# Patient Record
Sex: Female | Born: 1935 | Race: Black or African American | Hispanic: No | Smoking: Never smoker
Health system: Southern US, Community
[De-identification: ages and names within clinical notes are randomized; demographics above are authoritative.]

## PROBLEM LIST (undated history)

## (undated) DIAGNOSIS — I10 Essential (primary) hypertension: Secondary | ICD-10-CM

## (undated) DIAGNOSIS — K5900 Constipation, unspecified: Secondary | ICD-10-CM

## (undated) DIAGNOSIS — E119 Type 2 diabetes mellitus without complications: Secondary | ICD-10-CM

## (undated) DIAGNOSIS — F0390 Unspecified dementia without behavioral disturbance: Secondary | ICD-10-CM

## (undated) HISTORY — PX: CHOLECYSTECTOMY: SHX55

## (undated) HISTORY — PX: ABDOMINAL HYSTERECTOMY: SHX81

## (undated) HISTORY — PX: APPENDECTOMY: SHX54

---

## 1998-06-26 ENCOUNTER — Ambulatory Visit (HOSPITAL_COMMUNITY): Admission: RE | Admit: 1998-06-26 | Discharge: 1998-06-26 | Payer: Self-pay | Admitting: Family Medicine

## 1999-05-17 ENCOUNTER — Other Ambulatory Visit: Admission: RE | Admit: 1999-05-17 | Discharge: 1999-05-17 | Payer: Self-pay | Admitting: Gastroenterology

## 1999-05-17 ENCOUNTER — Encounter (INDEPENDENT_AMBULATORY_CARE_PROVIDER_SITE_OTHER): Payer: Self-pay | Admitting: Specialist

## 1999-07-15 ENCOUNTER — Ambulatory Visit (HOSPITAL_COMMUNITY): Admission: RE | Admit: 1999-07-15 | Discharge: 1999-07-15 | Payer: Self-pay | Admitting: Family Medicine

## 1999-07-15 ENCOUNTER — Encounter: Payer: Self-pay | Admitting: Family Medicine

## 2000-08-17 ENCOUNTER — Ambulatory Visit (HOSPITAL_COMMUNITY): Admission: RE | Admit: 2000-08-17 | Discharge: 2000-08-17 | Payer: Self-pay | Admitting: Family Medicine

## 2000-08-17 ENCOUNTER — Encounter: Payer: Self-pay | Admitting: Family Medicine

## 2001-05-29 ENCOUNTER — Emergency Department (HOSPITAL_COMMUNITY): Admission: EM | Admit: 2001-05-29 | Discharge: 2001-05-29 | Payer: Self-pay | Admitting: *Deleted

## 2001-12-20 ENCOUNTER — Ambulatory Visit (HOSPITAL_COMMUNITY): Admission: RE | Admit: 2001-12-20 | Discharge: 2001-12-20 | Payer: Self-pay | Admitting: Family Medicine

## 2001-12-20 ENCOUNTER — Encounter: Payer: Self-pay | Admitting: Family Medicine

## 2003-01-10 ENCOUNTER — Ambulatory Visit (HOSPITAL_COMMUNITY): Admission: RE | Admit: 2003-01-10 | Discharge: 2003-01-10 | Payer: Self-pay | Admitting: Family Medicine

## 2003-01-10 ENCOUNTER — Encounter: Payer: Self-pay | Admitting: Family Medicine

## 2004-04-28 ENCOUNTER — Ambulatory Visit (HOSPITAL_COMMUNITY): Admission: RE | Admit: 2004-04-28 | Discharge: 2004-04-28 | Payer: Self-pay | Admitting: Family Medicine

## 2004-09-29 ENCOUNTER — Ambulatory Visit: Payer: Self-pay

## 2004-10-27 ENCOUNTER — Ambulatory Visit: Payer: Self-pay

## 2005-05-16 ENCOUNTER — Ambulatory Visit (HOSPITAL_COMMUNITY): Admission: RE | Admit: 2005-05-16 | Discharge: 2005-05-16 | Payer: Self-pay | Admitting: *Deleted

## 2006-05-18 ENCOUNTER — Ambulatory Visit (HOSPITAL_COMMUNITY): Admission: RE | Admit: 2006-05-18 | Discharge: 2006-05-18 | Payer: Self-pay | Admitting: Podiatry

## 2007-05-21 ENCOUNTER — Ambulatory Visit (HOSPITAL_COMMUNITY): Admission: RE | Admit: 2007-05-21 | Discharge: 2007-05-21 | Payer: Self-pay | Admitting: Podiatry

## 2008-07-15 ENCOUNTER — Ambulatory Visit: Payer: Self-pay | Admitting: Gastroenterology

## 2008-07-22 ENCOUNTER — Ambulatory Visit (HOSPITAL_COMMUNITY): Admission: RE | Admit: 2008-07-22 | Discharge: 2008-07-22 | Payer: Self-pay | Admitting: Family Medicine

## 2008-09-03 ENCOUNTER — Emergency Department (HOSPITAL_COMMUNITY): Admission: EM | Admit: 2008-09-03 | Discharge: 2008-09-03 | Payer: Self-pay | Admitting: Emergency Medicine

## 2010-10-05 ENCOUNTER — Emergency Department (HOSPITAL_COMMUNITY): Admission: EM | Admit: 2010-10-05 | Discharge: 2010-10-05 | Payer: Self-pay | Admitting: Emergency Medicine

## 2010-10-10 ENCOUNTER — Emergency Department: Payer: Self-pay | Admitting: Unknown Physician Specialty

## 2010-12-12 ENCOUNTER — Encounter: Payer: Self-pay | Admitting: Family Medicine

## 2011-03-01 ENCOUNTER — Ambulatory Visit: Payer: Self-pay | Admitting: Family Medicine

## 2011-03-11 ENCOUNTER — Ambulatory Visit: Payer: Self-pay | Admitting: Family Medicine

## 2013-05-11 ENCOUNTER — Emergency Department (HOSPITAL_COMMUNITY)
Admission: EM | Admit: 2013-05-11 | Discharge: 2013-05-11 | Disposition: A | Discharge status: Home / Self Care | Payer: Medicare PPO | Attending: Emergency Medicine | Admitting: Emergency Medicine

## 2013-05-11 ENCOUNTER — Encounter (HOSPITAL_COMMUNITY): Payer: Self-pay | Admitting: *Deleted

## 2013-05-11 ENCOUNTER — Emergency Department (HOSPITAL_COMMUNITY): Payer: Medicare PPO

## 2013-05-11 DIAGNOSIS — Z7982 Long term (current) use of aspirin: Secondary | ICD-10-CM | POA: Insufficient documentation

## 2013-05-11 DIAGNOSIS — E860 Dehydration: Secondary | ICD-10-CM | POA: Insufficient documentation

## 2013-05-11 DIAGNOSIS — R4182 Altered mental status, unspecified: Secondary | ICD-10-CM | POA: Insufficient documentation

## 2013-05-11 DIAGNOSIS — E119 Type 2 diabetes mellitus without complications: Secondary | ICD-10-CM | POA: Insufficient documentation

## 2013-05-11 DIAGNOSIS — I10 Essential (primary) hypertension: Secondary | ICD-10-CM | POA: Insufficient documentation

## 2013-05-11 DIAGNOSIS — R42 Dizziness and giddiness: Secondary | ICD-10-CM

## 2013-05-11 DIAGNOSIS — Z79899 Other long term (current) drug therapy: Secondary | ICD-10-CM | POA: Insufficient documentation

## 2013-05-11 DIAGNOSIS — R55 Syncope and collapse: Secondary | ICD-10-CM | POA: Insufficient documentation

## 2013-05-11 DIAGNOSIS — M542 Cervicalgia: Secondary | ICD-10-CM | POA: Insufficient documentation

## 2013-05-11 DIAGNOSIS — I951 Orthostatic hypotension: Secondary | ICD-10-CM | POA: Insufficient documentation

## 2013-05-11 DIAGNOSIS — M5030 Other cervical disc degeneration, unspecified cervical region: Secondary | ICD-10-CM | POA: Insufficient documentation

## 2013-05-11 DIAGNOSIS — M47812 Spondylosis without myelopathy or radiculopathy, cervical region: Secondary | ICD-10-CM

## 2013-05-11 DIAGNOSIS — R0789 Other chest pain: Secondary | ICD-10-CM | POA: Insufficient documentation

## 2013-05-11 DIAGNOSIS — Z8659 Personal history of other mental and behavioral disorders: Secondary | ICD-10-CM | POA: Insufficient documentation

## 2013-05-11 HISTORY — DX: Type 2 diabetes mellitus without complications: E11.9

## 2013-05-11 HISTORY — DX: Essential (primary) hypertension: I10

## 2013-05-11 HISTORY — DX: Unspecified dementia, unspecified severity, without behavioral disturbance, psychotic disturbance, mood disturbance, and anxiety: F03.90

## 2013-05-11 LAB — URINALYSIS, ROUTINE W REFLEX MICROSCOPIC
Bilirubin Urine: NEGATIVE
Hgb urine dipstick: NEGATIVE
Nitrite: NEGATIVE
Urobilinogen, UA: 0.2 mg/dL (ref 0.0–1.0)
pH: 7 (ref 5.0–8.0)

## 2013-05-11 LAB — CBC WITH DIFFERENTIAL/PLATELET
Basophils Absolute: 0 10*3/uL (ref 0.0–0.1)
Hemoglobin: 11.6 g/dL — ABNORMAL LOW (ref 12.0–15.0)
Lymphocytes Relative: 24 % (ref 12–46)
MCHC: 31.8 g/dL (ref 30.0–36.0)
MCV: 80 fL (ref 78.0–100.0)
Monocytes Relative: 6 % (ref 3–12)
Neutro Abs: 6.4 10*3/uL (ref 1.7–7.7)
Neutrophils Relative %: 68 % (ref 43–77)
Platelets: 296 10*3/uL (ref 150–400)
RDW: 14.9 % (ref 11.5–15.5)

## 2013-05-11 LAB — BASIC METABOLIC PANEL
CO2: 28 mEq/L (ref 19–32)
Creatinine, Ser: 0.69 mg/dL (ref 0.50–1.10)

## 2013-05-11 LAB — TROPONIN I: Troponin I: <0.3 ng/mL (ref <0.30)

## 2013-05-11 MED ORDER — SODIUM CHLORIDE 0.9 % IV BOLUS (SEPSIS)
1000.0000 mL | Freq: Once | INTRAVENOUS | Status: AC
  Administered 2013-05-11: 1000 mL via INTRAVENOUS

## 2013-05-11 NOTE — ED Provider Notes (Signed)
History    This chart was scribed for Ward Givens, MD, by Frederik Pear, ED scribe. The patient was seen in room APA16A/APA16A and the patient's care was started at 1459.    CSN: [id number]  Arrival date & time 05/11/13  1422   First MD Initiated Contact with Patient 05/11/13 1459      Chief Complaint  Patient presents with  . Dizziness  . Altered Mental Status    (Consider location/radiation/quality/duration/timing/severity/associated sxs/prior treatment) The history is provided by the patient and medical records. No language interpreter was used.   LEVEL 5 CAVEAT FOR DEMENTIA  HPI Comments: Ashlee Barron is a 79 y.o. female with a h/o of mild dementia who presents to the Emergency Department complaining of altered mental that has been present for the last few days. She states that her mother has been asking her questions such as "Have you seen [place], he hasn't come home?" and "It's not like him not to come home." [place] is her husband who has been deceased for 10 years. She reports that she brought her to the ED today after she got out of the shower and found her mother sprawled out on the couch complaining of dizziness and central CP. She stated that she felt like the was going to pass out. She reports that she was not diaphoretic at the time, but seemed lethargic and only appeared to be looking up at the ceiling. She was concerned so she ran across the street to get her aunt, who is a retired Engineer, civil (consulting), and she took BP and CBG. Both were normal. She states that she doesn't feel that her mother seemed to be herself until they arrived in the ED, and she has been gradually improving since their arrival. In ED, the pt complains of constant neck pain that began 2 weeks ago.PT does not recall these events.  She denies falling or hitting her head. She denies complaining previously about CP. She reports a h/o of indigestion.  Patient also has been complaining of pain in her neck for the past 2  weeks. He denies any falling. Patient seems to think is from sleeping on her pillow.  Her daughter states that she has an brain MRI of her brain in 2 days that was scheduled by her neurologist Dr. Malvin Johns in [place] to analyze her specific type of dementia.   She is a nonsmoker who does not consume ETOH. 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 Jola Babinski in [place].  Past Medical History  Diagnosis Date  . Dementia   . Hypertension   . Diabetes mellitus without complication     Past Surgical History  Procedure Laterality Date  . Abdominal hysterectomy    . Cholecystectomy    . Appendectomy      No family history on file.  History  Substance Use Topics  . Smoking status: Never Smoker   . Smokeless tobacco: Not on file  . Alcohol Use: No  lives at home Lives with daughter Retired Licensed conveyancer in [place]  OB History   Grav Para Term Preterm Abortions TAB SAB Ect Mult Living                  Review of Systems  Neurological:       Altered Mental Status  All other systems reviewed and are negative.    Allergies  Review of patient's allergies indicates no known allergies.  Home Medications  Current Outpatient Rx  Name  Route  Sig  Dispense  Refill  . amLODipine-benazepril (LOTREL) 5-20 MG per capsule   Oral   Take 1 capsule by mouth daily.         Marland Kitchen aspirin EC 81 MG tablet   Oral   Take 81 mg by mouth daily.         Marland Kitchen atorvastatin (LIPITOR) 40 MG tablet   Oral   Take 40 mg by mouth daily.         Marland Kitchen donepezil (ARICEPT) 10 MG tablet   Oral   Take 10 mg by mouth at bedtime.         . mirtazapine (REMERON) 15 MG tablet   Oral   Take 15 mg by mouth at bedtime.         . Multiple Vitamin (MULTIVITAMIN) tablet   Oral   Take 1 tablet by mouth daily.         . sitaGLIPtan-metformin (JANUMET) 50-500 MG per tablet   Oral   Take 1 tablet by mouth 2 (two) times daily with a meal.            BP 135/83  Pulse 91  Temp(Src) 97.1 F (36.2 C) (Oral)  Resp 18  Ht 5\' 6"  (1.676 m)  Wt 159 lb (72.122 kg)  BMI 25.68 kg/m2  SpO2 96%  Vital signs normal   Orthostatic VS showed her blood pressure dropped to 90/51 with heart rate 105 on sitting and when she stood up her pulse jumped to 123 and blood pressure was 116/83   Physical Exam  Nursing note and vitals reviewed. Constitutional: She appears well-developed and well-nourished.  Non-toxic appearance. She does not appear ill. No distress.  HENT:  Head: Normocephalic and atraumatic.  Right Ear: External ear normal.  Left Ear: External ear normal.  Nose: Nose normal. No mucosal edema or rhinorrhea.  Mouth/Throat: Oropharynx is clear and moist and mucous membranes are normal. No dental abscesses or edematous.  Eyes: Conjunctivae and EOM are normal. Pupils are equal, round, and reactive to light.  Neck: Normal range of motion and full passive range of motion without pain. Neck supple.  Patient has diffuse tenderness of her cervical spine and the immediate paraspinous muscles that reproduces her complaints of pain.  Cardiovascular: Normal rate, regular rhythm and normal heart sounds.  Exam reveals no gallop and no friction rub.   No murmur heard. Pulmonary/Chest: Effort normal and breath sounds normal. No respiratory distress. She has no wheezes. She has no rhonchi. She has no rales. She exhibits no tenderness and no crepitus.  Abdominal: Soft. Normal appearance and bowel sounds are normal. She exhibits no distension. There is no tenderness. There is no rebound and no guarding.  Musculoskeletal: Normal range of motion. She exhibits no edema and no tenderness.  Moves all extremities well.   Neurological: She is alert. She has normal strength. No cranial nerve deficit.  Skin: Skin is warm, dry and intact. No rash noted. No erythema. No pallor.  Psychiatric: She has a normal mood and affect. Her speech is normal and behavior is  normal. Her mood appears not anxious.    ED Course  Procedures (including critical care time)  Medications  sodium chloride 0.9 % bolus 1,000 mL (0 mLs Intravenous Stopped 05/11/13 2047)     DIAGNOSTIC STUDIES: Oxygen Saturation is 96% on room air, normal by my interpretation.    COORDINATION OF CARE:  15:57- Discussed planned course of treatment with the  patient, including a CBC with differential, basic metabolic panel, UA with microscopic reflex, EKG, and Troponin I, who is agreeable at this time.  Daughter had mentioned she was getting frustrated not being able to find help to take care of her mother. Felissa, ACT who is actually a Child psychotherapist, talked to the daughter to help her get the resources that she needs.  Patient was given 1 L of IV fluids after which her orthostatic hypotension had resolved. She feels improved and ready to go home.   Results for orders placed during the hospital encounter of 05/11/13  CBC WITH DIFFERENTIAL      Result Value Range   WBC 9.5  4.0 - 10.5 K/uL   RBC 4.56  3.87 - 5.11 MIL/uL   Hemoglobin 11.6 (*) 12.0 - 15.0 g/dL   HCT 16.1  09.6 - 04.5 %   MCV 80.0  78.0 - 100.0 fL   MCH 25.4 (*) 26.0 - 34.0 pg   MCHC 31.8  30.0 - 36.0 g/dL   RDW 40.9  81.1 - 91.4 %   Platelets 296  150 - 400 K/uL   Neutrophils Relative % 68  43 - 77 %   Neutro Abs 6.4  1.7 - 7.7 K/uL   Lymphocytes Relative 24  12 - 46 %   Lymphs Abs 2.3  0.7 - 4.0 K/uL   Monocytes Relative 6  3 - 12 %   Monocytes Absolute 0.6  0.1 - 1.0 K/uL   Eosinophils Relative 2  0 - 5 %   Eosinophils Absolute 0.2  0.0 - 0.7 K/uL   Basophils Relative 0  0 - 1 %   Basophils Absolute 0.0  0.0 - 0.1 K/uL  BASIC METABOLIC PANEL      Result Value Range   Sodium 143  135 - 145 mEq/L   Potassium 4.2  3.5 - 5.1 mEq/L   Chloride 100  96 - 112 mEq/L   CO2 28  19 - 32 mEq/L   Glucose, Bld 100 (*) 70 - 99 mg/dL   BUN 16  6 - 23 mg/dL   Creatinine, Ser 7.82  0.50 - 1.10 mg/dL   Calcium 95.6  8.4  - 10.5 mg/dL   GFR calc non Af Amer 78 (*) >90 mL/min   GFR calc Af Amer >90  >90 mL/min  URINALYSIS, ROUTINE W REFLEX MICROSCOPIC      Result Value Range   Color, Urine YELLOW  YELLOW   APPearance CLEAR  CLEAR   Specific Gravity, Urine 1.015  1.005 - 1.030   pH 7.0  5.0 - 8.0   Glucose, UA NEGATIVE  NEGATIVE mg/dL   Hgb urine dipstick NEGATIVE  NEGATIVE   Bilirubin Urine NEGATIVE  NEGATIVE   Ketones, ur NEGATIVE  NEGATIVE mg/dL   Protein, ur NEGATIVE  NEGATIVE mg/dL   Urobilinogen, UA 0.2  0.0 - 1.0 mg/dL   Nitrite NEGATIVE  NEGATIVE   Leukocytes, UA NEGATIVE  NEGATIVE  TROPONIN I      Result Value Range   Troponin I <0.30  <0.30 ng/mL   Laboratory interpretation all normal except except mild anemia  Ct Cervical Spine Wo Contrast  05/11/2013   *RADIOLOGY REPORT*  Clinical Data: Neck pain for 3 months.  No known injury. History of dementia, hypertension, and diabetes.  CT CERVICAL SPINE WITHOUT CONTRAST  Technique:  Multidetector CT imaging of the cervical spine was performed. Multiplanar CT image reconstructions were also generated.  Comparison: None.  Findings: Anatomic alignment  except for  3 mm retrolisthesis C5-C6. Skeletal osteopenia.  No compression fracture or osseous destructive lesion. No prevertebral soft tissue swelling.  No signs of disc space infection.  Osteopenia is noted.  Mild calcific pannus.  Shallow calcific protrusion C5-6.  Bilateral facet arthropathy at multiple levels.  No neck masses.  Vascular calcification.  No visible lung apex pathology.  IMPRESSION: Cervical spondylosis, worst at C5-C6.  No acute findings.   Original Report Authenticated By: Davonna Belling, M.D.      Date: 05/11/2013  Rate: 99  Rhythm: normal sinus rhythm  QRS Axis: normal  Intervals: normal  ST/T Wave abnormalities: normal  Conduction Disutrbances:none  Narrative Interpretation: PRWP  Old EKG Reviewed: none available    1. Dizziness   2. Near syncope   3. Orthostatic hypotension    4. Dehydration   5. Degenerative arthritis of cervical spine    Plan discharge  Devoria Albe, MD, FACEP    MDM  patient has had poor appetite and moderate orthostatic hypotension which probably explains the episode today at home where she was weak. It improved with IV fluids. She had no other acute abnormality on her tests.    I personally performed the services described in this documentation, which was scribed in my presence. The recorded information has been reviewed and considered.   Devoria Albe, MD, Armando Gang         Ward Givens, MD 05/11/13 2111
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
## 2013-05-11 NOTE — ED Notes (Signed)
Neck pain x 2 wks.  Dizziness and increased confusion "for a while."  Family states pt wakes up in the middle of night asking for her dead husband and dead sister.  States pt is repeating herself throughout the day.  Pt alert and oriented x 3 at this time.  Unable to tell date.

## 2013-05-13 ENCOUNTER — Ambulatory Visit: Payer: Self-pay | Admitting: Neurology

## 2013-12-18 ENCOUNTER — Emergency Department: Payer: Self-pay | Admitting: Emergency Medicine

## 2013-12-18 LAB — COMPREHENSIVE METABOLIC PANEL
ALBUMIN: 3.9 g/dL (ref 3.4–5.0)
ALK PHOS: 221 U/L — AB
ANION GAP: 7 (ref 7–16)
BILIRUBIN TOTAL: 0.1 mg/dL — AB (ref 0.2–1.0)
BUN: 11 mg/dL (ref 7–18)
CALCIUM: 9.7 mg/dL (ref 8.5–10.1)
CREATININE: 0.81 mg/dL (ref 0.60–1.30)
Chloride: 107 mmol/L (ref 98–107)
Co2: 25 mmol/L (ref 21–32)
EGFR (African American): >60
GLUCOSE: 134 mg/dL — AB (ref 65–99)
Osmolality: 279 (ref 275–301)
POTASSIUM: 4.3 mmol/L (ref 3.5–5.1)
SGOT(AST): 54 U/L — ABNORMAL HIGH (ref 15–37)
SGPT (ALT): 35 U/L (ref 12–78)
SODIUM: 139 mmol/L (ref 136–145)
TOTAL PROTEIN: 8.7 g/dL — AB (ref 6.4–8.2)

## 2013-12-18 LAB — URINALYSIS, COMPLETE
BILIRUBIN, UR: NEGATIVE
Bacteria: NONE SEEN
Blood: NEGATIVE
Glucose,UR: NEGATIVE mg/dL (ref 0–75)
KETONE: NEGATIVE
LEUKOCYTE ESTERASE: NEGATIVE
Nitrite: NEGATIVE
PH: 6 (ref 4.5–8.0)
PROTEIN: NEGATIVE
RBC,UR: NONE SEEN /HPF (ref 0–5)
Specific Gravity: 1.008 (ref 1.003–1.030)
WBC UR: <1 /HPF (ref 0–5)

## 2013-12-18 LAB — CBC
HCT: 35.6 % (ref 35.0–47.0)
HGB: 11.4 g/dL — AB (ref 12.0–16.0)
MCH: 26.1 pg (ref 26.0–34.0)
MCHC: 32 g/dL (ref 32.0–36.0)
MCV: 82 fL (ref 80–100)
Platelet: 207 10*3/uL (ref 150–440)
RBC: 4.37 10*6/uL (ref 3.80–5.20)
RDW: 15.7 % — ABNORMAL HIGH (ref 11.5–14.5)
WBC: 6.9 10*3/uL (ref 3.6–11.0)

## 2013-12-18 LAB — ACETAMINOPHEN LEVEL: Acetaminophen: <2 ug/mL

## 2013-12-18 LAB — DRUG SCREEN, URINE

## 2013-12-18 LAB — SALICYLATE LEVEL

## 2013-12-18 LAB — ETHANOL: Ethanol %: <0.003 % (ref 0.000–0.080)
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
## 2016-08-09 ENCOUNTER — Ambulatory Visit (INDEPENDENT_AMBULATORY_CARE_PROVIDER_SITE_OTHER): Payer: Medicare Other | Admitting: Urology

## 2016-08-09 DIAGNOSIS — N312 Flaccid neuropathic bladder, not elsewhere classified: Secondary | ICD-10-CM

## 2016-08-12 ENCOUNTER — Ambulatory Visit (INDEPENDENT_AMBULATORY_CARE_PROVIDER_SITE_OTHER): Payer: Medicare Other | Admitting: Urology

## 2016-08-12 DIAGNOSIS — N312 Flaccid neuropathic bladder, not elsewhere classified: Secondary | ICD-10-CM | POA: Diagnosis not present

## 2016-08-16 ENCOUNTER — Encounter (HOSPITAL_COMMUNITY): Payer: Self-pay

## 2016-08-16 ENCOUNTER — Emergency Department (HOSPITAL_COMMUNITY)
Admission: EM | Admit: 2016-08-16 | Discharge: 2016-08-16 | Disposition: A | Discharge status: Home / Self Care | Payer: Medicare Other | Attending: Emergency Medicine | Admitting: Emergency Medicine

## 2016-08-16 DIAGNOSIS — R300 Dysuria: Secondary | ICD-10-CM | POA: Diagnosis present

## 2016-08-16 DIAGNOSIS — N390 Urinary tract infection, site not specified: Secondary | ICD-10-CM | POA: Diagnosis not present

## 2016-08-16 DIAGNOSIS — I10 Essential (primary) hypertension: Secondary | ICD-10-CM | POA: Insufficient documentation

## 2016-08-16 DIAGNOSIS — R339 Retention of urine, unspecified: Secondary | ICD-10-CM

## 2016-08-16 DIAGNOSIS — Z79899 Other long term (current) drug therapy: Secondary | ICD-10-CM | POA: Diagnosis not present

## 2016-08-16 DIAGNOSIS — E119 Type 2 diabetes mellitus without complications: Secondary | ICD-10-CM | POA: Diagnosis not present

## 2016-08-16 LAB — URINE MICROSCOPIC-ADD ON

## 2016-08-16 LAB — URINALYSIS, ROUTINE W REFLEX MICROSCOPIC
BILIRUBIN URINE: NEGATIVE
Glucose, UA: NEGATIVE mg/dL
HGB URINE DIPSTICK: NEGATIVE
Ketones, ur: NEGATIVE mg/dL
Nitrite: NEGATIVE
PH: 6 (ref 5.0–8.0)
SPECIFIC GRAVITY, URINE: 1.025 (ref 1.005–1.030)

## 2016-08-16 MED ORDER — CEPHALEXIN 500 MG PO CAPS: 500.0000 mg | ORAL_CAPSULE | Freq: Four times a day (QID) | ORAL | 0 refills | Status: AC

## 2016-08-16 MED ORDER — CEPHALEXIN 500 MG PO CAPS
500.0000 mg | ORAL_CAPSULE | Freq: Once | ORAL | Status: AC
Start: 2016-08-16 — End: 2016-08-16
  Administered 2016-08-16: 500 mg via ORAL
  Filled 2016-08-16: qty 1

## 2016-08-16 MED ORDER — ALPRAZOLAM 0.5 MG PO TABS
0.2500 mg | ORAL_TABLET | Freq: Once | ORAL | Status: AC
  Administered 2016-08-16: 0.25 mg via ORAL
  Filled 2016-08-16: qty 1

## 2016-08-16 NOTE — Discharge Instructions (Signed)
Follow-up with a physician in 2-3 days for repeat exam and check-up, with possible foley removal

## 2016-08-16 NOTE — ED Triage Notes (Signed)
Patient from a group home. Caregiver states patient pulled her catheter out today at noon. Patient c/o abdominal pain, and states patient has not urinated since removing foley.

## 2016-08-16 NOTE — ED Provider Notes (Signed)
[facility]-EM[facility]ENCY DEPT Provider Note   CSN: [id number] Arrival date & time: 08/16/16  2026     History   Chief Complaint Chief Complaint  Patient presents with  . Dysuria    HPI Ashlee Barron is a 82 y.o. female.  Pt has a hx of urinary retention and has an indwelling foley catheter.  She pulled it out around noon, and has not urinated since removing the foley.  The pt was brought here for a catheter re-insertion.  The pt has dementia and is unable to give any hx.  The caregiver that is with her gives the history.      Past Medical History:  Diagnosis Date  . Dementia   . Diabetes mellitus without complication (HCC)   . Hypertension     There are no active problems to display for this patient.   Past Surgical History:  Procedure Laterality Date  . ABDOMINAL HYSTERECTOMY    . APPENDECTOMY    . CHOLECYSTECTOMY      OB History    No data available       Home Medications    Prior to Admission medications   Medication Sig Start Date End Date Taking? Authorizing Provider  acetaminophen (TYLENOL) 500 MG tablet Take 500 mg by mouth 3 (three) times daily.   Yes Historical Provider, MD  ALPRAZolam (XANAX) 0.25 MG tablet Take 0.25-0.5 mg by mouth 2 (two) times daily. Take one tablet in the morning and two tablets at bedtime   Yes Historical Provider, MD  amLODipine-benazepril (LOTREL) 5-10 MG capsule Take 1 capsule by mouth daily.   Yes Historical Provider, MD  diphenhydrAMINE (BENADRYL) 25 MG tablet Take 25 mg by mouth every 8 (eight) hours as needed for itching.   Yes Historical Provider, MD  divalproex (DEPAKOTE) 250 MG DR tablet Take 250 mg by mouth 2 (two) times daily.   Yes Historical Provider, MD  donepezil (ARICEPT) 10 MG tablet Take 10 mg by mouth at bedtime.   Yes Historical Provider, MD  ferrous sulfate 325 (65 FE) MG tablet Take 325 mg by mouth daily with breakfast.   Yes Historical Provider, MD  Folic Acid 0.8 MG CAPS Take 0.8 mg by mouth daily.   Yes  Historical Provider, MD  hydrocortisone cream 1 % Apply 1 application topically daily as needed for itching (where rash is noted).   Yes Historical Provider, MD  levofloxacin (LEVAQUIN) 500 MG tablet Take 500 mg by mouth daily.   Yes Historical Provider, MD  Melatonin 3 MG TABS Take 3 mg by mouth 3 (three) times daily.   Yes Historical Provider, MD  mirtazapine (REMERON) 30 MG tablet Take 30 mg by mouth at bedtime.    Yes Historical Provider, MD  Multiple Vitamin (MULTIVITAMIN WITH MINERALS) TABS tablet Take 1 tablet by mouth daily.   Yes Historical Provider, MD  thiamine (VITAMIN B-1) 100 MG tablet Take 100 mg by mouth daily.   Yes Historical Provider, MD  traMADol (ULTRAM) 50 MG tablet Take 50 mg by mouth 2 (two) times daily as needed for moderate pain or severe pain.   Yes Historical Provider, MD  vitamin C (ASCORBIC ACID) 500 MG tablet Take 500 mg by mouth daily.   Yes Historical Provider, MD    Family History History reviewed. No pertinent family history.  Social History Social History  Substance Use Topics  . Smoking status: Never Smoker  . Smokeless tobacco: Never Used  . Alcohol use No     Allergies  Review of patient's allergies indicates no known allergies.   Review of Systems Review of Systems  Genitourinary: Positive for difficulty urinating.  All other systems reviewed and are negative.    Physical Exam Updated Vital Signs BP 137/92 (BP Location: Left Arm)   Pulse 87   Temp 98.2 F (36.8 C) (Oral)   Resp 20   Ht 5\' 9"  (1.753 m)   Wt 160 lb (72.6 kg)   SpO2 100%   BMI 23.63 kg/m   Physical Exam  Constitutional: She appears well-developed and well-nourished.  HENT:  Head: Normocephalic and atraumatic.  Right Ear: External ear normal.  Left Ear: External ear normal.  Nose: Nose normal.  Mouth/Throat: Oropharynx is clear and moist.  Eyes: Conjunctivae and EOM are normal. Pupils are equal, round, and reactive to light.  Neck: Normal range of motion.  Neck supple.  Cardiovascular: Normal rate, regular rhythm, normal heart sounds and intact distal pulses.   Pulmonary/Chest: Effort normal and breath sounds normal.  Abdominal: Soft. Bowel sounds are normal. There is tenderness in the suprapubic area.  Musculoskeletal: Normal range of motion.  Neurological: She is alert.  Oriented per norm (person)  Skin: Skin is warm.  Psychiatric: She has a normal mood and affect. Her behavior is normal. Judgment and thought content normal.  Nursing note and vitals reviewed.    ED Treatments / Results  Labs (all labs ordered are listed, but only abnormal results are displayed) Labs Reviewed  URINALYSIS, ROUTINE W REFLEX MICROSCOPIC (NOT AT [facility])    EKG  EKG Interpretation None       Radiology No results found.  Procedures Procedures (including critical care time)  Medications Ordered in ED Medications - No data to display   Initial Impression / Assessment and Plan / ED Course  I have reviewed the triage vital signs and the nursing notes.  Pertinent labs & imaging results that were available during my care of the patient were reviewed by me and considered in my medical decision making (see chart for details).  Clinical Course   Plan to replace to foley catheter and send urine to the lab.  Foley placed, and UA is pending.  Pt signed out to Dr. Erma HeritageIsaacs.  Final Clinical Impressions(s) / ED Diagnoses   Final diagnoses:  Urinary retention    New Prescriptions New Prescriptions   No medications on file     Jacalyn LefevreJulie Sparkles Mcneely, MD 08/17/16 1230

## 2016-08-17 NOTE — ED Provider Notes (Signed)
Assumed care from Dr. Particia NearingHaviland at 10 PM. Briefly, the patient is a 82 yo F with chronic urinary retention who p/w urinary retention after pulling foley. Foley replaced here, awaiting UA. HDS, AF, at baseline.   Labs Reviewed  URINALYSIS, ROUTINE W REFLEX MICROSCOPIC (NOT AT [facility]) - Abnormal; Notable for the following:       Result Value   Protein, ur TRACE (*)    Leukocytes, UA MODERATE (*)    All other components within normal limits  URINE MICROSCOPIC-ADD ON - Abnormal; Notable for the following:    Squamous Epithelial / LPF 0-5 (*)    Bacteria, UA MANY (*)    All other components within normal limits  URINE CULTURE    Course of Care: -UA shows TNTC WBCs, many bacteria. No prior UCx. Will treat with keflex, outpt f/u. No signs of pyelo or systemic illness. D/c back to facility.  Clinical Impression: 1. UTI (lower urinary tract infection)   2. Urinary retention     Disposition: Discharge  Condition: Good  I have discussed the results, Dx and Tx plan with the pt(& family if present). He/she/they expressed understanding and agree(s) with the plan. Discharge instructions discussed at great length. Strict return precautions discussed and pt &/or family have verbalized understanding of the instructions. No further questions at time of discharge.    Discharge Medication List as of 08/16/2016 10:48 PM    START taking these medications   Details  cephALEXin (KEFLEX) 500 MG capsule Take 1 capsule (500 mg total) by mouth 4 (four) times daily., Starting Tue 08/16/2016, Until Tue 08/23/2016, Print        [facility] Up: [facility] [address] [place] [id number] [phone] Schedule an appointment as soon as possible for a visit  Follow-up with a primary doctor in 3-5 days. If you do not have a doctor, you can call this number to set up an appointment with one.       Shaune Pollackameron Dayanara Sherrill, MD 08/17/16 [phone]

## 2016-08-19 LAB — URINE CULTURE: Culture: >=100000 — AB

## 2016-08-20 ENCOUNTER — Telehealth (HOSPITAL_BASED_OUTPATIENT_CLINIC_OR_DEPARTMENT_OTHER): Payer: Self-pay

## 2016-08-20 NOTE — Progress Notes (Signed)
ED Antimicrobial Stewardship Positive Culture Follow Up   Ashlee Barron is an 82 y.o. female who [facility] with a chief complaint of  Chief Complaint  Patient presents with  . Dysuria    Recent Results (from the past 720 hour(s))  Urine culture     Status: Abnormal   Collection Time: 08/16/16 10:40 PM  Result Value Ref Range Status   Specimen Description URINE, CATHETERIZED  Final   Special Requests NONE  Final   Culture (A)  Final    >=100,000 COLONIES/mL ESCHERICHIA COLI Confirmed Extended Spectrum Beta-Lactamase Producer (ESBL) Performed at [facility]    Report Status 08/19/2016 FINAL  Final   Organism ID, Bacteria ESCHERICHIA COLI (A)  Final      Susceptibility   Escherichia coli - MIC*    AMPICILLIN >=32 RESISTANT Resistant     CEFAZOLIN >=64 RESISTANT Resistant     CEFTRIAXONE >=64 RESISTANT Resistant     CIPROFLOXACIN >=4 RESISTANT Resistant     GENTAMICIN <=1 SENSITIVE Sensitive     IMIPENEM <=0.25 SENSITIVE Sensitive     NITROFURANTOIN 64 INTERMEDIATE Intermediate     TRIMETH/SULFA >=320 RESISTANT Resistant     AMPICILLIN/SULBACTAM 16 INTERMEDIATE Intermediate     PIP/TAZO <=4 SENSITIVE Sensitive     Extended ESBL POSITIVE Resistant     * >=100,000 COLONIES/mL ESCHERICHIA COLI    [x]  Treated with cephalexin and levofloxacin, organism resistant to prescribed antimicrobial  New antibiotic prescription: Call patient and find out if they are still having symptoms. If so, send RX for fosfomycin 3g PO x1.  ED Provider: Audry Piliyler Mohr, PA-C  [name], PharmD. PGY-2 Infectious Diseases Pharmacy Resident Pager: [phone] 08/20/2016, 8:57 AM

## 2016-08-20 NOTE — Telephone Encounter (Signed)
Pt with + UC. [facility]. UC reort faxed to this facility: [phone]

## 2016-09-09 ENCOUNTER — Encounter (HOSPITAL_COMMUNITY): Payer: Self-pay | Admitting: Internal Medicine

## 2016-09-09 ENCOUNTER — Observation Stay (HOSPITAL_COMMUNITY): Payer: Medicare Other

## 2016-09-09 ENCOUNTER — Observation Stay (HOSPITAL_COMMUNITY)
Admission: AD | Admit: 2016-09-09 | Discharge: 2016-09-12 | Disposition: A | Discharge status: Home / Self Care | Payer: Medicare Other | Source: Ambulatory Visit | Attending: Internal Medicine | Admitting: Internal Medicine

## 2016-09-09 DIAGNOSIS — E119 Type 2 diabetes mellitus without complications: Secondary | ICD-10-CM | POA: Diagnosis not present

## 2016-09-09 DIAGNOSIS — R945 Abnormal results of liver function studies: Secondary | ICD-10-CM | POA: Diagnosis not present

## 2016-09-09 DIAGNOSIS — I10 Essential (primary) hypertension: Secondary | ICD-10-CM | POA: Insufficient documentation

## 2016-09-09 DIAGNOSIS — K5909 Other constipation: Secondary | ICD-10-CM | POA: Diagnosis present

## 2016-09-09 DIAGNOSIS — Z79899 Other long term (current) drug therapy: Secondary | ICD-10-CM | POA: Insufficient documentation

## 2016-09-09 DIAGNOSIS — E860 Dehydration: Secondary | ICD-10-CM | POA: Diagnosis present

## 2016-09-09 DIAGNOSIS — K5900 Constipation, unspecified: Secondary | ICD-10-CM | POA: Diagnosis not present

## 2016-09-09 LAB — URINE MICROSCOPIC-ADD ON

## 2016-09-09 LAB — COMPREHENSIVE METABOLIC PANEL
ALT: 15 U/L (ref 14–54)
ANION GAP: 10 (ref 5–15)
AST: 32 U/L (ref 15–41)
Albumin: 3.5 g/dL (ref 3.5–5.0)
Alkaline Phosphatase: 62 U/L (ref 38–126)
BUN: 26 mg/dL — ABNORMAL HIGH (ref 6–20)
CHLORIDE: 106 mmol/L (ref 101–111)
CO2: 26 mmol/L (ref 22–32)
Calcium: 9.2 mg/dL (ref 8.9–10.3)
Creatinine, Ser: 0.8 mg/dL (ref 0.44–1.00)
Glucose, Bld: 110 mg/dL — ABNORMAL HIGH (ref 65–99)
POTASSIUM: 3.4 mmol/L — AB (ref 3.5–5.1)
SODIUM: 142 mmol/L (ref 135–145)
Total Bilirubin: 0.4 mg/dL (ref 0.3–1.2)
Total Protein: 6.7 g/dL (ref 6.5–8.1)

## 2016-09-09 LAB — URINALYSIS, ROUTINE W REFLEX MICROSCOPIC
Glucose, UA: NEGATIVE mg/dL
Ketones, ur: 15 mg/dL — AB
NITRITE: NEGATIVE
PH: 6 (ref 5.0–8.0)
Protein, ur: 100 mg/dL — AB

## 2016-09-09 LAB — CBC
HCT: 31.8 % — ABNORMAL LOW (ref 36.0–46.0)
Hemoglobin: 10 g/dL — ABNORMAL LOW (ref 12.0–15.0)
MCH: 27.2 pg (ref 26.0–34.0)
MCHC: 31.4 g/dL (ref 30.0–36.0)
MCV: 86.4 fL (ref 78.0–100.0)
PLATELETS: 211 10*3/uL (ref 150–400)
RBC: 3.68 MIL/uL — AB (ref 3.87–5.11)
RDW: 14.3 % (ref 11.5–15.5)
WBC: 5.5 10*3/uL (ref 4.0–10.5)

## 2016-09-09 LAB — TSH: TSH: 0.797 u[IU]/mL (ref 0.350–4.500)

## 2016-09-09 LAB — MRSA PCR SCREENING: MRSA by PCR: NEGATIVE

## 2016-09-09 MED ORDER — BISACODYL 10 MG RE SUPP
10.0000 mg | Freq: Every day | RECTAL | Status: DC | PRN
  Administered 2016-09-11: 10 mg via RECTAL
  Filled 2016-09-09: qty 1

## 2016-09-09 MED ORDER — ACETAMINOPHEN 650 MG RE SUPP: 650.0000 mg | Freq: Four times a day (QID) | RECTAL | Status: DC | PRN

## 2016-09-09 MED ORDER — FOLIC ACID 1 MG PO TABS
1.0000 mg | ORAL_TABLET | Freq: Every day | ORAL | Status: DC
  Administered 2016-09-10 – 2016-09-12 (×3): 1 mg via ORAL
  Filled 2016-09-09 (×3): qty 1

## 2016-09-09 MED ORDER — DONEPEZIL HCL 5 MG PO TABS
10.0000 mg | ORAL_TABLET | Freq: Every day | ORAL | Status: DC
  Administered 2016-09-10: 10 mg via ORAL
  Filled 2016-09-09 (×2): qty 2

## 2016-09-09 MED ORDER — FLEET ENEMA 7-19 GM/118ML RE ENEM: 1.0000 | ENEMA | Freq: Once | RECTAL | Status: DC

## 2016-09-09 MED ORDER — VITAMIN B-1 100 MG PO TABS
100.0000 mg | ORAL_TABLET | Freq: Every day | ORAL | Status: DC
  Administered 2016-09-10 – 2016-09-12 (×3): 100 mg via ORAL
  Filled 2016-09-09 (×2): qty 1

## 2016-09-09 MED ORDER — INSULIN ASPART 100 UNIT/ML ~~LOC~~ SOLN
0.0000 [IU] | Freq: Three times a day (TID) | SUBCUTANEOUS | Status: DC
  Administered 2016-09-10: 1 [IU] via SUBCUTANEOUS

## 2016-09-09 MED ORDER — AMLODIPINE BESY-BENAZEPRIL HCL 5-10 MG PO CAPS: 1.0000 | ORAL_CAPSULE | Freq: Every day | ORAL | Status: DC

## 2016-09-09 MED ORDER — HALOPERIDOL LACTATE 5 MG/ML IJ SOLN
1.0000 mg | Freq: Two times a day (BID) | INTRAMUSCULAR | Status: DC | PRN
  Filled 2016-09-09: qty 1

## 2016-09-09 MED ORDER — ENOXAPARIN SODIUM 40 MG/0.4ML ~~LOC~~ SOLN
40.0000 mg | SUBCUTANEOUS | Status: DC
  Administered 2016-09-10: 40 mg via SUBCUTANEOUS
  Filled 2016-09-09 (×2): qty 0.4

## 2016-09-09 MED ORDER — FERROUS SULFATE 325 (65 FE) MG PO TABS
325.0000 mg | ORAL_TABLET | Freq: Every day | ORAL | Status: DC
  Administered 2016-09-10 – 2016-09-12 (×3): 325 mg via ORAL
  Filled 2016-09-09 (×4): qty 1

## 2016-09-09 MED ORDER — DIPHENHYDRAMINE HCL 25 MG PO CAPS: 25.0000 mg | ORAL_CAPSULE | Freq: Three times a day (TID) | ORAL | Status: DC | PRN

## 2016-09-09 MED ORDER — MELATONIN 3 MG PO TABS: 3.0000 mg | ORAL_TABLET | Freq: Three times a day (TID) | ORAL | Status: DC

## 2016-09-09 MED ORDER — ACETAMINOPHEN 325 MG PO TABS
650.0000 mg | ORAL_TABLET | Freq: Four times a day (QID) | ORAL | Status: DC | PRN
  Administered 2016-09-11: 650 mg via ORAL
  Filled 2016-09-09: qty 2

## 2016-09-09 MED ORDER — INSULIN ASPART 100 UNIT/ML ~~LOC~~ SOLN: 0.0000 [IU] | Freq: Every day | SUBCUTANEOUS | Status: DC

## 2016-09-09 MED ORDER — ALPRAZOLAM 0.5 MG PO TABS
0.5000 mg | ORAL_TABLET | Freq: Two times a day (BID) | ORAL | Status: DC
  Administered 2016-09-10 – 2016-09-12 (×4): 0.5 mg via ORAL
  Filled 2016-09-09 (×5): qty 1

## 2016-09-09 MED ORDER — SODIUM CHLORIDE 0.9 % IV SOLN: INTRAVENOUS | Status: AC

## 2016-09-09 MED ORDER — HALOPERIDOL 2 MG PO TABS
1.0000 mg | ORAL_TABLET | Freq: Every day | ORAL | Status: DC | PRN
  Administered 2016-09-11: 1 mg via ORAL
  Filled 2016-09-09: qty 1

## 2016-09-09 MED ORDER — VITAMIN C 500 MG PO TABS
500.0000 mg | ORAL_TABLET | Freq: Every day | ORAL | Status: DC
  Administered 2016-09-10 – 2016-09-12 (×3): 500 mg via ORAL
  Filled 2016-09-09 (×3): qty 1

## 2016-09-09 MED ORDER — ADULT MULTIVITAMIN W/MINERALS CH
1.0000 | ORAL_TABLET | Freq: Every day | ORAL | Status: DC
  Administered 2016-09-10 – 2016-09-12 (×3): 1 via ORAL
  Filled 2016-09-09 (×3): qty 1

## 2016-09-09 MED ORDER — DIVALPROEX SODIUM 250 MG PO DR TAB
250.0000 mg | DELAYED_RELEASE_TABLET | Freq: Two times a day (BID) | ORAL | Status: DC
  Administered 2016-09-10 – 2016-09-12 (×4): 250 mg via ORAL
  Filled 2016-09-09 (×5): qty 1

## 2016-09-09 MED ORDER — MIRTAZAPINE 30 MG PO TABS
30.0000 mg | ORAL_TABLET | Freq: Every day | ORAL | Status: DC
  Administered 2016-09-10: 30 mg via ORAL
  Filled 2016-09-09 (×2): qty 1

## 2016-09-09 NOTE — H&P (Addendum)
H&P   Patient Demographics:    Ashlee Barron, is a 82 y.o. female  MRN: [id number]   DOB - 31-Aug-1928  Admit Date - 09/09/2016  Outpatient Primary MD for the patient is No PCP Per Patient    Pearson Grippe M.D.  Referring MD/NP/PA:  Outpatient Specialists:      Patient coming from: alf  No chief complaint on file. Constipation, dehydration    HPI:    Jozelynn Danielson  is a 82 y.o. female, Dementia, Dm2, apparently has not had bm for the past 5-7 days. Even after miralax, colace and fleet enema x2.   Staff thought that she was dehydrated.  Pt will be admitted observation for refractory constipation, dehydration.     Review of systems:    In addition to the HPI above,  No Fever-chills, No Headache, No changes with Vision or hearing, No problems swallowing food or Liquids, No Chest pain, Cough or Shortness of Breath, No Abdominal pain, No Nausea or Vommitting,No Blood in stool or Urine, No dysuria, No new skin rashes or bruises, No new joints pains-aches,  No new weakness, tingling, numbness in any extremity, No recent weight gain or loss, No polyuria, polydypsia or polyphagia, No significant Mental Stressors.  A full 10 point Review of Systems was done, except as stated above, all other Review of Systems were negative.   With Past History of the following :    Past Medical History:  Diagnosis Date  . Dementia   . Diabetes mellitus without complication (HCC)   . Hypertension       Past Surgical History:  Procedure Laterality Date  . ABDOMINAL HYSTERECTOMY    . APPENDECTOMY    . CHOLECYSTECTOMY        Social History:     Social History  Substance Use Topics  . Smoking status: Never Smoker  . Smokeless tobacco: Never Used  . Alcohol use No     Lives - alf  Mobility -    Family History :     Family History  Problem Relation Age of Onset  . Family history  unknown: Yes    Home Medications:   Prior to Admission medications   Medication Sig Start Date End Date Taking? Authorizing Provider  acetaminophen (TYLENOL) 500 MG tablet Take 500 mg by mouth 3 (three) times daily.    Historical Provider, MD  ALPRAZolam Prudy Feeler) 0.25 MG tablet Take 0.25-0.5 mg by mouth 2 (two) times daily. Take one tablet in the morning and two tablets at bedtime    Historical Provider, MD  amLODipine-benazepril (LOTREL) 5-10 MG capsule Take 1 capsule by mouth daily.    Historical Provider, MD  diphenhydrAMINE (BENADRYL) 25 MG tablet Take 25 mg by mouth every 8 (eight) hours as needed for itching.    Historical Provider, MD  divalproex (DEPAKOTE) 250 MG DR tablet Take 250 mg by mouth 2 (two) times daily.    Historical Provider, MD  donepezil (ARICEPT) 10  MG tablet Take 10 mg by mouth at bedtime.    Historical Provider, MD  ferrous sulfate 325 (65 FE) MG tablet Take 325 mg by mouth daily with breakfast.    Historical Provider, MD  Folic Acid 0.8 MG CAPS Take 0.8 mg by mouth daily.    Historical Provider, MD  hydrocortisone cream 1 % Apply 1 application topically daily as needed for itching (where rash is noted).    Historical Provider, MD  levofloxacin (LEVAQUIN) 500 MG tablet Take 500 mg by mouth daily.    Historical Provider, MD  Melatonin 3 MG TABS Take 3 mg by mouth 3 (three) times daily.    Historical Provider, MD  mirtazapine (REMERON) 30 MG tablet Take 30 mg by mouth at bedtime.     Historical Provider, MD  Multiple Vitamin (MULTIVITAMIN WITH MINERALS) TABS tablet Take 1 tablet by mouth daily.    Historical Provider, MD  thiamine (VITAMIN B-1) 100 MG tablet Take 100 mg by mouth daily.    Historical Provider, MD  traMADol (ULTRAM) 50 MG tablet Take 50 mg by mouth 2 (two) times daily as needed for moderate pain or severe pain.    Historical Provider, MD  vitamin C (ASCORBIC ACID) 500 MG tablet Take 500 mg by mouth daily.    Historical Provider, MD     Allergies:    No  Known Allergies   Physical Exam:   Vitals  Blood pressure (!) 116/43, pulse 71, temperature 98.9 F (37.2 C), temperature source Oral, resp. rate 20, height 5\' 5"  (1.651 m), weight 59.8 kg (131 lb 12.8 oz), SpO2 100 %.   1. General  lying in bed in NAD,   2. Normal affect and insight, Not Suicidal or Homicidal, Awake Alert,   3. No F.N deficits, ALL C.Nerves Intact, Strength 5/5 all 4 extremities, Sensation intact all 4 extremities, Plantars down going.  4. Ears and Eyes appear Normal, Conjunctivae clear, PERRLA. Dry Oral Mucosa.  5. Supple Neck, No JVD, No cervical lymphadenopathy appriciated, No Carotid Bruits.  6. Symmetrical Chest wall movement, Good air movement bilaterally, CTAB.  7. RRR, No Gallops, Rubs or Murmurs, No Parasternal Heave.  8. Positive Bowel Sounds, Abdomen Soft, No tenderness, No organomegaly appriciated,No rebound -guarding or rigidity.  9.  No Cyanosis, Normal Skin Turgor, No Skin Rash or Bruise.  10. Good muscle tone,  joints appear normal , no effusions, Normal ROM.  11. No Palpable Lymph Nodes in Neck or Axillae     Data Review:    CBC No results for input(s): WBC, HGB, HCT, PLT, MCV, MCH, MCHC, RDW, LYMPHSABS, MONOABS, EOSABS, BASOSABS, BANDABS in the last 168 hours.  Invalid input(s): NEUTRABS, BANDSABD ------------------------------------------------------------------------------------------------------------------  Chemistries  No results for input(s): NA, K, CL, CO2, GLUCOSE, BUN, CREATININE, CALCIUM, MG, AST, ALT, ALKPHOS, BILITOT in the last 168 hours.  Invalid input(s): GFRCGP ------------------------------------------------------------------------------------------------------------------ CrCl cannot be calculated (Patient's most recent lab result is older than the maximum 21 days allowed.). ------------------------------------------------------------------------------------------------------------------ No results for input(s):  TSH, T4TOTAL, T3FREE, THYROIDAB in the last 72 hours.  Invalid input(s): FREET3  Coagulation profile No results for input(s): INR, PROTIME in the last 168 hours. ------------------------------------------------------------------------------------------------------------------- No results for input(s): DDIMER in the last 72 hours. -------------------------------------------------------------------------------------------------------------------  Cardiac Enzymes No results for input(s): CKMB, TROPONINI, MYOGLOBIN in the last 168 hours.  Invalid input(s): CK ------------------------------------------------------------------------------------------------------------------ No results found for: BNP   ---------------------------------------------------------------------------------------------------------------  Urinalysis    Component Value Date/Time   COLORURINE YELLOW 08/16/2016 2054   APPEARANCEUR CLEAR 08/16/2016  2054   APPEARANCEUR Clear 12/18/2013 2115   LABSPEC 1.025 08/16/2016 2054   LABSPEC 1.008 12/18/2013 2115   PHURINE 6.0 08/16/2016 2054   GLUCOSEU NEGATIVE 08/16/2016 2054   GLUCOSEU Negative 12/18/2013 2115   HGBUR NEGATIVE 08/16/2016 2054   BILIRUBINUR NEGATIVE 08/16/2016 2054   BILIRUBINUR Negative 12/18/2013 2115   KETONESUR NEGATIVE 08/16/2016 2054   PROTEINUR TRACE (A) 08/16/2016 2054   UROBILINOGEN 0.2 05/11/2013 1501   NITRITE NEGATIVE 08/16/2016 2054   LEUKOCYTESUR MODERATE (A) 08/16/2016 2054   LEUKOCYTESUR Negative 12/18/2013 2115    ----------------------------------------------------------------------------------------------------------------   Imaging Results:    No results found.    Assessment & Plan:    Active Problems:   Constipation   Dehydration   Diabetes (HCC)    1. Constipation Fleet enema pr x1 Abdominal flat and upright  2. Dehydration Ns iv  3. Dm2 fsbs ac and qhs, ,iss  4. Dementia Stable  5. Hypertension Cont  current medication.    DVT ProphylaxisLovenox - SCDs  AM Labs Ordered, also please review Full Orders  Family Communication: Admission, patients condition and plan of care including tests being ordered have been discussed with the patient  who indicate understanding and agree with the plan and Code Status.  Code Status  FULL   Likely DC to  ALF  Condition GUARDED    Consults called: none  Admission status:  observation  Time spent in minutes : 45 minutes   Pearson GrippeJames Alonte Wulff M.D on 09/09/2016 at 8:35 PM  Between 7am to 7am - Pager - [phone] .

## 2016-09-10 DIAGNOSIS — K5900 Constipation, unspecified: Secondary | ICD-10-CM | POA: Diagnosis not present

## 2016-09-10 LAB — GLUCOSE, CAPILLARY
GLUCOSE-CAPILLARY: 77 mg/dL (ref 65–99)
Glucose-Capillary: 91 mg/dL (ref 65–99)
Glucose-Capillary: 128 mg/dL — ABNORMAL HIGH (ref 65–99)
Glucose-Capillary: 76 mg/dL (ref 65–99)

## 2016-09-10 MED ORDER — POTASSIUM CHLORIDE CRYS ER 20 MEQ PO TBCR
20.0000 meq | EXTENDED_RELEASE_TABLET | Freq: Once | ORAL | Status: AC
  Administered 2016-09-10: 20 meq via ORAL
  Filled 2016-09-10: qty 1

## 2016-09-10 MED ORDER — LINACLOTIDE 145 MCG PO CAPS
290.0000 ug | ORAL_CAPSULE | Freq: Every day | ORAL | Status: DC
  Administered 2016-09-10 – 2016-09-12 (×3): 290 ug via ORAL
  Filled 2016-09-10 (×3): qty 2

## 2016-09-10 MED ORDER — POLYETHYLENE GLYCOL 3350 17 G PO PACK
17.0000 g | PACK | Freq: Two times a day (BID) | ORAL | Status: DC
  Administered 2016-09-10 – 2016-09-12 (×4): 17 g via ORAL
  Filled 2016-09-10 (×5): qty 1

## 2016-09-10 NOTE — Progress Notes (Signed)
Pt become agitated and uncooperative once family left. No IV access due to 3 failed attempts and PT refusing anymore sticks. MD contacted, put fluids on hold. PT refused to take any medications. Family should be here today to discuss the medications and plans for care. PT seems to be more cooperative and understanding of methods of care with family in the room. MD aware of refusal and agitation. PT has one on one bedside sitter. Continue to monitor.

## 2016-09-10 NOTE — Progress Notes (Signed)
Patient ID: Ashlee Barron, female   DOB: 06-20-1928, 82 y.o.   MRN: [id number]                                                               PROGRESS NOTE                                                                                                                                                                                                             Patient Demographics:    Ashlee Barron, is a 82 y.o. female, DOB - [birth year], WUJ:[id number]  Admit date - 09/09/2016   Admitting Physician Pearson Grippe, MD  Outpatient Primary MD for the patient is No PCP Per Patient  LOS - 0  Outpatient Specialists:  No chief complaint on file.    constipation  Brief Narrative    82 y.o. female, Dementia, Dm2, apparently has not had bm for the past 5-7 days. Even after miralax, colace and fleet enema x2.   Staff thought that she was dehydrated.  Pt will be admitted observation for refractory constipation, dehydration.    Subjective:    Ashlee Barron today has, not yet had a bm,  Pt didn't yet get her enema.  This am.     Assessment  & Plan :    Active Problems:   Constipation   Dehydration   Diabetes (HCC)   1.  Constipation Fleet enema per rectum Start on miralax 17gm po bid Start linzess po qday  2. Dm2 fsbs ac and qhs, iss  3. Dementia Cont haldol prn for agitation.  Cont aricept.  Consider increase in depakote   4. Hypokalemia kcl 20 meq po x1  Code Status :  FULL   Disposition Plan  :  Home/ ALF tomorrow  Barriers For Discharge :   Consults  :  none  Procedures  :   DVT Prophylaxis  :  Lovenox -  - SCDs   Lab Results  Component Value Date   PLT 211 09/09/2016    Antibiotics  :  none  Anti-infectives    None        Objective:   Vitals:   09/10/16 0500 09/10/16 0600 09/10/16 1400 09/10/16 2037  BP: (!) 100/50  (!) 99/51 116/60  Pulse: 81  90 61  Resp: 18  18 20  Temp: 98.4 F (36.9 C)  98.2 F (36.8 C) 99.3 F (37.4 C)  TempSrc:  Oral   Oral  SpO2: 100%  100% 100%  Weight: 59 kg (130 lb) 58.5 kg (129 lb 0.9 oz)    Height:        Wt Readings from Last 3 Encounters:  09/10/16 58.5 kg (129 lb 0.9 oz)  08/16/16 72.6 kg (160 lb)  05/11/13 72.1 kg (159 lb)     Intake/Output Summary (Last 24 hours) at 09/10/16 2058 Last data filed at 09/10/16 2023  Gross per 24 hour  Intake                0 ml  Output              400 ml  Net             -400 ml     Physical Exam  Awake Alert, Oriented X 1, No new F.N deficits, Normal affect Lacey.AT,PERRAL Supple Neck,No JVD, No cervical lymphadenopathy appriciated.  Symmetrical Chest wall movement, Good air movement bilaterally, CTAB RRR,No Gallops,Rubs or new Murmurs, No Parasternal Heave +ve B.Sounds, Abd Soft, No tenderness, No organomegaly appriciated, No rebound - guarding or rigidity. No Cyanosis, Clubbing or edema, No new Rash or bruise     Data Review:    CBC  Recent Labs Lab 09/09/16 2058  WBC 5.5  HGB 10.0*  HCT 31.8*  PLT 211  MCV 86.4  MCH 27.2  MCHC 31.4  RDW 14.3    Chemistries   Recent Labs Lab 09/09/16 2058  NA 142  K 3.4*  CL 106  CO2 26  GLUCOSE 110*  BUN 26*  CREATININE 0.80  CALCIUM 9.2  AST 32  ALT 15  ALKPHOS 62  BILITOT 0.4   ------------------------------------------------------------------------------------------------------------------ No results for input(s): CHOL, HDL, LDLCALC, TRIG, CHOLHDL, LDLDIRECT in the last 72 hours.  No results found for: HGBA1C ------------------------------------------------------------------------------------------------------------------  Recent Labs  09/09/16 2058  TSH 0.797   ------------------------------------------------------------------------------------------------------------------ No results for input(s): VITAMINB12, FOLATE, FERRITIN, TIBC, IRON, RETICCTPCT in the last 72 hours.  Coagulation profile No results for input(s): INR, PROTIME in the last 168 hours.  No  results for input(s): DDIMER in the last 72 hours.  Cardiac Enzymes No results for input(s): CKMB, TROPONINI, MYOGLOBIN in the last 168 hours.  Invalid input(s): CK ------------------------------------------------------------------------------------------------------------------ No results found for: BNP  Inpatient Medications  Scheduled Meds: . ALPRAZolam  0.5 mg Oral BID  . divalproex  250 mg Oral BID  . donepezil  10 mg Oral QHS  . enoxaparin (LOVENOX) injection  40 mg Subcutaneous Q24H  . ferrous sulfate  325 mg Oral Q breakfast  . folic acid  1 mg Oral Daily  . insulin aspart  0-5 Units Subcutaneous QHS  . insulin aspart  0-9 Units Subcutaneous TID WC  . linaclotide  290 mcg Oral QAC breakfast  . mirtazapine  30 mg Oral QHS  . multivitamin with minerals  1 tablet Oral Daily  . polyethylene glycol  17 g Oral BID  . potassium chloride  20 mEq Oral Once  . sodium phosphate  1 enema Rectal Once  . thiamine  100 mg Oral Daily  . vitamin C  500 mg Oral Daily   Continuous Infusions:  PRN Meds:.acetaminophen **OR** acetaminophen, bisacodyl, diphenhydrAMINE, haloperidol, haloperidol lactate  Micro Results Recent Results (from the past 240 hour(s))  MRSA PCR Screening     Status: None   Collection Time: 09/09/16  9:05 PM  Result Value Ref Range Status   MRSA by PCR NEGATIVE NEGATIVE Final    Comment:        The GeneXpert MRSA Assay (FDA approved for NASAL specimens only), is one component of a comprehensive MRSA colonization surveillance program. It is not intended to diagnose MRSA infection nor to guide or monitor treatment for MRSA infections.     Radiology Reports Dg Abd 2 Views  Result Date: 09/09/2016 CLINICAL DATA:  Acute onset of constipation.  Initial encounter. EXAM: ABDOMEN - 2 VIEW COMPARISON:  Chest radiograph performed 12/18/2013 FINDINGS: The lungs are hypoexpanded. Vascular congestion is noted. Mild right basilar atelectasis or scarring is seen. There  is no evidence of pleural effusion or pneumothorax. The cardiomediastinal silhouette is borderline normal in size. The visualized bowel gas pattern is unremarkable. Scattered stool and air are seen within the colon; there is no evidence of small bowel dilatation to suggest obstruction. No free intra-abdominal air is identified on the provided upright view. No acute osseous abnormalities are seen; the sacroiliac joints are unremarkable in appearance. Mild degenerative change is noted along the lumbar spine. IMPRESSION: 1. Unremarkable bowel gas pattern; no free intra-abdominal air seen. Large amount of stool noted in the colon, raising concern for constipation. 2. Lungs hypoexpanded. Vascular congestion noted. Mild right basilar atelectasis or scarring seen. Electronically Signed   By: Roanna Raider M.D.   On: 09/09/2016 23:07    Time Spent in minutes  30   Pearson Grippe M.D on 09/10/2016 at 8:58 PM  Between 7am to 7am - Pager - [phone]

## 2016-09-11 ENCOUNTER — Observation Stay (HOSPITAL_COMMUNITY): Payer: Medicare Other

## 2016-09-11 DIAGNOSIS — K5900 Constipation, unspecified: Secondary | ICD-10-CM | POA: Diagnosis not present

## 2016-09-11 LAB — COMPREHENSIVE METABOLIC PANEL
ALBUMIN: 3.6 g/dL (ref 3.5–5.0)
ALT: 15 U/L (ref 14–54)
AST: 36 U/L (ref 15–41)
Alkaline Phosphatase: 63 U/L (ref 38–126)
Anion gap: 8 (ref 5–15)
BUN: 15 mg/dL (ref 6–20)
CHLORIDE: 109 mmol/L (ref 101–111)
CO2: 27 mmol/L (ref 22–32)
CREATININE: 0.63 mg/dL (ref 0.44–1.00)
Calcium: 9.1 mg/dL (ref 8.9–10.3)
GFR calc Af Amer: >60 mL/min (ref >60)
GLUCOSE: 73 mg/dL (ref 65–99)
POTASSIUM: 3.7 mmol/L (ref 3.5–5.1)
SODIUM: 144 mmol/L (ref 135–145)
Total Bilirubin: 0.5 mg/dL (ref 0.3–1.2)
Total Protein: 7.1 g/dL (ref 6.5–8.1)

## 2016-09-11 LAB — GLUCOSE, CAPILLARY
Glucose-Capillary: 68 mg/dL (ref 65–99)
Glucose-Capillary: 103 mg/dL — ABNORMAL HIGH (ref 65–99)
Glucose-Capillary: 68 mg/dL (ref 65–99)
Glucose-Capillary: 89 mg/dL (ref 65–99)

## 2016-09-11 LAB — LIPID PANEL
CHOL/HDL RATIO: 3.9 ratio
CHOLESTEROL: 225 mg/dL — AB (ref 0–200)
HDL: 57 mg/dL (ref >40)
LDL CALC: 151 mg/dL — AB (ref 0–99)
Triglycerides: 87 mg/dL (ref <150)
VLDL: 17 mg/dL (ref 0–40)

## 2016-09-11 LAB — GAMMA GT: GGT: 63 U/L — ABNORMAL HIGH (ref 7–50)

## 2016-09-11 LAB — HEMOGLOBIN A1C
HEMOGLOBIN A1C: 5.4 % (ref 4.8–5.6)
Mean Plasma Glucose: 108 mg/dL

## 2016-09-11 LAB — VALPROIC ACID LEVEL: VALPROIC ACID LVL: 58 ug/mL (ref 50.0–100.0)

## 2016-09-11 MED ORDER — MINERAL OIL RE ENEM
1.0000 | ENEMA | Freq: Once | RECTAL | Status: AC
  Administered 2016-09-11: 1 via RECTAL

## 2016-09-11 MED ORDER — POLYETHYLENE GLYCOL 3350 17 G PO PACK: 17.0000 g | PACK | Freq: Two times a day (BID) | ORAL | 0 refills | Status: DC

## 2016-09-11 MED ORDER — ALPRAZOLAM 1 MG PO TABS: 1.0000 mg | ORAL_TABLET | Freq: Every day | ORAL | Status: DC

## 2016-09-11 MED ORDER — LINACLOTIDE 290 MCG PO CAPS: 290.0000 ug | ORAL_CAPSULE | Freq: Every day | ORAL | 0 refills | Status: DC

## 2016-09-11 MED ORDER — HALOPERIDOL 1 MG PO TABS: 1.0000 mg | ORAL_TABLET | Freq: Every day | ORAL | 0 refills | Status: DC | PRN

## 2016-09-11 MED ORDER — BISACODYL 10 MG RE SUPP: 10.0000 mg | Freq: Every day | RECTAL | 0 refills | Status: DC | PRN

## 2016-09-11 NOTE — Care Management Obs Status (Signed)
MEDICARE OBSERVATION STATUS NOTIFICATION   Patient Details  Name: Orlean BradfordMildred F Willig MRN: [id number] Date of Birth: May 28, 1928   Medicare Observation Status Notification Given:  Yes Staff witnessed signature. No family at bedside.    Fuller PlanWelborn, Manolito Jurewicz M, RN 09/11/2016, 2:17 PM

## 2016-09-11 NOTE — Progress Notes (Signed)
Notified Dr. Selena BattenKim that the enema was given, but the patient had to be  Disimpacted because she had a large amount hard stool  In the rectal vault.  The patient has now had multiple BMs.  Also notified him that the patients daughter voiced that she felt like the patient wasn't ready to go home tonight because she still complained of pain.  MD stated that the patient will be discharged tomorrow.  Daughter verbalized understanding.

## 2016-09-11 NOTE — Discharge Summary (Addendum)
Ashlee Barron, is a 82 y.o. female  DOB [birth year]  MRN [id number].  Admission date:  09/09/2016  Admitting Physician  Pearson Grippe, MD  Discharge Date:  09/11/2016   Primary MD  No PCP Per Patient Pearson Grippe M.D.  Recommendations for primary care physician for things to follow:   Constipation Start mirlax 17gm po bid Start linzess 290 micrograms po qday Dulcolax 10mg  po qday prn Try to encourage to drink water  Dehydration Encourage to drink   Dementia Started haldol 1mg  po qday prn agitation during this admission.  Cont aricept Stopped remeron  Insomnia Cont xanax at bedtime, may add seroquel at bedtime to help her sleep, but this can be done as outpatient.    Admission Diagnosis  dehydration constapation   Discharge Diagnosis  dehydration constapation    Active Problems:   Constipation   Dehydration   Diabetes (HCC)      Past Medical History:  Diagnosis Date  . Dementia   . Diabetes mellitus without complication (HCC)   . Hypertension     Past Surgical History:  Procedure Laterality Date  . ABDOMINAL HYSTERECTOMY    . APPENDECTOMY    . CHOLECYSTECTOMY         HPI  from the history and physical done on the day of admission:    82 y.o. female, Dementia, Dm2, apparently has not had bm for the past 5-7 days. Even after miralax, colace and fleet enema x2.   Staff thought that she was dehydrated.  Pt will be admitted observation for refractory constipation, dehydration.       Hospital Course:     Iv attempted for hydration but pt refused.  Xray showed large amount of stool in colon but no obstruction or ileus.  Fleet enema x1 given,  Pt had small bm.  Pt was started on mirlax as well as linzess.  Seems to be doing better.  Pt also has insomnia and agitation at nite.  We have added haldol 1mg  po qday prn agitation during this admission. Her liver function was slightly  abnormal and ruq ultrasound is pending as well as follow up labs.    Follow UP  Follow-up Information    Pearson Grippe, MD Follow up in 3 day(s).   Specialty:  Internal Medicine Contact information: [address] [place] [address] [phone]            Consults obtained - none  Discharge Condition: stable  Diet and Activity recommendation: See Discharge Instructions below  Discharge Instructions         Discharge Medications       Medication List    STOP taking these medications   amLODipine-benazepril 5-10 MG capsule Commonly known as:  LOTREL     TAKE these medications   acetaminophen 500 MG tablet Commonly known as:  TYLENOL Take 500 mg by mouth 3 (three) times daily.   ALPRAZolam 0.25 MG tablet Commonly known as:  XANAX Take 0.25 mg by mouth every morning. Take one tablet  in the morning and two tablets at bedtime   ALPRAZolam 1 MG tablet Commonly known as:  XANAX Take 1 mg by mouth at bedtime.   bisacodyl 10 MG suppository Commonly known as:  DULCOLAX Place 1 suppository (10 mg total) rectally daily as needed for moderate constipation.   divalproex 250 MG DR tablet Commonly known as:  DEPAKOTE Take 250 mg by mouth 2 (two) times daily.   donepezil 10 MG tablet Commonly known as:  ARICEPT Take 10 mg by mouth at bedtime.   ferrous sulfate 325 (65 FE) MG tablet Take 325 mg by mouth daily with breakfast.   haloperidol 1 MG tablet Commonly known as:  HALDOL Take 1 tablet (1 mg total) by mouth daily as needed for agitation.   linaclotide 290 MCG Caps capsule Commonly known as:  LINZESS Take 1 capsule (290 mcg total) by mouth daily before breakfast.   polyethylene glycol packet Commonly known as:  MIRALAX / GLYCOLAX Take 17 g by mouth 2 (two) times daily.   traMADol 50 MG tablet Commonly known as:  ULTRAM Take 50 mg by mouth 2 (two) times daily as needed for moderate pain or severe pain.       Major procedures and Radiology Reports  - PLEASE review detailed and final reports for all details, in brief -      Dg Abd 2 Views  Result Date: 09/09/2016 CLINICAL DATA:  Acute onset of constipation.  Initial encounter. EXAM: ABDOMEN - 2 VIEW COMPARISON:  Chest radiograph performed 12/18/2013 FINDINGS: The lungs are hypoexpanded. Vascular congestion is noted. Mild right basilar atelectasis or scarring is seen. There is no evidence of pleural effusion or pneumothorax. The cardiomediastinal silhouette is borderline normal in size. The visualized bowel gas pattern is unremarkable. Scattered stool and air are seen within the colon; there is no evidence of small bowel dilatation to suggest obstruction. No free intra-abdominal air is identified on the provided upright view. No acute osseous abnormalities are seen; the sacroiliac joints are unremarkable in appearance. Mild degenerative change is noted along the lumbar spine. IMPRESSION: 1. Unremarkable bowel gas pattern; no free intra-abdominal air seen. Large amount of stool noted in the colon, raising concern for constipation. 2. Lungs hypoexpanded. Vascular congestion noted. Mild right basilar atelectasis or scarring seen. Electronically Signed   By: Roanna RaiderJeffery  Chang M.D.   On: 09/09/2016 23:07    Micro Results     Recent Results (from the past 240 hour(s))  MRSA PCR Screening     Status: None   Collection Time: 09/09/16  9:05 PM  Result Value Ref Range Status   MRSA by PCR NEGATIVE NEGATIVE Final    Comment:        The GeneXpert MRSA Assay (FDA approved for NASAL specimens only), is one component of a comprehensive MRSA colonization surveillance program. It is not intended to diagnose MRSA infection nor to guide or monitor treatment for MRSA infections.        Today   Subjective    Ashlee Barron today has had a bm this am.    no headache,no chest abdominal pain,no new weakness tingling or numbness, feels much better wants to go home today.    Objective   Blood  pressure 120/68, pulse 83, temperature 98.9 F (37.2 C), temperature source Oral, resp. rate 18, height 5\' 5"  (1.651 m), weight 63.1 kg (139 lb 3.2 oz), SpO2 100 %.   Intake/Output Summary (Last 24 hours) at 09/11/16 0901 Last data filed at 09/11/16 [phone]  Gross per 24 hour  Intake              360 ml  Output              900 ml  Net             -540 ml    Exam Awake Alert, Oriented x 3, No new F.N deficits, Normal affect [place].AT,PERRAL Supple Neck,No JVD, No cervical lymphadenopathy appriciated.  Symmetrical Chest wall movement, Good air movement bilaterally, CTAB RRR,No Gallops,Rubs or new Murmurs, No Parasternal Heave +ve B.Sounds, Abd Soft, Non tender, No organomegaly appriciated, No rebound -guarding or rigidity. No Cyanosis, Clubbing or edema, No new Rash or bruise   Data Review   CBC w Diff:  Lab Results  Component Value Date   WBC 5.5 09/09/2016   HGB 10.0 (L) 09/09/2016   HGB 11.4 (L) 12/18/2013   HCT 31.8 (L) 09/09/2016   HCT 35.6 12/18/2013   PLT 211 09/09/2016   PLT 207 12/18/2013   LYMPHOPCT 24 05/11/2013   MONOPCT 6 05/11/2013   EOSPCT 2 05/11/2013   BASOPCT 0 05/11/2013    CMP:  Lab Results  Component Value Date   NA 142 09/09/2016   NA 139 12/18/2013   K 3.4 (L) 09/09/2016   K 4.3 12/18/2013   CL 106 09/09/2016   CL 107 12/18/2013   CO2 26 09/09/2016   CO2 25 12/18/2013   BUN 26 (H) 09/09/2016   BUN 11 12/18/2013   CREATININE 0.80 09/09/2016   CREATININE 0.81 12/18/2013   PROT 6.7 09/09/2016   PROT 8.7 (H) 12/18/2013   ALBUMIN 3.5 09/09/2016   ALBUMIN 3.9 12/18/2013   BILITOT 0.4 09/09/2016   BILITOT 0.1 (L) 12/18/2013   ALKPHOS 62 09/09/2016   ALKPHOS 221 (H) 12/18/2013   AST 32 09/09/2016   AST 54 (H) 12/18/2013   ALT 15 09/09/2016   ALT 35 12/18/2013  .   Total Time in preparing paper work, data evaluation and todays exam - 35 minutes  Pearson Grippe M.D on 09/11/2016 at 9:01 AM  [facility]   Office  [phone]

## 2016-09-12 DIAGNOSIS — K5900 Constipation, unspecified: Secondary | ICD-10-CM | POA: Diagnosis not present

## 2016-09-12 LAB — GLUCOSE, CAPILLARY
GLUCOSE-CAPILLARY: 101 mg/dL — AB (ref 65–99)
GLUCOSE-CAPILLARY: 72 mg/dL (ref 65–99)
GLUCOSE-CAPILLARY: 109 mg/dL — AB (ref 65–99)

## 2016-09-12 LAB — HEMOGLOBIN A1C
Hgb A1c MFr Bld: 5.3 % (ref 4.8–5.6)
Mean Plasma Glucose: 105 mg/dL

## 2016-09-12 NOTE — Clinical Social Work Note (Signed)
CSW notified [facility]. Damian Leavellrudy advised that she would pick patient up today @ 1.  CSW notified patient's brother, Verdis FredericksonRese Farrish, of patient's discharge today and transport plans.    CSW signing off.     Mckaylie Vasey, Juleen ChinaHeather D, LCSW

## 2016-09-12 NOTE — NC FL2 (Signed)
[place] MEDICAID FL2 LEVEL OF CARE SCREENING TOOL     IDENTIFICATION  Patient Name: Ashlee Barron Birthdate: 05-30-1928 Sex: female Admission Date (Current Location): 09/09/2016  [facility] and [place] Number:  [employer] and Address:  [facility],  [address], [place] [address]      Provider Number: [id number]  Attending Physician Name and Address:  Pearson Grippe, MD  Relative Name and Phone Number:       Current Level of Care: Hospital Recommended Level of Care: Assisted Living Facility Prior Approval Number:    Date Approved/Denied:   PASRR Number: [id number] [facility] ([id number])  Discharge Plan: Other (Comment)    Current Diagnoses: Patient Active Problem List   Diagnosis Date Noted  . Constipation 09/09/2016  . Dehydration 09/09/2016  . Diabetes (HCC) 09/09/2016    Orientation RESPIRATION BLADDER Height & Weight     Self  Normal Indwelling catheter Weight: 131 lb 6.4 oz (59.6 kg) Height:  5\' 5"  (165.1 cm)  BEHAVIORAL SYMPTOMS/MOOD NEUROLOGICAL BOWEL NUTRITION STATUS      Incontinent Diet (Regular)  AMBULATORY STATUS COMMUNICATION OF NEEDS Skin   Limited Assist Verbally Normal                       Personal Care Assistance Level of Assistance  Bathing, Dressing, Feeding Bathing Assistance: Maximum assistance Feeding assistance: Maximum assistance Dressing Assistance: Maximum assistance     Functional Limitations Info  Sight, Hearing, Speech Sight Info: Adequate Hearing Info: Adequate Speech Info: Adequate    SPECIAL CARE FACTORS FREQUENCY                       Contractures Contractures Info: Present    Additional Factors Info  Isolation Precautions Code Status Info: Full [place]    Psychotropic Info: Xanax, Depakote, Remeron, Ultram   Isolation Precautions Info: ESBL     Current Medications (09/12/2016):  This is the current hospital active medication list Current Facility-Administered Medications   Medication Dose Route Frequency Provider Last Rate Last Dose  . acetaminophen (TYLENOL) tablet 650 mg  650 mg Oral Q6H PRN Pearson Grippe, MD   650 mg at 09/11/16 1258   Or  . acetaminophen (TYLENOL) suppository 650 mg  650 mg Rectal Q6H PRN Pearson Grippe, MD      . ALPRAZolam Prudy Feeler) tablet 0.5 mg  0.5 mg Oral BID Pearson Grippe, MD   0.5 mg at 09/11/16 1258  . ALPRAZolam Prudy Feeler) tablet 1 mg  1 mg Oral QHS Pearson Grippe, MD      . bisacodyl (DULCOLAX) suppository 10 mg  10 mg Rectal Daily PRN Pearson Grippe, MD   10 mg at 09/11/16 1259  . diphenhydrAMINE (BENADRYL) capsule 25 mg  25 mg Oral Q8H PRN Pearson Grippe, MD      . divalproex (DEPAKOTE) DR tablet 250 mg  250 mg Oral BID Pearson Grippe, MD   250 mg at 09/11/16 1257  . donepezil (ARICEPT) tablet 10 mg  10 mg Oral QHS Pearson Grippe, MD   10 mg at 09/10/16 2251  . enoxaparin (LOVENOX) injection 40 mg  40 mg Subcutaneous Q24H Pearson Grippe, MD   40 mg at 09/10/16 2250  . ferrous sulfate tablet 325 mg  325 mg Oral Q breakfast Pearson Grippe, MD   325 mg at 09/11/16 1301  . folic acid (FOLVITE) tablet 1 mg  1 mg Oral Daily Pearson Grippe, MD   1 mg at 09/11/16 1258  .  haloperidol (HALDOL) tablet 1 mg  1 mg Oral Daily PRN Pearson GrippeJames Kim, MD   1 mg at 09/11/16 1258  . haloperidol lactate (HALDOL) injection 1 mg  1 mg Intravenous BID PRN Pearson GrippeJames Kim, MD      . insulin aspart (novoLOG) injection 0-5 Units  0-5 Units Subcutaneous QHS Pearson GrippeJames Kim, MD      . insulin aspart (novoLOG) injection 0-9 Units  0-9 Units Subcutaneous TID WC Pearson GrippeJames Kim, MD   1 Units at 09/10/16 1832  . linaclotide (LINZESS) capsule 290 mcg  290 mcg Oral QAC breakfast Pearson GrippeJames Kim, MD   290 mcg at 09/11/16 1257  . mirtazapine (REMERON) tablet 30 mg  30 mg Oral QHS Pearson GrippeJames Kim, MD   30 mg at 09/10/16 2254  . multivitamin with minerals tablet 1 tablet  1 tablet Oral Daily Pearson GrippeJames Kim, MD   1 tablet at 09/11/16 1258  . polyethylene glycol (MIRALAX / GLYCOLAX) packet 17 g  17 g Oral BID Pearson GrippeJames Kim, MD   17 g at 09/11/16 1259  . sodium phosphate  (FLEET) 7-19 GM/118ML enema 1 enema  1 enema Rectal Once Pearson GrippeJames Kim, MD      . thiamine (VITAMIN B-1) tablet 100 mg  100 mg Oral Daily Pearson GrippeJames Kim, MD   100 mg at 09/11/16 1259  . vitamin C (ASCORBIC ACID) tablet 500 mg  500 mg Oral Daily Pearson GrippeJames Kim, MD   500 mg at 09/11/16 1258     Discharge Medications: TAKE these medications   acetaminophen 500 MG tablet Commonly known as:  TYLENOL Take 500 mg by mouth 3 (three) times daily.  ALPRAZolam 0.25 MG tablet Commonly known as:  XANAX Take 0.25 mg by mouth every morning. Take one tablet in the morning and two tablets at bedtime  ALPRAZolam 1 MG tablet Commonly known as:  XANAX Take 1 mg by mouth at bedtime.  bisacodyl 10 MG suppository Commonly known as:  DULCOLAX Place 1 suppository (10 mg total) rectally daily as needed for moderate constipation.  divalproex 250 MG DR tablet Commonly known as:  DEPAKOTE Take 250 mg by mouth 2 (two) times daily.  donepezil 10 MG tablet Commonly known as:  ARICEPT Take 10 mg by mouth at bedtime.  ferrous sulfate 325 (65 FE) MG tablet Take 325 mg by mouth daily with breakfast.  haloperidol 1 MG tablet Commonly known as:  HALDOL Take 1 tablet (1 mg total) by mouth daily as needed for agitation.  linaclotide 290 MCG Caps capsule Commonly known as:  LINZESS Take 1 capsule (290 mcg total) by mouth daily before breakfast.  polyethylene glycol packet Commonly known as:  MIRALAX / GLYCOLAX Take 17 g by mouth 2 (two) times daily.  traMADol 50 MG tablet Commonly known as:  ULTRAM Take 50 mg by mouth 2 (two) times daily as needed for moderate pain or severe pain.       Relevant Imaging Results:  Relevant Lab Results:   Additional Information    Oshae Simmering, Juleen ChinaHeather D, LCSW

## 2016-09-12 NOTE — Progress Notes (Signed)
Report called to [facility].  Pt belongings sent with pt along with prescriptions.  SW electronically sent paperwork.

## 2016-09-12 NOTE — Clinical Social Work Note (Signed)
Clinical Social Work Assessment  Patient Details  Name: Ashlee BradfordMildred F Hernandes MRN: [id number] Date of Birth: 1928/04/04  Date of referral:  09/12/16               Reason for consult:  Other (Comment Required) (From [facility])                Permission sought to share information with:    Permission granted to share information::     Name::        Agency::     Relationship::     Contact Information:  Spoke with brother who is listed on chart regarding discharge today.   Housing/Transportation Living arrangements for the past 2 months:  Assisted Living Facility Source of Information:  Facility, Other (Comment Required) (Brother, Verdis Fredericksonese Farrish.) Patient Interpreter Needed:  None Criminal Activity/Legal Involvement Pertinent to Current Situation/Hospitalization:  No - Comment as needed Significant Relationships:  Adult Children, Other Family Members Lives with:  Facility Resident Do you feel safe going back to the place where you live?  Yes Need for family participation in patient care:  Yes (Comment)  Care giving concerns:  Facility resident, none identified.   Social Worker assessment / plan:  Damian Leavellrudy advised that patient has been a resident for some time. She receives assistance from staff with ADLs and ambulates with limited assistance (supervision). She stated confirmed that patient has family support and can return to the facility at discharge. CSW advised that patient was discharging today. Damian Leavellrudy advised that facility would pick patient up at 1:00 p.m. CSW spoke with patient's brother, Verdis FredericksonRese Farrish, who confirmed Trudy's statements. He stated that patient could return at discharge. CSW advised of today's discharge today.  Employment status:  Retired Database administratornsurance information:  Managed Medicare PT Recommendations:  Not assessed at this time Information / Referral to community resources:     Patient/Family's Response to care:  Family is agreeable for patient to return to the  facility. Patient/Family's Understanding of and Emotional Response to Diagnosis, Current Treatment, and Prognosis:  Family has understanding of patient's diagnosis, treatment and prognosis.  Emotional Assessment Appearance:  Appears stated age Attitude/Demeanor/Rapport:  Unable to Assess Affect (typically observed):  Unable to Assess Orientation:  Oriented to Self Alcohol / Substance use:  Not Applicable Psych involvement (Current and /or in the community):  No (Comment)  Discharge Needs  Concerns to be addressed:  Other (Comment Required (Return to facility) Readmission within the last 30 days:  No Current discharge risk:  None Barriers to Discharge:  No Barriers Identified   Annice NeedySettle, Quinlan Mcfall D, LCSW 09/12/2016, 11:33 AM

## 2016-09-12 NOTE — Discharge Summary (Signed)
Ashlee Barron, is a 82 y.o. female  DOB 04-11-1928  MRN [id number].  Admission date:  09/09/2016  Admitting Physician  Pearson Grippe, MD  Discharge Date:  09/12/2016   Primary MD  No PCP Per Patient  Recommendations for primary care physician for things to follow:   Constipation Start mirlax 17gm po bid Start linzess 290 micrograms po qday Dulcolax 10mg  po qday prn Try to encourage to drink water  Dehydration Encourage to drink   Dementia Started haldol 1mg  po qday prn agitation during this admission.  Cont aricept Stopped remeron  Insomnia Cont xanax at bedtime, may add seroquel at bedtime to help her sleep, but this can be done as outpatient.    Abnormal liver function Ultrasound was negative   Admission Diagnosis  dehydration constapation   Discharge Diagnosis  dehydration constapation    Active Problems:   Constipation   Dehydration   Diabetes (HCC)      Past Medical History:  Diagnosis Date  . Dementia   . Diabetes mellitus without complication (HCC)   . Hypertension     Past Surgical History:  Procedure Laterality Date  . ABDOMINAL HYSTERECTOMY    . APPENDECTOMY    . CHOLECYSTECTOMY         HPI  from the history and physical done on the day of admission:     82 y.o.female,Dementia, Dm2, apparently has not had bm for the past 5-7 days. Even after miralax, colace and fleet enema x2. Staff thought that she was dehydrated. Pt will be admitted observation for refractory constipation, dehydration.      Hospital Course:    Iv attempted for hydration but pt refused.  Xray showed large amount of stool in colon but no obstruction or ileus.  Fleet enema x1 given,  Pt had small bm.  Pt was started on mirlax as well as linzess.  Seems to be doing better.  Pt also has insomnia and agitation at nite.  We have added haldol 1mg  po qday prn agitation during this  admission. Her liver function was slightly abnormal and ruq ultrasound is unremarkable.  Pt had slight complaints of discomfort but this has resolved.  Pt appears stable and will be discharged home/ALF  Follow UP  Follow-up Information    Pearson Grippe, MD Follow up in 3 day(s).   Specialty:  Internal Medicine Contact information: [address] [place] [address] [phone]            Consults obtained - none  Discharge Condition: stable  Diet and Activity recommendation: See Discharge Instructions below  Discharge Instructions         Discharge Medications       Medication List    STOP taking these medications   amLODipine-benazepril 5-10 MG capsule Commonly known as:  LOTREL     TAKE these medications   acetaminophen 500 MG tablet Commonly known as:  TYLENOL Take 500 mg by mouth 3 (three) times daily.   ALPRAZolam 0.25 MG tablet Commonly  known as:  XANAX Take 0.25 mg by mouth every morning. Take one tablet in the morning and two tablets at bedtime   ALPRAZolam 1 MG tablet Commonly known as:  XANAX Take 1 mg by mouth at bedtime.   bisacodyl 10 MG suppository Commonly known as:  DULCOLAX Place 1 suppository (10 mg total) rectally daily as needed for moderate constipation.   divalproex 250 MG DR tablet Commonly known as:  DEPAKOTE Take 250 mg by mouth 2 (two) times daily.   donepezil 10 MG tablet Commonly known as:  ARICEPT Take 10 mg by mouth at bedtime.   ferrous sulfate 325 (65 FE) MG tablet Take 325 mg by mouth daily with breakfast.   haloperidol 1 MG tablet Commonly known as:  HALDOL Take 1 tablet (1 mg total) by mouth daily as needed for agitation.   linaclotide 290 MCG Caps capsule Commonly known as:  LINZESS Take 1 capsule (290 mcg total) by mouth daily before breakfast.   polyethylene glycol packet Commonly known as:  MIRALAX / GLYCOLAX Take 17 g by mouth 2 (two) times daily.   traMADol 50 MG tablet Commonly known as:   ULTRAM Take 50 mg by mouth 2 (two) times daily as needed for moderate pain or severe pain.       Major procedures and Radiology Reports - PLEASE review detailed and final reports for all details, in brief -      Dg Abd 2 Views  Result Date: 09/09/2016 CLINICAL DATA:  Acute onset of constipation.  Initial encounter. EXAM: ABDOMEN - 2 VIEW COMPARISON:  Chest radiograph performed 12/18/2013 FINDINGS: The lungs are hypoexpanded. Vascular congestion is noted. Mild right basilar atelectasis or scarring is seen. There is no evidence of pleural effusion or pneumothorax. The cardiomediastinal silhouette is borderline normal in size. The visualized bowel gas pattern is unremarkable. Scattered stool and air are seen within the colon; there is no evidence of small bowel dilatation to suggest obstruction. No free intra-abdominal air is identified on the provided upright view. No acute osseous abnormalities are seen; the sacroiliac joints are unremarkable in appearance. Mild degenerative change is noted along the lumbar spine. IMPRESSION: 1. Unremarkable bowel gas pattern; no free intra-abdominal air seen. Large amount of stool noted in the colon, raising concern for constipation. 2. Lungs hypoexpanded. Vascular congestion noted. Mild right basilar atelectasis or scarring seen. Electronically Signed   By: Roanna RaiderJeffery  Chang M.D.   On: 09/09/2016 23:07   [place] Abdomen Limited Ruq  Result Date: 09/11/2016 CLINICAL DATA:  Abnormal LFTs, remote cholecystectomy, hypertension and diabetes. EXAM: US ABDOMEN LIMITED - RIGHT UPPER QUADRANT COMPARISON:  None available FINDINGS: Limited exam because of obscuring bowel gas and uncooperative patient. Gallbladder: Apparently surgically absent. Gallbladder fossa is obscured by bowel gas. Common bile duct: Diameter: 3.2 mm. Very limited visualization. No gross biliary obstruction. Liver: Limited visualization. No intrahepatic biliary dilatation. Portal vein is patent with normal  hepatopetal flow. No definite focal hepatic abnormality. No upper abdominal free fluid. IMPRESSION: Remote cholecystectomy. No biliary dilatation No upper abdominal free fluid Very limited exam because of bowel gas and uncooperative patient. Electronically Signed   By: Judie PetitM.  Shick M.D.   On: 09/11/2016 11:33    Micro Results     Recent Results (from the past 240 hour(s))  MRSA PCR Screening     Status: None   Collection Time: 09/09/16  9:05 PM  Result Value Ref Range Status   MRSA by PCR NEGATIVE NEGATIVE Final    Comment:  The GeneXpert MRSA Assay (FDA approved for NASAL specimens only), is one component of a comprehensive MRSA colonization surveillance program. It is not intended to diagnose MRSA infection nor to guide or monitor treatment for MRSA infections.        Today   Subjective    Ashlee Barron today has no headache,no chest abdominal pain,no new weakness tingling or numbness, feels much better wants to go home today.    Objective   Blood pressure (!) 95/54, pulse 65, temperature 98.1 F (36.7 C), temperature source Oral, resp. rate 18, height 5\' 5"  (1.651 m), weight 59.6 kg (131 lb 6.4 oz), SpO2 100 %.   Intake/Output Summary (Last 24 hours) at 09/12/16 0831 Last data filed at 09/12/16 0500  Gross per 24 hour  Intake              460 ml  Output              500 ml  Net              -40 ml    Exam Awake Alert, Oriented x 1, No new F.N deficits, Normal affect [place].AT,PERRAL Supple Neck,No JVD, No cervical lymphadenopathy appriciated.  Symmetrical Chest wall movement, Good air movement bilaterally, CTAB RRR,No Gallops,Rubs or new Murmurs, No Parasternal Heave +ve B.Sounds, Abd Soft, Non tender, No organomegaly appriciated, No rebound -guarding or rigidity. No Cyanosis, Clubbing or edema, No new Rash or bruise   Data Review   CBC w Diff: Lab Results  Component Value Date   WBC 5.5 09/09/2016   HGB 10.0 (L) 09/09/2016   HGB 11.4 (L) 12/18/2013    HCT 31.8 (L) 09/09/2016   HCT 35.6 12/18/2013   PLT 211 09/09/2016   PLT 207 12/18/2013   LYMPHOPCT 24 05/11/2013   MONOPCT 6 05/11/2013   EOSPCT 2 05/11/2013   BASOPCT 0 05/11/2013    CMP: Lab Results  Component Value Date   NA 144 09/11/2016   NA 139 12/18/2013   K 3.7 09/11/2016   K 4.3 12/18/2013   CL 109 09/11/2016   CL 107 12/18/2013   CO2 27 09/11/2016   CO2 25 12/18/2013   BUN 15 09/11/2016   BUN 11 12/18/2013   CREATININE 0.63 09/11/2016   CREATININE 0.81 12/18/2013   PROT 7.1 09/11/2016   PROT 8.7 (H) 12/18/2013   ALBUMIN 3.6 09/11/2016   ALBUMIN 3.9 12/18/2013   BILITOT 0.5 09/11/2016   BILITOT 0.1 (L) 12/18/2013   ALKPHOS 63 09/11/2016   ALKPHOS 221 (H) 12/18/2013   AST 36 09/11/2016   AST 54 (H) 12/18/2013   ALT 15 09/11/2016   ALT 35 12/18/2013  .   Total Time in preparing paper work, data evaluation and todays exam - 35 minutes  Pearson Grippe M.D on 09/12/2016 at 8:31 AM

## 2016-09-20 ENCOUNTER — Emergency Department (HOSPITAL_COMMUNITY)
Admission: EM | Admit: 2016-09-20 | Discharge: 2016-09-20 | Disposition: A | Discharge status: Home / Self Care | Payer: Medicare Other | Attending: Emergency Medicine | Admitting: Emergency Medicine

## 2016-09-20 ENCOUNTER — Emergency Department (HOSPITAL_COMMUNITY): Payer: Medicare Other

## 2016-09-20 ENCOUNTER — Encounter (HOSPITAL_COMMUNITY): Payer: Self-pay | Admitting: Emergency Medicine

## 2016-09-20 DIAGNOSIS — Y999 Unspecified external cause status: Secondary | ICD-10-CM | POA: Diagnosis not present

## 2016-09-20 DIAGNOSIS — I10 Essential (primary) hypertension: Secondary | ICD-10-CM | POA: Insufficient documentation

## 2016-09-20 DIAGNOSIS — E119 Type 2 diabetes mellitus without complications: Secondary | ICD-10-CM | POA: Insufficient documentation

## 2016-09-20 DIAGNOSIS — Y939 Activity, unspecified: Secondary | ICD-10-CM | POA: Insufficient documentation

## 2016-09-20 DIAGNOSIS — S0990XA Unspecified injury of head, initial encounter: Secondary | ICD-10-CM | POA: Insufficient documentation

## 2016-09-20 DIAGNOSIS — Z79899 Other long term (current) drug therapy: Secondary | ICD-10-CM | POA: Insufficient documentation

## 2016-09-20 DIAGNOSIS — W19XXXA Unspecified fall, initial encounter: Secondary | ICD-10-CM

## 2016-09-20 DIAGNOSIS — Y92009 Unspecified place in unspecified non-institutional (private) residence as the place of occurrence of the external cause: Secondary | ICD-10-CM | POA: Diagnosis not present

## 2016-09-20 DIAGNOSIS — F0390 Unspecified dementia without behavioral disturbance: Secondary | ICD-10-CM | POA: Insufficient documentation

## 2016-09-20 DIAGNOSIS — W06XXXA Fall from bed, initial encounter: Secondary | ICD-10-CM | POA: Diagnosis not present

## 2016-09-20 DIAGNOSIS — S0083XA Contusion of other part of head, initial encounter: Secondary | ICD-10-CM

## 2016-09-20 DIAGNOSIS — S0993XA Unspecified injury of face, initial encounter: Secondary | ICD-10-CM | POA: Diagnosis present

## 2016-09-20 NOTE — ED Notes (Signed)
[facility] called for d/c transportation.

## 2016-09-20 NOTE — Discharge Instructions (Signed)
Your CT scans does not show serious injury of the head or neck.   Please return without fail for worsening symptoms, including confusion, intractable vomiting, severe pain or any other symptoms concerning to you.

## 2016-09-20 NOTE — ED Notes (Signed)
Pt states she rolled out of bed and into the floor. C/o lower back pain. Skin tear noted to left forehead and small laceration to left scalp. Nad noted.

## 2016-09-20 NOTE — ED Triage Notes (Addendum)
Pt from [facility], rolled out of bed that was in low position.  Small lac to lt side of head and lt eyebrow.  No active bleeding.  Cbg 126 per ems.

## 2016-09-20 NOTE — ED Provider Notes (Signed)
[facility]-EMERGENCY DEPT Provider Note   CSN: [id number] Arrival date & time: 09/20/16  0553     History   Chief Complaint Chief Complaint  Patient presents with  . Fall    HPI Ashlee Barron is a 82 y.o. female.  HPI  Level V caveat due to dementia. From [facility] facility. Patient reports falling out of bed this morning and hitting head on left side. No LOC. No blood thinners. Denies any pain or any injuries. Noted by facility to have small laceration to left temple.  Denies chest pain, N/V, headache, abdominal pain, back pain.  Past Medical History:  Diagnosis Date  . Dementia   . Diabetes mellitus without complication (HCC)   . Hypertension     Patient Active Problem List   Diagnosis Date Noted  . Constipation 09/09/2016  . Dehydration 09/09/2016  . Diabetes (HCC) 09/09/2016    Past Surgical History:  Procedure Laterality Date  . ABDOMINAL HYSTERECTOMY    . APPENDECTOMY    . CHOLECYSTECTOMY      OB History    No data available       Home Medications    Prior to Admission medications   Medication Sig Start Date End Date Taking? Authorizing Provider  acetaminophen (TYLENOL) 500 MG tablet Take 500 mg by mouth 3 (three) times daily.    Historical Provider, MD  ALPRAZolam Prudy Feeler) 0.25 MG tablet Take 0.25 mg by mouth every morning. Take one tablet in the morning and two tablets at bedtime     Historical Provider, MD  ALPRAZolam Prudy Feeler) 1 MG tablet Take 1 mg by mouth at bedtime.    Historical Provider, MD  bisacodyl (DULCOLAX) 10 MG suppository Place 1 suppository (10 mg total) rectally daily as needed for moderate constipation. 09/11/16   Pearson Grippe, MD  divalproex (DEPAKOTE) 250 MG DR tablet Take 250 mg by mouth 2 (two) times daily.    Historical Provider, MD  donepezil (ARICEPT) 10 MG tablet Take 10 mg by mouth at bedtime.    Historical Provider, MD  ferrous sulfate 325 (65 FE) MG tablet Take 325 mg by mouth daily with breakfast.    Historical  Provider, MD  haloperidol (HALDOL) 1 MG tablet Take 1 tablet (1 mg total) by mouth daily as needed for agitation. 09/11/16   Pearson Grippe, MD  linaclotide [facility]) 290 MCG CAPS capsule Take 1 capsule (290 mcg total) by mouth daily before breakfast. 09/11/16   Pearson Grippe, MD  polyethylene glycol [facility] Ethelene Hal) packet Take 17 g by mouth 2 (two) times daily. 09/11/16   Pearson Grippe, MD  traMADol (ULTRAM) 50 MG tablet Take 50 mg by mouth 2 (two) times daily as needed for moderate pain or severe pain.    Historical Provider, MD    Family History Family History  Problem Relation Age of Onset  . Family history unknown: Yes    Social History Social History  Substance Use Topics  . Smoking status: Never Smoker  . Smokeless tobacco: Never Used  . Alcohol use No     Allergies   Review of patient's allergies indicates no known allergies.   Review of Systems Review of Systems  Unable to perform ROS: Dementia     Physical Exam Updated Vital Signs BP (!) 117/52   Pulse 65   Temp 97.7 F (36.5 C) (Oral)   Resp 16   Wt 131 lb (59.4 kg)   SpO2 99%   BMI 21.80 kg/m   Physical Exam Physical  Exam  Nursing note and vitals reviewed. Constitutional: Well developed, well nourished, non-toxic, and in no acute distress Head: Normocephalic and small < 0.5 cm abrasion to the left temple.  Mouth/Throat: Oropharynx is clear and moist.  Neck: Normal range of motion. Neck supple. no cervical spine tenderness Cardiovascular: Normal rate and regular rhythm.   Pulmonary/Chest: Effort normal and breath sounds normal. No chest wall tenderness. Abdominal: Soft. There is no tenderness. There is no rebound and no guarding.  Musculoskeletal: Normal range of motion.  Neurological: Alert, no facial droop, fluent speech, moves all extremities symmetrically, sensation to light touch in tact throughout Skin: Skin is warm and dry.  Psychiatric: Cooperative   ED Treatments / Results  Labs (all labs  ordered are listed, but only abnormal results are displayed) Labs Reviewed - No data to display  EKG  EKG Interpretation None       Radiology Ct Head Wo Contrast  Result Date: 09/20/2016 CLINICAL DATA:  Pain following fall EXAM: CT HEAD WITHOUT CONTRAST CT CERVICAL SPINE WITHOUT CONTRAST TECHNIQUE: Multidetector CT imaging of the head and cervical spine was performed following the standard protocol without intravenous contrast. Multiplanar CT image reconstructions of the cervical spine were also generated. COMPARISON:  Cervical spine CT May 11, 2013; brain MRI May 13, 2013 FINDINGS: CT HEAD FINDINGS Brain: There is mild to moderate diffuse atrophy, stable. There is no intracranial mass, hemorrhage, extra-axial fluid collection, or midline shift. There is patchy small vessel disease in the centra semiovale bilaterally. Elsewhere gray-white compartments appear normal. No acute infarct evident. Vascular: There is no hyperdense vessel evident. There is calcification in each carotid siphon region. Mild calcification is also noted basilar artery. Skull: No demonstrable fracture. There is a small benign-appearing left frontal bone exostosis. Sinuses/Orbits: Visualized paranasal sinuses are clear. Visualized orbits appear symmetric bilaterally. Other: Mastoid air cells are clear. CT CERVICAL SPINE FINDINGS Alignment: There is 3 mm of retrolisthesis of C5 on C6, stable from prior study. There is no new spondylolisthesis. Skull base and vertebrae: The craniocervical junction region and skull base appear normal. Mild pannus posterior to the odontoid does not cause appreciable impression on the craniocervical junction. There is no evident fracture. No blastic or lytic bone lesions are evident. Soft tissues and spinal canal: Prevertebral soft tissues and predental space regions are normal. No paraspinous lesions are evident. There is no high-grade stenosis evident. Disc levels: There is moderate disc space  narrowing at C5-6 and C6-7. There is milder disc space narrowing at C7-T1. There is multilevel facet hypertrophy. Exit foraminal narrowing due to bony hypertrophy is most notable at C2-3 on the left and at C5-6 on the left, although milder exit foraminal narrowing is noted at multiple levels bilaterally elsewhere. No frank disc extrusion is evident. Upper chest: Visualized lung apices are clear. Other: There is carotid artery calcification bilaterally, more extensive on the right than on the left. IMPRESSION: CT head: Atrophy with patchy periventricular small vessel disease. No intracranial mass, hemorrhage, or extra-axial fluid collection. No acute infarct evident. Calcification noted in the carotid siphon regions bilaterally. Small benign left frontal exostosis. CT cervical spine: No fracture. Stable spondylolisthesis at C5-6, felt to be due to underlying spondylosis. No new spondylolisthesis. Multilevel osteoarthritic change noted. There is carotid artery calcification, more severe on the right than on the left. Electronically Signed   By: Bretta BangWilliam  Woodruff III M.D.   On: 09/20/2016 09:18   Ct Cervical Spine Wo Contrast  Result Date: 09/20/2016 CLINICAL DATA:  Pain following  fall EXAM: CT HEAD WITHOUT CONTRAST CT CERVICAL SPINE WITHOUT CONTRAST TECHNIQUE: Multidetector CT imaging of the head and cervical spine was performed following the standard protocol without intravenous contrast. Multiplanar CT image reconstructions of the cervical spine were also generated. COMPARISON:  Cervical spine CT May 11, 2013; brain MRI May 13, 2013 FINDINGS: CT HEAD FINDINGS Brain: There is mild to moderate diffuse atrophy, stable. There is no intracranial mass, hemorrhage, extra-axial fluid collection, or midline shift. There is patchy small vessel disease in the centra semiovale bilaterally. Elsewhere gray-white compartments appear normal. No acute infarct evident. Vascular: There is no hyperdense vessel evident. There is  calcification in each carotid siphon region. Mild calcification is also noted basilar artery. Skull: No demonstrable fracture. There is a small benign-appearing left frontal bone exostosis. Sinuses/Orbits: Visualized paranasal sinuses are clear. Visualized orbits appear symmetric bilaterally. Other: Mastoid air cells are clear. CT CERVICAL SPINE FINDINGS Alignment: There is 3 mm of retrolisthesis of C5 on C6, stable from prior study. There is no new spondylolisthesis. Skull base and vertebrae: The craniocervical junction region and skull base appear normal. Mild pannus posterior to the odontoid does not cause appreciable impression on the craniocervical junction. There is no evident fracture. No blastic or lytic bone lesions are evident. Soft tissues and spinal canal: Prevertebral soft tissues and predental space regions are normal. No paraspinous lesions are evident. There is no high-grade stenosis evident. Disc levels: There is moderate disc space narrowing at C5-6 and C6-7. There is milder disc space narrowing at C7-T1. There is multilevel facet hypertrophy. Exit foraminal narrowing due to bony hypertrophy is most notable at C2-3 on the left and at C5-6 on the left, although milder exit foraminal narrowing is noted at multiple levels bilaterally elsewhere. No frank disc extrusion is evident. Upper chest: Visualized lung apices are clear. Other: There is carotid artery calcification bilaterally, more extensive on the right than on the left. IMPRESSION: CT head: Atrophy with patchy periventricular small vessel disease. No intracranial mass, hemorrhage, or extra-axial fluid collection. No acute infarct evident. Calcification noted in the carotid siphon regions bilaterally. Small benign left frontal exostosis. CT cervical spine: No fracture. Stable spondylolisthesis at C5-6, felt to be due to underlying spondylosis. No new spondylolisthesis. Multilevel osteoarthritic change noted. There is carotid artery  calcification, more severe on the right than on the left. Electronically Signed   By: Bretta BangWilliam  Woodruff III M.D.   On: 09/20/2016 09:18    Procedures Procedures (including critical care time)  Medications Ordered in ED Medications - No data to display   Initial Impression / Assessment and Plan / ED Course  I have reviewed the triage vital signs and the nursing notes.  Pertinent labs & imaging results that were available during my care of the patient were reviewed by me and considered in my medical decision making (see chart for details).  Clinical Course    82 year old female with history of dementia who presents after mechanical fall out of bed. Vital signs within normal limits. She has a small abrasion over the left temple, but no other signs or symptoms of injury. CT head and cervical spine are visualized, and shows no acute intracranial or cervical spine traumatic injuries or other acute processes. Appears to be at baseline mental status, and appropriate for discharge back to her nursing facility.  Final Clinical Impressions(s) / ED Diagnoses   Final diagnoses:  Fall, initial encounter  Contusion of face, initial encounter  Injury of head, initial encounter    New  Prescriptions New Prescriptions   No medications on file     Lavera Guise, MD 09/20/16 [phone]

## 2016-10-09 ENCOUNTER — Encounter (HOSPITAL_COMMUNITY): Payer: Self-pay | Admitting: Emergency Medicine

## 2016-10-09 ENCOUNTER — Emergency Department (HOSPITAL_COMMUNITY)
Admission: EM | Admit: 2016-10-09 | Discharge: 2016-10-09 | Disposition: A | Discharge status: Home / Self Care | Payer: Medicare Other | Attending: Emergency Medicine | Admitting: Emergency Medicine

## 2016-10-09 ENCOUNTER — Emergency Department (HOSPITAL_COMMUNITY): Payer: Medicare Other

## 2016-10-09 DIAGNOSIS — Z79899 Other long term (current) drug therapy: Secondary | ICD-10-CM | POA: Diagnosis not present

## 2016-10-09 DIAGNOSIS — M25461 Effusion, right knee: Secondary | ICD-10-CM | POA: Insufficient documentation

## 2016-10-09 DIAGNOSIS — E119 Type 2 diabetes mellitus without complications: Secondary | ICD-10-CM | POA: Diagnosis not present

## 2016-10-09 DIAGNOSIS — I10 Essential (primary) hypertension: Secondary | ICD-10-CM | POA: Insufficient documentation

## 2016-10-09 DIAGNOSIS — M25561 Pain in right knee: Secondary | ICD-10-CM | POA: Diagnosis present

## 2016-10-09 DIAGNOSIS — F0390 Unspecified dementia without behavioral disturbance: Secondary | ICD-10-CM | POA: Diagnosis not present

## 2016-10-09 LAB — CBC WITH DIFFERENTIAL/PLATELET
BASOS ABS: 0 10*3/uL (ref 0.0–0.1)
Basophils Relative: 0 %
EOS PCT: 0 %
Eosinophils Absolute: 0 10*3/uL (ref 0.0–0.7)
HCT: 47.9 % — ABNORMAL HIGH (ref 36.0–46.0)
HEMOGLOBIN: 15.3 g/dL — AB (ref 12.0–15.0)
LYMPHS PCT: 12 %
Lymphs Abs: 1 10*3/uL (ref 0.7–4.0)
MCH: 28 pg (ref 26.0–34.0)
MCHC: 31.9 g/dL (ref 30.0–36.0)
MCV: 87.6 fL (ref 78.0–100.0)
Monocytes Absolute: 0.7 10*3/uL (ref 0.1–1.0)
Monocytes Relative: 9 %
NEUTROS ABS: 6.3 10*3/uL (ref 1.7–7.7)
NEUTROS PCT: 79 %
PLATELETS: 158 10*3/uL (ref 150–400)
RBC: 5.47 MIL/uL — AB (ref 3.87–5.11)
RDW: 13.8 % (ref 11.5–15.5)
WBC: 8 10*3/uL (ref 4.0–10.5)

## 2016-10-09 LAB — SEDIMENTATION RATE: Sed Rate: 64 mm/hr — ABNORMAL HIGH (ref 0–22)

## 2016-10-09 MED ORDER — PREDNISONE 20 MG PO TABS: 40.0000 mg | ORAL_TABLET | Freq: Every day | ORAL | 0 refills | Status: DC

## 2016-10-09 MED ORDER — PREDNISONE 50 MG PO TABS
60.0000 mg | ORAL_TABLET | ORAL | Status: AC
  Administered 2016-10-09: 60 mg via ORAL
  Filled 2016-10-09: qty 1

## 2016-10-09 NOTE — ED Triage Notes (Signed)
Pt c/o right knee pain since yesterday per Staff at [facility]. Denies fall/injury. Right knee swollen and hot to touch. Pt has dementeia and is not ambulatory but transferred to wheelchair with assistance at baseline. .Marland Kitchen

## 2016-10-09 NOTE — ED Notes (Signed)
Pt left er on prior shift,

## 2016-10-09 NOTE — Discharge Instructions (Signed)
As discussed, today's evaluation is demonstrated the inflammation going on in the right knee. This may be due to gout, or pseudogout, or changes from a minor recent fall.  It is important to take all medication as directed, and to follow-up with an orthopedic physician for appropriate ongoing care.  Return here for concerning changes in her condition.

## 2016-10-09 NOTE — ED Notes (Signed)
[facility] given to [facility] at [facility]. [place] [facility] [place] [facility] to provide [facility] transportation.

## 2016-10-09 NOTE — ED Provider Notes (Signed)
[facility] DEPT Provider Note   CSN: [id number] Arrival date & time: 10/09/16  1237   By signing my name below, I, Macon Large, attest that this documentation has been prepared under the direction and in the presence of Carmin Muskrat, MD. Electronically Signed: Macon Large, ED Scribe. 10/09/16. 1:57 PM.  History   Chief Complaint Chief Complaint  Patient presents with  . Knee Pain   The history is provided by the patient. No language interpreter was used.   LEVEL 5 CAVEAT: HPI and ROS limited due to dementia.   Per nurse note, Pt is a resident at [facility]. Staff member stated Pt was complaining of right knee pain onset yesterday. She denies fall or head injury. Pt is unable to ambulate on own. Pt has a Foley catheter in place.    Past Medical History:  Diagnosis Date  . Dementia   . Diabetes mellitus without complication ([place])   . Hypertension     Patient Active Problem List   Diagnosis Date Noted  . Constipation 09/09/2016  . Dehydration 09/09/2016  . Diabetes ([place]) 09/09/2016    Past Surgical History:  Procedure Laterality Date  . ABDOMINAL HYSTERECTOMY    . APPENDECTOMY    . CHOLECYSTECTOMY      OB History    No data available       Home Medications    Prior to Admission medications   Medication Sig Start Date End Date Taking? Authorizing Provider  acetaminophen (TYLENOL) 500 MG tablet Take 500 mg by mouth 3 (three) times daily.    Historical Provider, MD  ALPRAZolam Duanne Moron) 0.25 MG tablet Take 0.25 mg by mouth every morning. Take one tablet in the morning and two tablets at bedtime     Historical Provider, MD  ALPRAZolam Duanne Moron) 1 MG tablet Take 1 mg by mouth at bedtime.    Historical Provider, MD  bisacodyl (DULCOLAX) 10 MG suppository Place 1 suppository (10 mg total) rectally daily as needed for moderate constipation. 09/11/16   Jani Gravel, MD  divalproex (DEPAKOTE) 250 MG DR tablet Take 250 mg by mouth 2 (two) times daily.     Historical Provider, MD  donepezil (ARICEPT) 10 MG tablet Take 10 mg by mouth at bedtime.    Historical Provider, MD  ferrous sulfate 325 (65 FE) MG tablet Take 325 mg by mouth daily with breakfast.    Historical Provider, MD  haloperidol (HALDOL) 1 MG tablet Take 1 tablet (1 mg total) by mouth daily as needed for agitation. 09/11/16   Jani Gravel, MD  linaclotide [facility]) 290 MCG CAPS capsule Take 1 capsule (290 mcg total) by mouth daily before breakfast. 09/11/16   Jani Gravel, MD  polyethylene glycol [facility] / Floria Raveling) packet Take 17 g by mouth 2 (two) times daily. 09/11/16   Jani Gravel, MD  traMADol (ULTRAM) 50 MG tablet Take 50 mg by mouth 2 (two) times daily as needed for moderate pain or severe pain.    Historical Provider, MD    Family History Family History  Problem Relation Age of Onset  . Family history unknown: Yes    Social History Social History  Substance Use Topics  . Smoking status: Never Smoker  . Smokeless tobacco: Never Used  . Alcohol use No     Allergies   Patient has no known allergies.   Review of Systems Review of Systems  Unable to perform ROS: Dementia     Physical Exam Updated Vital Signs BP 162/80   Pulse 94  Temp 98.9 F (37.2 C)   Resp 18   Ht 5' 7" (1.702 m)   Wt 140 lb (63.5 kg)   SpO2 96%   BMI 21.93 kg/m   Physical Exam  Constitutional: She has a sickly appearance. No distress.  Frail-appearing elderly female sitting upright, but essentially noninteractive  HENT:  Head: Normocephalic and atraumatic.  Eyes: Conjunctivae and EOM are normal.  Cardiovascular: Normal rate and regular rhythm.   Pulmonary/Chest: Effort normal and breath sounds normal. No stridor. No respiratory distress.  Abdominal: She exhibits no distension.  Musculoskeletal: She exhibits no edema.       Right hip: Normal.       Right knee: She exhibits decreased range of motion, swelling and effusion. She exhibits no ecchymosis, no deformity, no laceration, no  erythema, normal alignment and no LCL laxity.       Left knee: Normal.       Right ankle: Normal.       Legs: Neurological: She displays atrophy. She exhibits abnormal muscle tone.  Patient does not follow commands reliably, but does move all extremity spontaneously, makes occasional verbal responses, but is not oriented appropriately  Skin: Skin is warm and dry.  Psychiatric: Cognition and memory are impaired. She is noncommunicative.  Nursing note and vitals reviewed.    ED Treatments / Results   DIAGNOSTIC STUDIES: Oxygen Saturation is 96% on RA, normal by my interpretation.    COORDINATION OF CARE: 1:31 PM Discussed treatment plan with pt at bedside which includes labs, right knee XR and pt agreed to plan.   Labs (all labs ordered are listed, but only abnormal results are displayed) Labs Reviewed  CBC WITH DIFFERENTIAL/PLATELET - Abnormal; Notable for the following:       Result Value   RBC 5.47 (*)    Hemoglobin 15.3 (*)    HCT 47.9 (*)    All other components within normal limits  SEDIMENTATION RATE - Abnormal; Notable for the following:    Sed Rate 64 (*)    All other components within normal limits     Radiology Dg Knee Complete 4 Views Right  Result Date: 10/09/2016 CLINICAL DATA:  Right knee pain, swelling.  No known injury. EXAM: RIGHT KNEE - COMPLETE 4+ VIEW COMPARISON:  None. FINDINGS: Chondrocalcinosis with diffuse degenerative joint disease changes. Large joint effusion. Intraarticular loose bodies noted. No acute fracture, subluxation or dislocation. IMPRESSION: Chondrocalcinosis with moderate tricompartment degenerative changes, intraarticular loose bodies and large joint effusion. No acute bony abnormality. Electronically Signed   By: Kevin  Dover M.D.   On: 10/09/2016 14:31    Procedures Procedures (including critical care time)  Medications Ordered in ED Medications - No data to display   Initial Impression / Assessment and Plan / ED Course  I  have reviewed the triage vital signs and the nursing notes.  Pertinent labs & imaging results that were available during my care of the patient were reviewed by me and considered in my medical decision making (see chart for details).  Clinical Course     On repeat exam the patient's daughter is now present. She states the patient does have frequent falls, notes the patient is interacting in a typical manner, with advanced dementia. We discussed all findings at length, including x-ray suggesting degenerative changes, effusion. With no leukocytosis, no fever, no erythema, septic arthritis is less likely, discussed this at length, discussed pros and cons of attempted joint aspiration. Patient has a history of recent hospitalization, with no compliance   with attempts to obtain IV therapy. Given some concern for inflammatory condition, likely gout, pseudogout or arthritis, the patient will start a short course of anti-inflammatories, will follow up with orthopedics. Without physical exam findings consistent with septic arthritis, and with reassuring labs aside from mild elevation in ESR, patient prepared for discharge with outpatient follow-up with orthopedics.  Final Clinical Impressions(s) / ED Diagnoses  Knee pain, right   I personally performed the services described in this documentation, which was scribed in my presence. The recorded information has been reviewed and is accurate.        , MD 10/09/16 1645  

## 2016-10-19 ENCOUNTER — Inpatient Hospital Stay (HOSPITAL_COMMUNITY)
Admission: AD | Admit: 2016-10-19 | Discharge: 2016-10-21 | DRG: 947 | Disposition: A | Discharge status: Home / Self Care | Payer: Medicare Other | Source: Ambulatory Visit | Attending: Internal Medicine | Admitting: Internal Medicine

## 2016-10-19 DIAGNOSIS — E1151 Type 2 diabetes mellitus with diabetic peripheral angiopathy without gangrene: Secondary | ICD-10-CM | POA: Diagnosis present

## 2016-10-19 DIAGNOSIS — R451 Restlessness and agitation: Secondary | ICD-10-CM | POA: Diagnosis present

## 2016-10-19 DIAGNOSIS — K5900 Constipation, unspecified: Secondary | ICD-10-CM | POA: Diagnosis present

## 2016-10-19 DIAGNOSIS — E876 Hypokalemia: Secondary | ICD-10-CM | POA: Diagnosis present

## 2016-10-19 DIAGNOSIS — Z90710 Acquired absence of both cervix and uterus: Secondary | ICD-10-CM

## 2016-10-19 DIAGNOSIS — I10 Essential (primary) hypertension: Secondary | ICD-10-CM | POA: Diagnosis present

## 2016-10-19 DIAGNOSIS — R627 Adult failure to thrive: Secondary | ICD-10-CM | POA: Diagnosis present

## 2016-10-19 DIAGNOSIS — Z6820 Body mass index (BMI) 20.0-20.9, adult: Secondary | ICD-10-CM

## 2016-10-19 DIAGNOSIS — R410 Disorientation, unspecified: Secondary | ICD-10-CM | POA: Diagnosis present

## 2016-10-19 DIAGNOSIS — F0390 Unspecified dementia without behavioral disturbance: Secondary | ICD-10-CM | POA: Diagnosis present

## 2016-10-19 DIAGNOSIS — N390 Urinary tract infection, site not specified: Secondary | ICD-10-CM | POA: Diagnosis present

## 2016-10-19 DIAGNOSIS — Z79899 Other long term (current) drug therapy: Secondary | ICD-10-CM

## 2016-10-19 DIAGNOSIS — Z66 Do not resuscitate: Secondary | ICD-10-CM | POA: Diagnosis present

## 2016-10-19 DIAGNOSIS — E43 Unspecified severe protein-calorie malnutrition: Secondary | ICD-10-CM | POA: Diagnosis present

## 2016-10-19 DIAGNOSIS — Z87440 Personal history of urinary (tract) infections: Secondary | ICD-10-CM

## 2016-10-19 DIAGNOSIS — R4182 Altered mental status, unspecified: Secondary | ICD-10-CM | POA: Diagnosis present

## 2016-10-19 NOTE — H&P (Signed)
H&P   Patient Demographics:    Ashlee Barron, is a 82 y.o. female  MRN: [id number]   DOB - Mar 02, 1928  Admit Date - 10/19/2016  Outpatient Primary MD for the patient is No PCP Per Patient  Jani Gravel M.D.  Referring MD/NP/PA:  Outpatient Specialists:    Patient coming from: [facility]  No chief complaint on file.  AMS, altered mental status   HPI:    Ashlee Barron  is a 82 y.o. female, w dementia, hypertension, dm2, constipation, hx of uti, apparently presents with altered mental status.  Pt has apparently recently sufferred decline in ability to walk . And staff felt that she was dehydrated and may have uti.  Pt is a poor historian due to dementia and will be admitted for altered mental status and FTT     Review of systems:    In addition to the HPI above,  ROS, Unable to obtain due to dementia.    With Past History of the following :    Past Medical History:  Diagnosis Date  . Dementia   . Diabetes mellitus without complication ([place])   . Hypertension       Past Surgical History:  Procedure Laterality Date  . ABDOMINAL HYSTERECTOMY    . APPENDECTOMY    . CHOLECYSTECTOMY        Social History:     Social History  Substance Use Topics  . Smoking status: Never Smoker  . Smokeless tobacco: Never Used  . Alcohol use No     Lives - at [facility] - unable to walk by self,  Mostly wheel chair bound   Family History :     Family History  Problem Relation Age of Onset  . Family history unknown: Yes      Home Medications:   Prior to Admission medications   Medication Sig Start Date End Date Taking? Authorizing Provider  acetaminophen (TYLENOL) 500 MG tablet Take 500 mg by mouth 3 (three) times daily.    Historical Provider, MD  ALPRAZolam Duanne Moron) 0.25 MG tablet Take 0.25 mg by mouth every morning. Take one tablet in the  morning and two tablets at bedtime     Historical Provider, MD  ALPRAZolam Duanne Moron) 1 MG tablet Take 1 mg by mouth at bedtime.    Historical Provider, MD  bisacodyl (DULCOLAX) 10 MG suppository Place 1 suppository (10 mg total) rectally daily as needed for moderate constipation. 09/11/16   Jani Gravel, MD  divalproex (DEPAKOTE) 250 MG DR tablet Take 250 mg by mouth 2 (two) times daily.    Historical Provider, MD  donepezil (ARICEPT) 10 MG tablet Take 10 mg by mouth at bedtime.    Historical Provider, MD  ferrous sulfate 325 (65 FE) MG tablet Take 325 mg by mouth daily with breakfast.    Historical Provider, MD  haloperidol (HALDOL)  1 MG tablet Take 1 tablet (1 mg total) by mouth daily as needed for agitation. 09/11/16   Jani Gravel, MD  linaclotide [facility]) 290 MCG CAPS capsule Take 1 capsule (290 mcg total) by mouth daily before breakfast. 09/11/16   Jani Gravel, MD  polyethylene glycol [facility] / Floria Raveling) packet Take 17 g by mouth 2 (two) times daily. 09/11/16   Jani Gravel, MD  predniSONE (DELTASONE) 20 MG tablet Take 2 tablets (40 mg total) by mouth daily with breakfast. For the next four days 10/09/16   Carmin Muskrat, MD  traMADol (ULTRAM) 50 MG tablet Take 50 mg by mouth 2 (two) times daily as needed for moderate pain or severe pain.    Historical Provider, MD     Allergies:    No Known Allergies   Physical Exam:   Vitals  Height '5\' 5"'$  (1.651 m).   1. General  lying in bed in NAD,    2. Normal affect and insight, Not Suicidal or Homicidal, Awake Alert, Oriented X 1  3. No F.N deficits, ALL C.Nerves Intact, Strength 5/5 all 4 extremities, Sensation intact all 4 extremities, Plantars down going.  4. Ears and Eyes appear Normal, Conjunctivae clear, PERRLA. Moist Oral Mucosa.  5. Supple Neck, No JVD, No cervical lymphadenopathy appriciated, No Carotid Bruits.  6. Symmetrical Chest wall movement, Good air movement bilaterally, CTAB.  7. RRR, No Gallops, Rubs or Murmurs, No Parasternal  Heave.  8. Positive Bowel Sounds, Abdomen Soft, No tenderness, No organomegaly appriciated,No rebound -guarding or rigidity.  9.  No Cyanosis, Normal Skin Turgor, No Skin Rash or Bruise.  10. Good muscle tone,  joints appear normal , no effusions, Normal ROM.  11. No Palpable Lymph Nodes in Neck or Axillae    Data Review:    CBC No results for input(s): WBC, HGB, HCT, PLT, MCV, MCH, MCHC, RDW, LYMPHSABS, MONOABS, EOSABS, BASOSABS, BANDABS in the last 168 hours.  Invalid input(s): NEUTRABS, BANDSABD ------------------------------------------------------------------------------------------------------------------  Chemistries  No results for input(s): NA, K, CL, CO2, GLUCOSE, BUN, CREATININE, CALCIUM, MG, AST, ALT, ALKPHOS, BILITOT in the last 168 hours.  Invalid input(s): GFRCGP ------------------------------------------------------------------------------------------------------------------ CrCl cannot be calculated (Patient's most recent lab result is older than the maximum 21 days allowed.). ------------------------------------------------------------------------------------------------------------------ No results for input(s): TSH, T4TOTAL, T3FREE, THYROIDAB in the last 72 hours.  Invalid input(s): FREET3  Coagulation profile No results for input(s): INR, PROTIME in the last 168 hours. ------------------------------------------------------------------------------------------------------------------- No results for input(s): DDIMER in the last 72 hours. -------------------------------------------------------------------------------------------------------------------  Cardiac Enzymes No results for input(s): CKMB, TROPONINI, MYOGLOBIN in the last 168 hours.  Invalid input(s): CK ------------------------------------------------------------------------------------------------------------------ No results found for:  BNP   ---------------------------------------------------------------------------------------------------------------  Urinalysis    Component Value Date/Time   COLORURINE YELLOW 09/09/2016 2105   APPEARANCEUR CLEAR 09/09/2016 2105   APPEARANCEUR Clear 12/18/2013 2115   LABSPEC >1.030 (H) 09/09/2016 2105   LABSPEC 1.008 12/18/2013 2115   PHURINE 6.0 09/09/2016 2105   GLUCOSEU NEGATIVE 09/09/2016 2105   GLUCOSEU Negative 12/18/2013 2115   HGBUR MODERATE (A) 09/09/2016 2105   BILIRUBINUR MODERATE (A) 09/09/2016 2105   BILIRUBINUR Negative 12/18/2013 2115   KETONESUR 15 (A) 09/09/2016 2105   PROTEINUR 100 (A) 09/09/2016 2105   UROBILINOGEN 0.2 05/11/2013 1501   NITRITE NEGATIVE 09/09/2016 2105   LEUKOCYTESUR MODERATE (A) 09/09/2016 2105   LEUKOCYTESUR Negative 12/18/2013 2115    ----------------------------------------------------------------------------------------------------------------   Imaging Results:    No results found.   Assessment & Plan:    Active Problems:   Altered mental  status    1. AMS Check cbc, cmp, b12, esr, tsh, rpr, ua Check CXR, Check CT brain noncontrast Stop depakote Stop Haldol  2. Dementia (severe) ? Worsening and cause of AMS Cont Aricept  3. Dm2 diet controlled fsbs ac and qhs, hga1c  4. Constipation Cont linzess  5. Weakness CT brain PT to evaluate and tx,   DVT Prophylaxis  Lovenox - SCDs   AM Labs Ordered, also please review Full Orders  Family Communication: Admission, patients condition and plan of care including tests being ordered have been discussed with the patient who indicate understanding and agree with the plan and Code Status.  Code Status DNR  Likely DC to  ALF  Condition GUARDED    Consults called:   Admission status: observation  Time spent in minutes : 45 minutes   Jani Gravel M.D on 10/19/2016 at 10:03 PM  Between 7am to 7am - Pager - [phone]

## 2016-10-20 ENCOUNTER — Observation Stay (HOSPITAL_COMMUNITY): Payer: Medicare Other

## 2016-10-20 DIAGNOSIS — R4182 Altered mental status, unspecified: Secondary | ICD-10-CM | POA: Diagnosis present

## 2016-10-20 DIAGNOSIS — E1151 Type 2 diabetes mellitus with diabetic peripheral angiopathy without gangrene: Secondary | ICD-10-CM | POA: Diagnosis present

## 2016-10-20 DIAGNOSIS — R627 Adult failure to thrive: Secondary | ICD-10-CM | POA: Diagnosis present

## 2016-10-20 DIAGNOSIS — F0390 Unspecified dementia without behavioral disturbance: Secondary | ICD-10-CM | POA: Diagnosis present

## 2016-10-20 DIAGNOSIS — Z66 Do not resuscitate: Secondary | ICD-10-CM | POA: Diagnosis present

## 2016-10-20 DIAGNOSIS — R451 Restlessness and agitation: Secondary | ICD-10-CM | POA: Diagnosis present

## 2016-10-20 DIAGNOSIS — Z79899 Other long term (current) drug therapy: Secondary | ICD-10-CM | POA: Diagnosis not present

## 2016-10-20 DIAGNOSIS — I10 Essential (primary) hypertension: Secondary | ICD-10-CM | POA: Diagnosis present

## 2016-10-20 DIAGNOSIS — K5900 Constipation, unspecified: Secondary | ICD-10-CM | POA: Diagnosis present

## 2016-10-20 DIAGNOSIS — Z90710 Acquired absence of both cervix and uterus: Secondary | ICD-10-CM | POA: Diagnosis not present

## 2016-10-20 DIAGNOSIS — N390 Urinary tract infection, site not specified: Secondary | ICD-10-CM | POA: Diagnosis present

## 2016-10-20 DIAGNOSIS — E43 Unspecified severe protein-calorie malnutrition: Secondary | ICD-10-CM | POA: Diagnosis present

## 2016-10-20 DIAGNOSIS — Z6820 Body mass index (BMI) 20.0-20.9, adult: Secondary | ICD-10-CM | POA: Diagnosis not present

## 2016-10-20 DIAGNOSIS — R410 Disorientation, unspecified: Secondary | ICD-10-CM | POA: Diagnosis present

## 2016-10-20 DIAGNOSIS — E876 Hypokalemia: Secondary | ICD-10-CM | POA: Diagnosis present

## 2016-10-20 DIAGNOSIS — Z87440 Personal history of urinary (tract) infections: Secondary | ICD-10-CM | POA: Diagnosis not present

## 2016-10-20 LAB — CBC WITH DIFFERENTIAL/PLATELET
BASOS PCT: 0 %
Basophils Absolute: 0 10*3/uL (ref 0.0–0.1)
Eosinophils Absolute: 0.2 10*3/uL (ref 0.0–0.7)
Eosinophils Relative: 3 %
HEMATOCRIT: 34.1 % — AB (ref 36.0–46.0)
HEMOGLOBIN: 10.9 g/dL — AB (ref 12.0–15.0)
LYMPHS ABS: 2 10*3/uL (ref 0.7–4.0)
Lymphocytes Relative: 24 %
MCH: 27.5 pg (ref 26.0–34.0)
MCHC: 32 g/dL (ref 30.0–36.0)
MCV: 86.1 fL (ref 78.0–100.0)
MONOS PCT: 10 %
Monocytes Absolute: 0.9 10*3/uL (ref 0.1–1.0)
NEUTROS ABS: 5.3 10*3/uL (ref 1.7–7.7)
NEUTROS PCT: 63 %
Platelets: 211 10*3/uL (ref 150–400)
RBC: 3.96 MIL/uL (ref 3.87–5.11)
RDW: 13.6 % (ref 11.5–15.5)
WBC: 8.5 10*3/uL (ref 4.0–10.5)

## 2016-10-20 LAB — COMPREHENSIVE METABOLIC PANEL
ALT: 16 U/L (ref 14–54)
ANION GAP: 8 (ref 5–15)
AST: 21 U/L (ref 15–41)
Albumin: 2.8 g/dL — ABNORMAL LOW (ref 3.5–5.0)
Alkaline Phosphatase: 58 U/L (ref 38–126)
BUN: 17 mg/dL (ref 6–20)
CHLORIDE: 105 mmol/L (ref 101–111)
CO2: 27 mmol/L (ref 22–32)
Calcium: 8.7 mg/dL — ABNORMAL LOW (ref 8.9–10.3)
Creatinine, Ser: 0.55 mg/dL (ref 0.44–1.00)
Glucose, Bld: 221 mg/dL — ABNORMAL HIGH (ref 65–99)
Potassium: 3 mmol/L — ABNORMAL LOW (ref 3.5–5.1)
SODIUM: 140 mmol/L (ref 135–145)
Total Bilirubin: 0.1 mg/dL — ABNORMAL LOW (ref 0.3–1.2)
Total Protein: 5.9 g/dL — ABNORMAL LOW (ref 6.5–8.1)

## 2016-10-20 LAB — GLUCOSE, CAPILLARY
GLUCOSE-CAPILLARY: 161 mg/dL — AB (ref 65–99)
GLUCOSE-CAPILLARY: 136 mg/dL — AB (ref 65–99)
Glucose-Capillary: 95 mg/dL (ref 65–99)
Glucose-Capillary: 127 mg/dL — ABNORMAL HIGH (ref 65–99)

## 2016-10-20 LAB — URINE MICROSCOPIC-ADD ON

## 2016-10-20 LAB — TSH: TSH: 0.584 u[IU]/mL (ref 0.350–4.500)

## 2016-10-20 LAB — URINALYSIS, ROUTINE W REFLEX MICROSCOPIC
BILIRUBIN URINE: NEGATIVE
Glucose, UA: NEGATIVE mg/dL
Ketones, ur: NEGATIVE mg/dL
NITRITE: POSITIVE — AB
PH: 7 (ref 5.0–8.0)
Protein, ur: 30 mg/dL — AB
SPECIFIC GRAVITY, URINE: 1.015 (ref 1.005–1.030)

## 2016-10-20 LAB — VITAMIN B12: VITAMIN B 12: 1436 pg/mL — AB (ref 180–914)

## 2016-10-20 LAB — SEDIMENTATION RATE: Sed Rate: 39 mm/hr — ABNORMAL HIGH (ref 0–22)

## 2016-10-20 LAB — MRSA PCR SCREENING: MRSA BY PCR: NEGATIVE

## 2016-10-20 MED ORDER — BISACODYL 10 MG RE SUPP: 10.0000 mg | Freq: Every day | RECTAL | Status: DC | PRN

## 2016-10-20 MED ORDER — ACETAMINOPHEN 325 MG PO TABS
650.0000 mg | ORAL_TABLET | Freq: Four times a day (QID) | ORAL | Status: DC | PRN
  Administered 2016-10-20: 650 mg via ORAL
  Filled 2016-10-20: qty 2

## 2016-10-20 MED ORDER — ALPRAZOLAM 1 MG PO TABS
1.0000 mg | ORAL_TABLET | Freq: Every day | ORAL | Status: DC
  Administered 2016-10-20: 1 mg via ORAL
  Filled 2016-10-20: qty 1

## 2016-10-20 MED ORDER — LINACLOTIDE 145 MCG PO CAPS
290.0000 ug | ORAL_CAPSULE | Freq: Every day | ORAL | Status: DC
  Administered 2016-10-21: 290 ug via ORAL
  Filled 2016-10-20: qty 2

## 2016-10-20 MED ORDER — DEXTROSE 5 % IV SOLN
1.0000 g | INTRAVENOUS | Status: DC
  Administered 2016-10-20 – 2016-10-21 (×2): 1 g via INTRAVENOUS
  Filled 2016-10-20 (×5): qty 10

## 2016-10-20 MED ORDER — ACETAMINOPHEN 500 MG PO TABS
500.0000 mg | ORAL_TABLET | Freq: Three times a day (TID) | ORAL | Status: DC
  Administered 2016-10-20 – 2016-10-21 (×3): 500 mg via ORAL
  Filled 2016-10-20 (×3): qty 1

## 2016-10-20 MED ORDER — ACETAMINOPHEN 650 MG RE SUPP: 650.0000 mg | Freq: Four times a day (QID) | RECTAL | Status: DC | PRN

## 2016-10-20 MED ORDER — LORAZEPAM 2 MG/ML IJ SOLN
1.0000 mg | Freq: Four times a day (QID) | INTRAMUSCULAR | Status: DC | PRN
  Administered 2016-10-20 (×2): 1 mg via INTRAVENOUS
  Filled 2016-10-20 (×2): qty 1

## 2016-10-20 MED ORDER — ENOXAPARIN SODIUM 40 MG/0.4ML ~~LOC~~ SOLN
40.0000 mg | SUBCUTANEOUS | Status: DC
  Administered 2016-10-21: 40 mg via SUBCUTANEOUS
  Filled 2016-10-20: qty 0.4

## 2016-10-20 MED ORDER — HALOPERIDOL 2 MG PO TABS: 1.0000 mg | ORAL_TABLET | Freq: Every day | ORAL | Status: DC | PRN

## 2016-10-20 MED ORDER — SODIUM CHLORIDE 0.9 % IV SOLN: INTRAVENOUS | Status: AC

## 2016-10-20 MED ORDER — ALPRAZOLAM 0.25 MG PO TABS
0.2500 mg | ORAL_TABLET | Freq: Every morning | ORAL | Status: DC
  Administered 2016-10-21: 0.25 mg via ORAL
  Filled 2016-10-20: qty 1

## 2016-10-20 MED ORDER — ENOXAPARIN SODIUM 30 MG/0.3ML ~~LOC~~ SOLN
30.0000 mg | SUBCUTANEOUS | Status: DC
  Administered 2016-10-20: 30 mg via SUBCUTANEOUS
  Filled 2016-10-20: qty 0.3

## 2016-10-20 MED ORDER — INSULIN ASPART 100 UNIT/ML ~~LOC~~ SOLN
0.0000 [IU] | Freq: Three times a day (TID) | SUBCUTANEOUS | Status: DC
  Administered 2016-10-20: 3 [IU] via SUBCUTANEOUS

## 2016-10-20 MED ORDER — LORAZEPAM 1 MG PO TABS
1.0000 mg | ORAL_TABLET | Freq: Once | ORAL | Status: AC
  Administered 2016-10-20: 1 mg via ORAL
  Filled 2016-10-20: qty 1

## 2016-10-20 MED ORDER — POLYETHYLENE GLYCOL 3350 17 G PO PACK
17.0000 g | PACK | Freq: Two times a day (BID) | ORAL | Status: DC
  Administered 2016-10-20 – 2016-10-21 (×2): 17 g via ORAL
  Filled 2016-10-20 (×2): qty 1

## 2016-10-20 MED ORDER — DONEPEZIL HCL 5 MG PO TABS
10.0000 mg | ORAL_TABLET | Freq: Every day | ORAL | Status: DC
  Administered 2016-10-20: 10 mg via ORAL
  Filled 2016-10-20: qty 2

## 2016-10-20 MED ORDER — SODIUM CHLORIDE 0.9 % IV SOLN
INTRAVENOUS | Status: AC
  Administered 2016-10-20: 02:00:00 via INTRAVENOUS

## 2016-10-20 NOTE — Progress Notes (Signed)
Patient ID: Ashlee Barron, female   DOB: [birth year], 82 y.o.   MRN: [id number]                                                                PROGRESS NOTE                                                                                                                                                                                                             Patient Demographics:    Ashlee Barron, is a 82 y.o. female, DOB - [birth year], ONG:[id number]  Admit date - 10/19/2016   Admitting Physician Pearson Grippe, MD  Outpatient Primary MD for the patient is No PCP Per Patient Pearson Grippe  LOS - 0  Outpatient Specialists:  No chief complaint on file.    AMS  Brief Narrative  82 yo female with dementia, apparently seemed weaker, unable to walk and more lethargic and possibly dehydrated at ALF and therefore sent for admission for observation   Subjective:    [name] today appears to be resting comfortably,  I spoke with her daughter and updated her on her mothers condition. CT brain negative.  Awaiting ua presently.     Assessment  & Plan :    Active Problems:   Altered mental status   1. AMS Awaiting ua In and out cath if unable to obtain urine random Gentle hydration  2. Dementia Probably worsening Holding medication  3.  dm2 fsbs ac and qhs, iss.  hga1c pending    Code Status : DNR  Family Communication  : spoke with her daughter  Disposition Plan  : ALF w hospice   Barriers For Discharge :   Consults  :  none  Procedures  :  CT brain negative  DVT Prophylaxis  :  Lovenox - SCDs   Lab Results  Component Value Date   PLT 211 10/20/2016    Antibiotics  :    Anti-infectives    None        Objective:   Vitals:   10/19/16 2100 10/20/16 0100  BP:  (!) 136/49  Pulse:  85  Resp:  18  Temp:  99.4 F (37.4 C)  TempSrc:  Oral  SpO2:  100%  Weight:  56 kg (123 lb 8 oz)  Height: 5\' 5"  (1.651 m) 5\' 5"  (1.651 m)    Wt Readings from Last 3  Encounters:  10/20/16 56 kg (123 lb 8 oz)  10/09/16 63.5 kg (140 lb)  09/20/16 59.4 kg (131 lb)     Intake/Output Summary (Last 24 hours) at 10/20/16 0738 Last data filed at 10/20/16 0142  Gross per 24 hour  Intake              240 ml  Output               50 ml  Net              190 ml     Physical Exam  Awake Alert, Oriented X 1 responds to name , No new F.N deficits, Normal affect [place].AT,PERRAL Supple Neck,No JVD, No cervical lymphadenopathy appriciated.  Symmetrical Chest wall movement, Good air movement bilaterally, CTAB RRR,No Gallops,Rubs or new Murmurs, No Parasternal Heave +ve B.Sounds, Abd Soft, No tenderness, No organomegaly appriciated, No rebound - guarding or rigidity. No Cyanosis, Clubbing or edema, No new Rash or bruise      Data Review:    CBC  Recent Labs Lab 10/20/16 0526  WBC 8.5  HGB 10.9*  HCT 34.1*  PLT 211  MCV 86.1  MCH 27.5  MCHC 32.0  RDW 13.6  LYMPHSABS 2.0  MONOABS 0.9  EOSABS 0.2  BASOSABS 0.0    Chemistries   Recent Labs Lab 10/20/16 0526  NA 140  K 3.0*  CL 105  CO2 27  GLUCOSE 221*  BUN 17  CREATININE 0.55  CALCIUM 8.7*  AST 21  ALT 16  ALKPHOS 58  BILITOT 0.1*   ------------------------------------------------------------------------------------------------------------------ No results for input(s): CHOL, HDL, LDLCALC, TRIG, CHOLHDL, LDLDIRECT in the last 72 hours.  Lab Results  Component Value Date   HGBA1C 5.3 09/11/2016   ------------------------------------------------------------------------------------------------------------------ No results for input(s): TSH, T4TOTAL, T3FREE, THYROIDAB in the last 72 hours.  Invalid input(s): FREET3 ------------------------------------------------------------------------------------------------------------------ No results for input(s): VITAMINB12, FOLATE, FERRITIN, TIBC, IRON, RETICCTPCT in the last 72 hours.  Coagulation profile No results for input(s): INR,  PROTIME in the last 168 hours.  No results for input(s): DDIMER in the last 72 hours.  Cardiac Enzymes No results for input(s): CKMB, TROPONINI, MYOGLOBIN in the last 168 hours.  Invalid input(s): CK ------------------------------------------------------------------------------------------------------------------ No results found for: BNP  Inpatient Medications  Scheduled Meds: . enoxaparin (LOVENOX) injection  30 mg Subcutaneous Q24H  . insulin aspart  0-9 Units Subcutaneous TID WC   Continuous Infusions: . sodium chloride 50 mL/hr at 10/20/16 0142   PRN Meds:.acetaminophen **OR** acetaminophen  Micro Results Recent Results (from the past 240 hour(s))  MRSA PCR Screening     Status: None   Collection Time: 10/20/16  1:59 AM  Result Value Ref Range Status   MRSA by PCR NEGATIVE NEGATIVE Final    Comment:        The GeneXpert MRSA Assay (FDA approved for NASAL specimens only), is one component of a comprehensive MRSA colonization surveillance program. It is not intended to diagnose MRSA infection nor to guide or monitor treatment for MRSA infections.     Radiology Reports Dg Chest 1 View  Result Date: 10/20/2016 CLINICAL DATA:  Acute onset of altered mental status. Initial encounter. EXAM: CHEST 1 VIEW COMPARISON:  Chest radiograph performed 12/18/2013 FINDINGS: The lungs are hypoexpanded. Peribronchial thickening is noted. Mild bilateral atelectasis seen. No pleural effusion or pneumothorax is identified. The cardiomediastinal silhouette is within normal limits. No  acute osseous abnormalities are seen. IMPRESSION: Lungs hypoexpanded. Peribronchial thickening noted. Mild bilateral atelectasis seen. Electronically Signed   By: Roanna Raider M.D.   On: 10/20/2016 02:39   Ct Head Wo Contrast  Result Date: 10/20/2016 CLINICAL DATA:  Altered mental status EXAM: CT HEAD WITHOUT CONTRAST TECHNIQUE: Contiguous axial images were obtained from the base of the skull through the  vertex without intravenous contrast. COMPARISON:  09/20/2016 FINDINGS: Brain: No acute territorial infarction, intracranial hemorrhage or focal mass lesion is visualized. Mild atrophy. Moderate periventricular white matter hypodensity consistent with small vessel disease. Ventricles are similar in size and morphology. Vascular: No hyperdense vessels.  Carotid artery calcifications. Skull: Mastoid air cells clear. No fracture. Left frontal exostosis unchanged. Sinuses/Orbits: Paranasal sinuses grossly clear. No acute orbital abnormality. Other: None IMPRESSION: No CT evidence for acute intracranial abnormality. Moderate periventricular white matter hypodensity consistent with small vessel disease. Electronically Signed   By: Jasmine Pang M.D.   On: 10/20/2016 02:44   Ct Head Wo Contrast  Result Date: 09/20/2016 CLINICAL DATA:  Pain following fall EXAM: CT HEAD WITHOUT CONTRAST CT CERVICAL SPINE WITHOUT CONTRAST TECHNIQUE: Multidetector CT imaging of the head and cervical spine was performed following the standard protocol without intravenous contrast. Multiplanar CT image reconstructions of the cervical spine were also generated. COMPARISON:  Cervical spine CT May 11, 2013; brain MRI May 13, 2013 FINDINGS: CT HEAD FINDINGS Brain: There is mild to moderate diffuse atrophy, stable. There is no intracranial mass, hemorrhage, extra-axial fluid collection, or midline shift. There is patchy small vessel disease in the centra semiovale bilaterally. Elsewhere gray-white compartments appear normal. No acute infarct evident. Vascular: There is no hyperdense vessel evident. There is calcification in each carotid siphon region. Mild calcification is also noted basilar artery. Skull: No demonstrable fracture. There is a small benign-appearing left frontal bone exostosis. Sinuses/Orbits: Visualized paranasal sinuses are clear. Visualized orbits appear symmetric bilaterally. Other: Mastoid air cells are clear. CT CERVICAL  SPINE FINDINGS Alignment: There is 3 mm of retrolisthesis of C5 on C6, stable from prior study. There is no new spondylolisthesis. Skull base and vertebrae: The craniocervical junction region and skull base appear normal. Mild pannus posterior to the odontoid does not cause appreciable impression on the craniocervical junction. There is no evident fracture. No blastic or lytic bone lesions are evident. Soft tissues and spinal canal: Prevertebral soft tissues and predental space regions are normal. No paraspinous lesions are evident. There is no high-grade stenosis evident. Disc levels: There is moderate disc space narrowing at C5-6 and C6-7. There is milder disc space narrowing at C7-T1. There is multilevel facet hypertrophy. Exit foraminal narrowing due to bony hypertrophy is most notable at C2-3 on the left and at C5-6 on the left, although milder exit foraminal narrowing is noted at multiple levels bilaterally elsewhere. No frank disc extrusion is evident. Upper chest: Visualized lung apices are clear. Other: There is carotid artery calcification bilaterally, more extensive on the right than on the left. IMPRESSION: CT head: Atrophy with patchy periventricular small vessel disease. No intracranial mass, hemorrhage, or extra-axial fluid collection. No acute infarct evident. Calcification noted in the carotid siphon regions bilaterally. Small benign left frontal exostosis. CT cervical spine: No fracture. Stable spondylolisthesis at C5-6, felt to be due to underlying spondylosis. No new spondylolisthesis. Multilevel osteoarthritic change noted. There is carotid artery calcification, more severe on the right than on the left. Electronically Signed   By: Bretta Bang III M.D.   On: 09/20/2016 09:18  Ct Cervical Spine Wo Contrast  Result Date: 09/20/2016 CLINICAL DATA:  Pain following fall EXAM: CT HEAD WITHOUT CONTRAST CT CERVICAL SPINE WITHOUT CONTRAST TECHNIQUE: Multidetector CT imaging of the head and  cervical spine was performed following the standard protocol without intravenous contrast. Multiplanar CT image reconstructions of the cervical spine were also generated. COMPARISON:  Cervical spine CT May 11, 2013; brain MRI May 13, 2013 FINDINGS: CT HEAD FINDINGS Brain: There is mild to moderate diffuse atrophy, stable. There is no intracranial mass, hemorrhage, extra-axial fluid collection, or midline shift. There is patchy small vessel disease in the centra semiovale bilaterally. Elsewhere gray-white compartments appear normal. No acute infarct evident. Vascular: There is no hyperdense vessel evident. There is calcification in each carotid siphon region. Mild calcification is also noted basilar artery. Skull: No demonstrable fracture. There is a small benign-appearing left frontal bone exostosis. Sinuses/Orbits: Visualized paranasal sinuses are clear. Visualized orbits appear symmetric bilaterally. Other: Mastoid air cells are clear. CT CERVICAL SPINE FINDINGS Alignment: There is 3 mm of retrolisthesis of C5 on C6, stable from prior study. There is no new spondylolisthesis. Skull base and vertebrae: The craniocervical junction region and skull base appear normal. Mild pannus posterior to the odontoid does not cause appreciable impression on the craniocervical junction. There is no evident fracture. No blastic or lytic bone lesions are evident. Soft tissues and spinal canal: Prevertebral soft tissues and predental space regions are normal. No paraspinous lesions are evident. There is no high-grade stenosis evident. Disc levels: There is moderate disc space narrowing at C5-6 and C6-7. There is milder disc space narrowing at C7-T1. There is multilevel facet hypertrophy. Exit foraminal narrowing due to bony hypertrophy is most notable at C2-3 on the left and at C5-6 on the left, although milder exit foraminal narrowing is noted at multiple levels bilaterally elsewhere. No frank disc extrusion is evident. Upper  chest: Visualized lung apices are clear. Other: There is carotid artery calcification bilaterally, more extensive on the right than on the left. IMPRESSION: CT head: Atrophy with patchy periventricular small vessel disease. No intracranial mass, hemorrhage, or extra-axial fluid collection. No acute infarct evident. Calcification noted in the carotid siphon regions bilaterally. Small benign left frontal exostosis. CT cervical spine: No fracture. Stable spondylolisthesis at C5-6, felt to be due to underlying spondylosis. No new spondylolisthesis. Multilevel osteoarthritic change noted. There is carotid artery calcification, more severe on the right than on the left. Electronically Signed   By: Bretta BangWilliam  Woodruff III M.D.   On: 09/20/2016 09:18   Dg Knee Complete 4 Views Right  Result Date: 10/09/2016 CLINICAL DATA:  Right knee pain, swelling.  No known injury. EXAM: RIGHT KNEE - COMPLETE 4+ VIEW COMPARISON:  None. FINDINGS: Chondrocalcinosis with diffuse degenerative joint disease changes. Large joint effusion. Intraarticular loose bodies noted. No acute fracture, subluxation or dislocation. IMPRESSION: Chondrocalcinosis with moderate tricompartment degenerative changes, intraarticular loose bodies and large joint effusion. No acute bony abnormality. Electronically Signed   By: Charlett NoseKevin  Dover M.D.   On: 10/09/2016 14:31    Time Spent in minutes  30   Pearson GrippeJames Neri Samek M.D on 10/20/2016 at 7:38 AM  Between 7am to 7am - Pager - [phone]

## 2016-10-20 NOTE — ACP (Advance Care Planning) (Signed)
Patient was sleeping and on isolation.

## 2016-10-20 NOTE — Clinical Social Work Note (Signed)
Clinical Social Work Assessment  Patient Details  Name: Ashlee Barron MRN: [id number] Date of Birth: 1928-09-11  Date of referral:  10/20/16               Reason for consult:  Discharge Planning                Permission sought to share information with:  Oceanographeracility Orlean BradfordContact Representative Permission granted to share information::  Yes, Verbal Permission Granted  Name::        Agency::  [facility]  Relationship::  facility  Contact Information:     Housing/Transportation Living arrangements for the past 2 months:  [facility] Source of Information:  Adult Children, Facility Patient Interpreter Needed:  None Criminal Activity/Legal Involvement Pertinent to Current Situation/Hospitalization:  No - Comment as needed Significant Relationships:  Adult Children Lives with:  Facility Resident Do you feel safe going back to the place where you live?  Yes Need for family participation in patient care:  Yes (Comment)  Care giving concerns:  None reported. Pt is resident at ALF.    Social Worker assessment / plan:  CSW spoke with pt's daughter, [name] as pt was sleeping and is oriented to self only. [facility]. [name] is involved and supportive. Per Damian Leavellrudy at facility, pt was walking with a walker two weeks ago and now uses a wheelchair. They noted increased AMS and brought pt to ED for evaluation. Pt has dementia. CSW noted hospice mentioned in progress note. Discussed with [name] who would like to wait for more information before making decision. Damian Leavellrudy indicates pt is okay for return and agreeable to either home health or hospice if needed.   Employment status:  Retired Database administratornsurance information:  Managed Medicare PT Recommendations:  Not assessed at this time Information / Referral to community resources:  Other (Comment Required) ([facility]  Patient/Family's Response to care:  Pt's [facility] when  medically stable.   Patient/Family's Understanding of and Emotional Response to Diagnosis, Current Treatment, and Prognosis:  Pt's daughter spoke with MD this morning and shared she was surprised to hear hospice mentioned. She would like to see results of UA before making a decision on this and would want to discuss with MD further.   Emotional Assessment Appearance:  Appears stated age Attitude/Demeanor/Rapport:  Unable to Assess Affect (typically observed):  Unable to Assess Orientation:  Oriented to Self Alcohol / Substance use:  Not Applicable Psych involvement (Current and /or in the community):  No (Comment)  Discharge Needs  Concerns to be addressed:  Discharge Planning Concerns Readmission within the last 30 days:  No Current discharge risk:  Cognitively Impaired Barriers to Discharge:  Continued Medical Work up   [name], Emira Eubanks [place], LCSW 10/20/2016, 8:32 AM [phone]

## 2016-10-20 NOTE — Care Management Obs Status (Signed)
MEDICARE OBSERVATION STATUS NOTIFICATION   Patient Details  Name: Ashlee Barron MRN: [id number] Date of Birth: [birth year]   Medicare Observation Status Notification Given:  Yes (signed by Griselda MinerSon, Leon Farrish)    Shamara Soza, Chrystine OilerSharley Diane, RN 10/20/2016, 2:44 PM

## 2016-10-20 NOTE — Care Management Note (Signed)
Case Management Note  Patient Details  Name: Ashlee Barron MRN: [id number] Date of Birth: 24-Feb-1928   Expected Discharge Date:  10/20/16               Expected Discharge Plan:  Assisted Living / Rest Home  In-House Referral:  Clinical Social Work  Discharge planning Services  CM Consult  Post Acute Care Choice:  NA Choice offered to:  NA  DME Arranged:    DME Agency:     HH Arranged:    HH Agency:     Status of Service:  Completed, signed off  If discussed at [employer] Length of [employer]Stay Meetings, dates discussed:    Additional Comments: Patient adm from ALF [facility] with AMS, UTI. Plans to return there at discharge. CSW aware and making arrangement for return. No CM needs. Carle Dargan, Chrystine OilerSharley Diane, RN 10/20/2016, 2:42 PM

## 2016-10-21 LAB — GLUCOSE, CAPILLARY
GLUCOSE-CAPILLARY: 94 mg/dL (ref 65–99)
Glucose-Capillary: 172 mg/dL — ABNORMAL HIGH (ref 65–99)

## 2016-10-21 LAB — HEMOGLOBIN A1C
HEMOGLOBIN A1C: 6 % — AB (ref 4.8–5.6)
MEAN PLASMA GLUCOSE: 126 mg/dL

## 2016-10-21 LAB — RPR: RPR Ser Ql: NONREACTIVE

## 2016-10-21 MED ORDER — POTASSIUM CHLORIDE 20 MEQ/15ML (10%) PO SOLN
20.0000 meq | Freq: Once | ORAL | Status: AC
  Administered 2016-10-21: 20 meq via ORAL
  Filled 2016-10-21: qty 30

## 2016-10-21 MED ORDER — CEPHALEXIN 500 MG PO CAPS: 500.0000 mg | ORAL_CAPSULE | Freq: Two times a day (BID) | ORAL | 0 refills | Status: DC

## 2016-10-21 NOTE — NC FL2 (Signed)
[place] MEDICAID FL2 LEVEL OF CARE SCREENING TOOL     IDENTIFICATION  Patient Name: Ashlee Barron Birthdate: 07-10-1928 Sex: female Admission Date (Current Location): 10/19/2016  [facility] [place]   Facility and Address:  [facility],  [address], [place] [id number]      Provider Number: [id number]  Attending Physician Name and Address:  Pearson GrippeJames Kim, MD  Relative Name and Phone Number:       Current Level of Care: Hospital Recommended Level of Care: Assisted Living Facility Prior Approval Number:    Date Approved/Denied:   PASRR Number:    Discharge Plan: Other (Comment) (ALF)    Current Diagnoses: Patient Active Problem List   Diagnosis Date Noted  . Altered mental status 10/19/2016  . Constipation 09/09/2016  . Dehydration 09/09/2016  . Diabetes (HCC) 09/09/2016    Orientation RESPIRATION BLADDER Height & Weight     Self  Normal Indwelling catheter Weight: 123 lb 8 oz (56 kg) Height:  5\' 5"  (165.1 cm)  BEHAVIORAL SYMPTOMS/MOOD NEUROLOGICAL BOWEL NUTRITION STATUS  Other (Comment) (none)  (n/a) Incontinent Diet (Carb modified)  AMBULATORY STATUS COMMUNICATION OF NEEDS Skin   Total Care Verbally Skin abrasions                       Personal Care Assistance Level of Assistance  Bathing, Feeding, Dressing Bathing Assistance: Maximum assistance Feeding assistance: Maximum assistance Dressing Assistance: Maximum assistance     Functional Limitations Info  Sight, Hearing, Speech Sight Info: Adequate Hearing Info: Adequate Speech Info: Adequate    SPECIAL CARE FACTORS FREQUENCY                       Contractures      Additional Factors Info  Isolation Precautions Code Status Info: DNR Allergies Info: No known allergies     Isolation Precautions Info: E.coli ESBL 08/16/16     Current Medications (10/21/2016):  This is the current hospital active medication list Current Facility-Administered  Medications  Medication Dose Route Frequency Provider Last Rate Last Dose  . acetaminophen (TYLENOL) tablet 650 mg  650 mg Oral Q6H PRN Pearson GrippeJames Kim, MD   650 mg at 10/20/16 0100   Or  . acetaminophen (TYLENOL) suppository 650 mg  650 mg Rectal Q6H PRN Pearson GrippeJames Kim, MD      . acetaminophen (TYLENOL) tablet 500 mg  500 mg Oral TID Pearson GrippeJames Kim, MD   500 mg at 10/21/16 1027  . ALPRAZolam Prudy Feeler(XANAX) tablet 0.25 mg  0.25 mg Oral q morning - 10a Pearson GrippeJames Kim, MD   0.25 mg at 10/21/16 1027  . ALPRAZolam Prudy Feeler(XANAX) tablet 1 mg  1 mg Oral QHS Pearson GrippeJames Kim, MD   1 mg at 10/20/16 2015  . bisacodyl (DULCOLAX) suppository 10 mg  10 mg Rectal Daily PRN Pearson GrippeJames Kim, MD      . cefTRIAXone (ROCEPHIN) 1 g in dextrose 5 % 50 mL IVPB  1 g Intravenous Q24H Pearson GrippeJames Kim, MD   1 g at 10/21/16 1101  . donepezil (ARICEPT) tablet 10 mg  10 mg Oral QHS Pearson GrippeJames Kim, MD   10 mg at 10/20/16 2016  . enoxaparin (LOVENOX) injection 40 mg  40 mg Subcutaneous Q24H Pearson GrippeJames Kim, MD   40 mg at 10/21/16 1027  . haloperidol (HALDOL) tablet 1 mg  1 mg Oral Daily PRN Pearson GrippeJames Kim, MD      . insulin aspart (novoLOG) injection 0-9 Units  0-9  Units Subcutaneous TID WC Pearson GrippeJames Kim, MD   3 Units at 10/20/16 0845  . linaclotide (LINZESS) capsule 290 mcg  290 mcg Oral QAC breakfast Pearson GrippeJames Kim, MD   290 mcg at 10/21/16 [phone]  . LORazepam (ATIVAN) injection 1 mg  1 mg Intravenous Q6H PRN Pearson GrippeJames Kim, MD   1 mg at 10/20/16 2201  . polyethylene glycol (MIRALAX / GLYCOLAX) packet 17 g  17 g Oral BID Pearson GrippeJames Kim, MD   17 g at 10/21/16 1026  . potassium chloride 20 MEQ/15ML (10%) solution 20 mEq  20 mEq Oral Once Pearson GrippeJames Kim, MD         Discharge Medications:   Medication List    STOP taking these medications   predniSONE 20 MG tablet Commonly known as:  DELTASONE    TAKE these medications   acetaminophen 500 MG tablet Commonly known as:  TYLENOL Take 500 mg by mouth 3 (three) times daily.  ALPRAZolam 0.25 MG tablet Commonly known as:  XANAX Take 0.25 mg by mouth every  morning. Take one tablet in the morning and two tablets at bedtime  ALPRAZolam 1 MG tablet Commonly known as:  XANAX Take 1 mg by mouth at bedtime.  bisacodyl 10 MG suppository Commonly known as:  DULCOLAX Place 1 suppository (10 mg total) rectally daily as needed for moderate constipation.  cephALEXin 500 MG capsule Commonly known as:  KEFLEX Take 1 capsule (500 mg total) by mouth 2 (two) times daily.  divalproex 250 MG DR tablet Commonly known as:  DEPAKOTE Take 250 mg by mouth 2 (two) times daily.  donepezil 10 MG tablet Commonly known as:  ARICEPT Take 10 mg by mouth at bedtime.  ferrous sulfate 325 (65 FE) MG tablet Take 325 mg by mouth daily with breakfast.  haloperidol 1 MG tablet Commonly known as:  HALDOL Take 1 tablet (1 mg total) by mouth daily as needed for agitation.  linaclotide 290 MCG Caps capsule Commonly known as:  LINZESS Take 1 capsule (290 mcg total) by mouth daily before breakfast.  polyethylene glycol packet Commonly known as:  MIRALAX / GLYCOLAX Take 17 g by mouth 2 (two) times daily.  traMADol 50 MG tablet Commonly known as:  ULTRAM Take 50 mg by mouth 2 (two) times daily as needed for moderate pain or severe pain.     Relevant Imaging Results:  Relevant Lab Results:   Additional Information    [name], Eligah Anello [place], [place] [phone]

## 2016-10-21 NOTE — Discharge Summary (Signed)
Ashlee BradfordMildred F Barron, is a 82 y.o. female  DOB 1928/03/12  MRN [id number].  Admission date:  10/19/2016  Admitting Physician  Pearson GrippeJames Hazel Leveille, MD  Discharge Date:  10/21/2016   Primary MD  No PCP Per Patient  Recommendations for primary care physician for things to follow:   AMS Slowly improving DC rocephin Switch to keflex 500mg  po bid x 6 days  UTI tx with keflex  Dementia Cont aricept Consider hospice  Hypokalemia Check bmp in 1 week.   Protein calorie malnutrition Start on Ensure 1 can po bid between meals   Admission Diagnosis  AMS   Discharge Diagnosis  AMS   Active Problems:   Altered mental status    UTI Dementia (end stage) Hypertension Dm2 (diet controlled)   Past Medical History:  Diagnosis Date  . Dementia   . Diabetes mellitus without complication (HCC)   . Hypertension     Past Surgical History:  Procedure Laterality Date  . ABDOMINAL HYSTERECTOMY    . APPENDECTOMY    . CHOLECYSTECTOMY         HPI  from the history and physical done on the day of admission:      82 y.o. female, w dementia, hypertension, dm2, constipation, hx of uti, apparently presents with altered mental status.  Pt has apparently recently sufferred decline in ability to walk . And staff felt that she was dehydrated and may have uti.  Pt is a poor historian due to dementia and will be admitted for altered mental status and FTT      Hospital Course:    pt found to have UTI,  AMS slightly improved,  Less agitation.  Pt was treated with rocephin and will be transitioned over to keflex.  She has been afrebrile, and stable.   Pt noted to have severe protein calorie malnutrition.  She was also mildy hypokalemic.  This was repleted.     Follow UP  Follow-up Information    Pearson GrippeJames Brooksie Ellwanger, MD Follow up in 3 day(s).   Specialty:  Internal Medicine Contact information: [address] [place]  [id number] [phone]            Consults obtained -  Discharge Condition: stable  Diet and Activity recommendation: See Discharge Instructions below  Discharge Instructions       Discharge Medications       Medication List    STOP taking these medications   predniSONE 20 MG tablet Commonly known as:  DELTASONE     TAKE these medications   acetaminophen 500 MG tablet Commonly known as:  TYLENOL Take 500 mg by mouth 3 (three) times daily.   ALPRAZolam 0.25 MG tablet Commonly known as:  XANAX Take 0.25 mg by mouth every morning. Take one tablet in the morning and two tablets at bedtime   ALPRAZolam 1 MG tablet Commonly known as:  XANAX Take 1 mg by mouth at bedtime.   bisacodyl 10 MG suppository Commonly known as:  DULCOLAX Place 1 suppository (10 mg  total) rectally daily as needed for moderate constipation.   cephALEXin 500 MG capsule Commonly known as:  KEFLEX Take 1 capsule (500 mg total) by mouth 2 (two) times daily.   divalproex 250 MG DR tablet Commonly known as:  DEPAKOTE Take 250 mg by mouth 2 (two) times daily.   donepezil 10 MG tablet Commonly known as:  ARICEPT Take 10 mg by mouth at bedtime.   ferrous sulfate 325 (65 FE) MG tablet Take 325 mg by mouth daily with breakfast.   haloperidol 1 MG tablet Commonly known as:  HALDOL Take 1 tablet (1 mg total) by mouth daily as needed for agitation.   linaclotide 290 MCG Caps capsule Commonly known as:  LINZESS Take 1 capsule (290 mcg total) by mouth daily before breakfast.   polyethylene glycol packet Commonly known as:  MIRALAX / GLYCOLAX Take 17 g by mouth 2 (two) times daily.   traMADol 50 MG tablet Commonly known as:  ULTRAM Take 50 mg by mouth 2 (two) times daily as needed for moderate pain or severe pain.       Major procedures and Radiology Reports - PLEASE review detailed and final reports for all details, in brief -      Dg Chest 1 View  Result Date: 10/20/2016 CLINICAL  DATA:  Acute onset of altered mental status. Initial encounter. EXAM: CHEST 1 VIEW COMPARISON:  Chest radiograph performed 12/18/2013 FINDINGS: The lungs are hypoexpanded. Peribronchial thickening is noted. Mild bilateral atelectasis seen. No pleural effusion or pneumothorax is identified. The cardiomediastinal silhouette is within normal limits. No acute osseous abnormalities are seen. IMPRESSION: Lungs hypoexpanded. Peribronchial thickening noted. Mild bilateral atelectasis seen. Electronically Signed   By: Roanna Raider M.D.   On: 10/20/2016 02:39   Ct Head Wo Contrast  Result Date: 10/20/2016 CLINICAL DATA:  Altered mental status EXAM: CT HEAD WITHOUT CONTRAST TECHNIQUE: Contiguous axial images were obtained from the base of the skull through the vertex without intravenous contrast. COMPARISON:  09/20/2016 FINDINGS: Brain: No acute territorial infarction, intracranial hemorrhage or focal mass lesion is visualized. Mild atrophy. Moderate periventricular white matter hypodensity consistent with small vessel disease. Ventricles are similar in size and morphology. Vascular: No hyperdense vessels.  Carotid artery calcifications. Skull: Mastoid air cells clear. No fracture. Left frontal exostosis unchanged. Sinuses/Orbits: Paranasal sinuses grossly clear. No acute orbital abnormality. Other: None IMPRESSION: No CT evidence for acute intracranial abnormality. Moderate periventricular white matter hypodensity consistent with small vessel disease. Electronically Signed   By: Jasmine Pang M.D.   On: 10/20/2016 02:44   Dg Knee Complete 4 Views Right  Result Date: 10/09/2016 CLINICAL DATA:  Right knee pain, swelling.  No known injury. EXAM: RIGHT KNEE - COMPLETE 4+ VIEW COMPARISON:  None. FINDINGS: Chondrocalcinosis with diffuse degenerative joint disease changes. Large joint effusion. Intraarticular loose bodies noted. No acute fracture, subluxation or dislocation. IMPRESSION: Chondrocalcinosis with moderate  tricompartment degenerative changes, intraarticular loose bodies and large joint effusion. No acute bony abnormality. Electronically Signed   By: Charlett Nose M.D.   On: 10/09/2016 14:31    Micro Results   Recent Results (from the past 240 hour(s))  MRSA PCR Screening     Status: None   Collection Time: 10/20/16  1:59 AM  Result Value Ref Range Status   MRSA by PCR NEGATIVE NEGATIVE Final    Comment:        The GeneXpert MRSA Assay (FDA approved for NASAL specimens only), is one component of a comprehensive MRSA colonization surveillance program.  It is not intended to diagnose MRSA infection nor to guide or monitor treatment for MRSA infections.        Today   Subjective    Philmore PaliMildred Barron today has no headache,no chest abdominal pain,no new weakness tingling or numbness, feels much better wants to go home today.   Objective   Blood pressure 125/88, pulse 100, temperature 97.1 F (36.2 C), temperature source Oral, resp. rate 15, height 5\' 5"  (1.651 m), weight 56 kg (123 lb 8 oz), SpO2 100 %.   Intake/Output Summary (Last 24 hours) at 10/21/16 1429 Last data filed at 10/21/16 1029  Gross per 24 hour  Intake             1130 ml  Output             3000 ml  Net            -1870 ml    Exam Awake Alert, Oriented x 1 No new F.N deficits, Normal affect [place].AT,PERRAL Supple Neck,No JVD, No cervical lymphadenopathy appriciated.  Symmetrical Chest wall movement, Good air movement bilaterally, CTAB RRR,No Gallops,Rubs or new Murmurs, No Parasternal Heave +ve B.Sounds, Abd Soft, Non tender, No organomegaly appriciated, No rebound -guarding or rigidity. No Cyanosis, Clubbing or edema, No new Rash or bruise Gait instability    Data Review   CBC w Diff:  Lab Results  Component Value Date   WBC 8.5 10/20/2016   HGB 10.9 (L) 10/20/2016   HGB 11.4 (L) 12/18/2013   HCT 34.1 (L) 10/20/2016   HCT 35.6 12/18/2013   PLT 211 10/20/2016   PLT 207 12/18/2013   LYMPHOPCT 24  10/20/2016   MONOPCT 10 10/20/2016   EOSPCT 3 10/20/2016   BASOPCT 0 10/20/2016    CMP:  Lab Results  Component Value Date   NA 140 10/20/2016   NA 139 12/18/2013   K 3.0 (L) 10/20/2016   K 4.3 12/18/2013   CL 105 10/20/2016   CL 107 12/18/2013   CO2 27 10/20/2016   CO2 25 12/18/2013   BUN 17 10/20/2016   BUN 11 12/18/2013   CREATININE 0.55 10/20/2016   CREATININE 0.81 12/18/2013   PROT 5.9 (L) 10/20/2016   PROT 8.7 (H) 12/18/2013   ALBUMIN 2.8 (L) 10/20/2016   ALBUMIN 3.9 12/18/2013   BILITOT 0.1 (L) 10/20/2016   BILITOT 0.1 (L) 12/18/2013   ALKPHOS 58 10/20/2016   ALKPHOS 221 (H) 12/18/2013   AST 21 10/20/2016   AST 54 (H) 12/18/2013   ALT 16 10/20/2016   ALT 35 12/18/2013  .   Total Time in preparing paper work, data evaluation and todays exam - 35 minutes  Pearson GrippeJames Thedore Pickel M.D on 10/21/2016 at 2:29 PM

## 2016-10-21 NOTE — Progress Notes (Signed)
[facility], report called and given to Selena BattenKim Doe med tech. Accompanied by staff to an awaiting vehicle.

## 2016-10-21 NOTE — Clinical Social Work Note (Addendum)
[facility]. Pt's daughter, [name] notified by voicemail. Facility aware and agreeable and will pick up pt this afternoon.  Update: [name] called CSW back and is agreeable to above.   Derenda FennelKara Keylee Shrestha, LCSW [phone]

## 2016-10-26 ENCOUNTER — Encounter (HOSPITAL_COMMUNITY): Payer: Self-pay

## 2016-10-26 ENCOUNTER — Emergency Department (HOSPITAL_COMMUNITY): Payer: Medicare Other

## 2016-10-26 ENCOUNTER — Emergency Department (HOSPITAL_COMMUNITY)
Admission: EM | Admit: 2016-10-26 | Discharge: 2016-10-26 | Disposition: A | Discharge status: Home / Self Care | Payer: Medicare Other | Attending: Emergency Medicine | Admitting: Emergency Medicine

## 2016-10-26 DIAGNOSIS — Z960 Presence of urogenital implants: Secondary | ICD-10-CM | POA: Insufficient documentation

## 2016-10-26 DIAGNOSIS — X58XXXA Exposure to other specified factors, initial encounter: Secondary | ICD-10-CM | POA: Insufficient documentation

## 2016-10-26 DIAGNOSIS — Y9289 Other specified places as the place of occurrence of the external cause: Secondary | ICD-10-CM | POA: Diagnosis not present

## 2016-10-26 DIAGNOSIS — I10 Essential (primary) hypertension: Secondary | ICD-10-CM | POA: Insufficient documentation

## 2016-10-26 DIAGNOSIS — Z79899 Other long term (current) drug therapy: Secondary | ICD-10-CM | POA: Diagnosis not present

## 2016-10-26 DIAGNOSIS — Y999 Unspecified external cause status: Secondary | ICD-10-CM | POA: Insufficient documentation

## 2016-10-26 DIAGNOSIS — S20211A Contusion of right front wall of thorax, initial encounter: Secondary | ICD-10-CM | POA: Diagnosis not present

## 2016-10-26 DIAGNOSIS — E119 Type 2 diabetes mellitus without complications: Secondary | ICD-10-CM | POA: Diagnosis not present

## 2016-10-26 DIAGNOSIS — Y9389 Activity, other specified: Secondary | ICD-10-CM | POA: Diagnosis not present

## 2016-10-26 DIAGNOSIS — J690 Pneumonitis due to inhalation of food and vomit: Secondary | ICD-10-CM | POA: Diagnosis not present

## 2016-10-26 DIAGNOSIS — S299XXA Unspecified injury of thorax, initial encounter: Secondary | ICD-10-CM | POA: Diagnosis present

## 2016-10-26 HISTORY — DX: Constipation, unspecified: K59.00

## 2016-10-26 LAB — COMPREHENSIVE METABOLIC PANEL
ALK PHOS: 99 U/L (ref 38–126)
ALT: 21 U/L (ref 14–54)
AST: 37 U/L (ref 15–41)
Albumin: 3.5 g/dL (ref 3.5–5.0)
Anion gap: 9 (ref 5–15)
BUN: 11 mg/dL (ref 6–20)
CALCIUM: 9.1 mg/dL (ref 8.9–10.3)
CHLORIDE: 102 mmol/L (ref 101–111)
CO2: 26 mmol/L (ref 22–32)
CREATININE: 0.69 mg/dL (ref 0.44–1.00)
GFR calc Af Amer: >60 mL/min (ref >60)
GFR calc non Af Amer: >60 mL/min (ref >60)
GLUCOSE: 151 mg/dL — AB (ref 65–99)
Potassium: 3.5 mmol/L (ref 3.5–5.1)
SODIUM: 137 mmol/L (ref 135–145)
Total Bilirubin: 0.6 mg/dL (ref 0.3–1.2)
Total Protein: 7.7 g/dL (ref 6.5–8.1)

## 2016-10-26 LAB — CBC WITH DIFFERENTIAL/PLATELET
BASOS ABS: 0 10*3/uL (ref 0.0–0.1)
Basophils Relative: 0 %
Eosinophils Absolute: 0 10*3/uL (ref 0.0–0.7)
Eosinophils Relative: 0 %
HEMATOCRIT: 38.2 % (ref 36.0–46.0)
HEMOGLOBIN: 12.3 g/dL (ref 12.0–15.0)
LYMPHS PCT: 7 %
Lymphs Abs: 1.1 10*3/uL (ref 0.7–4.0)
MCH: 28 pg (ref 26.0–34.0)
MCHC: 32.2 g/dL (ref 30.0–36.0)
MCV: 87 fL (ref 78.0–100.0)
MONO ABS: 0.6 10*3/uL (ref 0.1–1.0)
Monocytes Relative: 4 %
NEUTROS ABS: 13 10*3/uL — AB (ref 1.7–7.7)
Neutrophils Relative %: 89 %
Platelets: 205 10*3/uL (ref 150–400)
RBC: 4.39 MIL/uL (ref 3.87–5.11)
RDW: 14.4 % (ref 11.5–15.5)
WBC: 14.7 10*3/uL — ABNORMAL HIGH (ref 4.0–10.5)

## 2016-10-26 MED ORDER — SODIUM CHLORIDE 0.9 % IV BOLUS (SEPSIS)
1000.0000 mL | Freq: Once | INTRAVENOUS | Status: AC
  Administered 2016-10-26: 1000 mL via INTRAVENOUS

## 2016-10-26 MED ORDER — LEVOFLOXACIN IN D5W 750 MG/150ML IV SOLN
750.0000 mg | Freq: Once | INTRAVENOUS | Status: AC
  Administered 2016-10-26: 750 mg via INTRAVENOUS
  Filled 2016-10-26: qty 150

## 2016-10-26 MED ORDER — LEVOFLOXACIN 750 MG PO TABS: 750.0000 mg | ORAL_TABLET | Freq: Every day | ORAL | 0 refills | Status: DC

## 2016-10-26 NOTE — ED Triage Notes (Addendum)
Per ems, [facility] resident [facility] and had a syncopal episode while sitting at the table.  Reports staff lowered her to the floor and pt became alert.  CBG 164.  Pt presently on antibiotics for uti.

## 2016-10-26 NOTE — ED Notes (Signed)
Pt Head of bed elevated. O2 removed and sats remain 94% at this time

## 2016-10-26 NOTE — ED Notes (Signed)
O2 sats 88% on room air . O2 via 2L/min via [place]. EDP notified

## 2016-10-26 NOTE — ED Notes (Signed)
Ashlee RossettiKim Doe, Med Tech with [facility] says [facility] this [facility].

## 2016-10-26 NOTE — ED Notes (Signed)
Lab unable to obtain blood at this time

## 2016-10-26 NOTE — ED Provider Notes (Signed)
[facility]-EMERGENCY DEPT Provider Note   CSN: [id number] Arrival date & time: 10/26/16  1009  By signing my name below, I, [name], attest that this documentation has been prepared under the direction and in the presence of Marily MemosJason Deshawn Skelley, MD. Electronically Signed: [name], ED Scribe. 10/26/16. 10:16 AM.   History   Chief Complaint Chief Complaint  Patient presents with  . Loss of Consciousness   LEVEL FIVE CAVEAT: DEMENTIA  HPI HPI Comments: Ashlee BradfordMildred F Barron is a 82 y.o. female who presents to the Emergency Department by ambulance due to a syncopal episode that occurred this morning. Per EMS, [facility] and experienced an apparent syncopal episode while sitting at a table and when lowered to the floor she then became alert. Per EMS, pt is currently being treated for UTI, is taking Keflex and was d/c from the hospital yesterday. Her CBG was 164 en route. Per EMS, pt is typically combative and today is not exhibiting her nml reactions. Pt has a h/o dementia and does not speak or answer provider's questions.   The history is provided by medical records and the EMS personnel. The history is limited by the condition of the patient. No language interpreter was used.    Past Medical History:  Diagnosis Date  . Constipation   . Dementia   . Diabetes mellitus without complication (HCC)   . Hypertension     Patient Active Problem List   Diagnosis Date Noted  . Altered mental status 10/19/2016  . Constipation 09/09/2016  . Dehydration 09/09/2016  . Diabetes (HCC) 09/09/2016    Past Surgical History:  Procedure Laterality Date  . ABDOMINAL HYSTERECTOMY    . APPENDECTOMY    . CHOLECYSTECTOMY      OB History    No data available       Home Medications    Prior to Admission medications   Medication Sig Start Date End Date Taking? Authorizing Provider  ALPRAZolam (XANAX) 0.25 MG tablet Take 0.25 mg by mouth daily.    Yes Historical Provider, MD    ALPRAZolam Prudy Feeler(XANAX) 1 MG tablet Take 1 mg by mouth at bedtime.   Yes Historical Provider, MD  cephALEXin (KEFLEX) 500 MG capsule Take 1 capsule (500 mg total) by mouth 2 (two) times daily. 10/21/16  Yes Pearson GrippeJames Kim, MD  divalproex (DEPAKOTE) 250 MG DR tablet Take 250 mg by mouth 2 (two) times daily.   Yes Historical Provider, MD  donepezil (ARICEPT) 10 MG tablet Take 10 mg by mouth at bedtime.   Yes Historical Provider, MD  ferrous sulfate 325 (65 FE) MG tablet Take 325 mg by mouth daily with breakfast.   Yes Historical Provider, MD  linaclotide (LINZESS) 290 MCG CAPS capsule Take 1 capsule (290 mcg total) by mouth daily before breakfast. 09/11/16  Yes Pearson GrippeJames Kim, MD  polyethylene glycol (MIRALAX / GLYCOLAX) packet Take 17 g by mouth 2 (two) times daily. 09/11/16  Yes Pearson GrippeJames Kim, MD  acetaminophen (TYLENOL) 500 MG tablet Take 500 mg by mouth 3 (three) times daily as needed for mild pain.     Historical Provider, MD  bisacodyl (DULCOLAX) 10 MG suppository Place 1 suppository (10 mg total) rectally daily as needed for moderate constipation. 09/11/16   Pearson GrippeJames Kim, MD  haloperidol (HALDOL) 1 MG tablet Take 1 tablet (1 mg total) by mouth daily as needed for agitation. 09/11/16   Pearson GrippeJames Kim, MD  levofloxacin (LEVAQUIN) 750 MG tablet Take 1 tablet (750 mg total) by  mouth daily. X 6 days 10/27/16   Marily MemosJason Carman Auxier, MD  traMADol (ULTRAM) 50 MG tablet Take 50 mg by mouth every 12 (twelve) hours as needed for moderate pain or severe pain.     Historical Provider, MD    Family History Family History  Problem Relation Age of Onset  . Family history unknown: Yes    Social History Social History  Substance Use Topics  . Smoking status: Never Smoker  . Smokeless tobacco: Never Used  . Alcohol use No     Allergies   Patient has no known allergies.   Review of Systems Review of Systems  Unable to perform ROS: Dementia   Physical Exam Updated Vital Signs BP 114/64 (BP Location: Left Arm)   Pulse 74    Temp 98.5 F (36.9 C) (Oral)   Resp 22   SpO2 99%   Physical Exam  Constitutional: She appears well-developed.  HENT:  Head: Normocephalic.  Eyes: Conjunctivae and EOM are normal.  Neck: Normal range of motion.  Cardiovascular: Normal rate and regular rhythm.   Pulmonary/Chest: Effort normal. Tachypnea noted. She has decreased breath sounds. She has no wheezes. She has no rales.  Bruise noted to the right upper chest  Abdominal: Soft. She exhibits no distension.  Genitourinary:  Genitourinary Comments: Foley catheter in place and appears clear  Musculoskeletal: Normal range of motion.  No evidence of trauma to the BLEs. Moves all extremities w/o evidence of trauma.   Neurological: She is alert. No cranial nerve deficit.  Cranial nerves intact. Pt doesn't follow verbal commands.   Skin: Skin is warm and dry.  Nursing note and vitals reviewed.  ED Treatments / Results  Labs (all labs ordered are listed, but only abnormal results are displayed) Labs Reviewed  CBC WITH DIFFERENTIAL/PLATELET - Abnormal; Notable for the following:       Result Value   WBC 14.7 (*)    Neutro Abs 13.0 (*)    All other components within normal limits  COMPREHENSIVE METABOLIC PANEL - Abnormal; Notable for the following:    Glucose, Bld 151 (*)    All other components within normal limits    EKG  EKG Interpretation  Date/Time:  Wednesday October 26 2016 10:16:43 EST Ventricular Rate:  86 PR Interval:    QRS Duration: 71 QT Interval:  363 QTC Calculation: 435 R Axis:   57 Text Interpretation:  Sinus rhythm Nonspecific T abnormalities, lateral leads Confirmed by [facility] MD, Barbara CowerJASON [phone]([id number]) on 10/26/2016 11:04:41 AM       Radiology Dg Chest 2 View  Result Date: 10/26/2016 CLINICAL DATA:  82 year old female unresponsive at breakfast today. Hypoxia. Initial encounter. EXAM: CHEST  2 VIEW COMPARISON:  10/20/2016, 12/18/2013 FINDINGS: Seated upright AP and lateral views of the chest. Low lung  volumes. Chronic mild to moderate elevation of the right hemidiaphragm appears stable. Confluent pulmonary opacity at the medial left lung base, may involved the lingula and the lower lobe (arrows). No definite pleural effusion. No pneumothorax or pulmonary edema. Stable cardiac size and mediastinal contours. Visualized tracheal air column is within normal limits. No acute osseous abnormality identified. IMPRESSION: Abnormal left lung base opacity suspicious for aspiration and/or pneumonia in this clinical setting. No pleural effusion. Continued low lung volumes. Calcified aortic atherosclerosis. Electronically Signed   By: Odessa FlemingH  Hall M.D.   On: 10/26/2016 11:36    Procedures Procedures  COORDINATION OF CARE: 10:16 AM Pt has a h/o dementia and is not responsive to questions or commands. Will move  forward based on patient's medical records and additional details provided by EMS.   Medications Ordered in ED Medications  sodium chloride 0.9 % bolus 1,000 mL (0 mLs Intravenous Stopped 10/26/16 1221)  levofloxacin (LEVAQUIN) IVPB 750 mg (0 mg Intravenous Stopped 10/26/16 1523)     Initial Impression / Assessment and Plan / ED Course  I have reviewed the triage vital signs and the nursing notes.  Pertinent labs & imaging results that were available during my care of the patient were reviewed by me and considered in my medical decision making (see chart for details).  Clinical Course     Patient overall appears well. Likely an aspiration event yesterday. Unsure the patient syncopized as when her nephew shows up he says that she often falls asleep in the middle conversation and he can appear that she has loss of consciousness because she often doesn't respond at baseline very well. Chest x-ray with possible pneumonia and that she did have some tachypnea at some point I will treat her for that. No indication for admission this time as her sats are normal on room air she is in no distress and are vitals are  normal otherwise. We'll treat as an outpatient with Levaquin and also get speech-language pathology to help with the swallowing study and further management as indicated secondary to aspiration event.  I personally performed the services described in this documentation, which was scribed in my presence. The recorded information has been reviewed and is accurate.     Final Clinical Impressions(s) / ED Diagnoses   Final diagnoses:  Aspiration pneumonia of left lung, unspecified aspiration pneumonia type, unspecified part of lung [facility])    New Prescriptions Discharge Medication List as of 10/26/2016  3:07 PM    START taking these medications   Details  levofloxacin (LEVAQUIN) 750 MG tablet Take 1 tablet (750 mg total) by mouth daily. X 6 days, Starting Thu 10/27/2016, Print         Marily Memos, MD 10/27/16 [phone]

## 2016-10-26 NOTE — ED Notes (Signed)
Nephew reports patient aspirated on food yesterday.

## 2016-10-26 NOTE — ED Notes (Signed)
Pt stable and ready for pick-up and transport [facility].  Report called to Alycia RossettiKim Doe, Med Tech.

## 2016-10-26 NOTE — Care Management Note (Signed)
Case Management Note  Patient Details  Name: Ashlee Barron MRN: [id number] Date of Birth: 08-06-1928  [facility]. Pt with dementia and is WC bound at ALF. Pt aspirating at ALF. [facility]. Per pt's [facility]. [facility]. Alroy BailiffLinda Lothian, of [facility], is [facility] of [facility] and will obtain pt info from chart.  Pt returning to ALF today.   Expected Discharge Date:    10/26/2016              Expected Discharge Plan:  Assisted Living / Rest Home (with home health)  In-House Referral:  NA  Discharge planning Services  CM Consult  Post Acute Care Choice:  Home Health Choice offered to:  Adult Children  HH Arranged:  RN, Speech Therapy HH Agency:  [facility]  Status of Service:  Completed, signed off  Malcolm MetroChildress, Alawna Graybeal Demske, RN 10/26/2016, 3:21 PM

## 2016-10-31 ENCOUNTER — Emergency Department (HOSPITAL_COMMUNITY): Payer: Medicare Other

## 2016-10-31 ENCOUNTER — Inpatient Hospital Stay (HOSPITAL_COMMUNITY)
Admission: EM | Admit: 2016-10-31 | Discharge: 2016-11-05 | DRG: 871 | Payer: Medicare Other | Attending: Internal Medicine | Admitting: Internal Medicine

## 2016-10-31 ENCOUNTER — Encounter (HOSPITAL_COMMUNITY): Payer: Self-pay

## 2016-10-31 DIAGNOSIS — E876 Hypokalemia: Secondary | ICD-10-CM | POA: Diagnosis not present

## 2016-10-31 DIAGNOSIS — J690 Pneumonitis due to inhalation of food and vomit: Secondary | ICD-10-CM | POA: Diagnosis present

## 2016-10-31 DIAGNOSIS — K56609 Unspecified intestinal obstruction, unspecified as to partial versus complete obstruction: Secondary | ICD-10-CM

## 2016-10-31 DIAGNOSIS — N390 Urinary tract infection, site not specified: Secondary | ICD-10-CM

## 2016-10-31 DIAGNOSIS — R410 Disorientation, unspecified: Secondary | ICD-10-CM | POA: Diagnosis not present

## 2016-10-31 DIAGNOSIS — N3000 Acute cystitis without hematuria: Secondary | ICD-10-CM | POA: Diagnosis present

## 2016-10-31 DIAGNOSIS — E119 Type 2 diabetes mellitus without complications: Secondary | ICD-10-CM | POA: Diagnosis present

## 2016-10-31 DIAGNOSIS — D649 Anemia, unspecified: Secondary | ICD-10-CM | POA: Diagnosis present

## 2016-10-31 DIAGNOSIS — I10 Essential (primary) hypertension: Secondary | ICD-10-CM | POA: Diagnosis present

## 2016-10-31 DIAGNOSIS — Z79891 Long term (current) use of opiate analgesic: Secondary | ICD-10-CM

## 2016-10-31 DIAGNOSIS — E8809 Other disorders of plasma-protein metabolism, not elsewhere classified: Secondary | ICD-10-CM | POA: Diagnosis present

## 2016-10-31 DIAGNOSIS — L89211 Pressure ulcer of right hip, stage 1: Secondary | ICD-10-CM | POA: Diagnosis present

## 2016-10-31 DIAGNOSIS — K567 Ileus, unspecified: Secondary | ICD-10-CM | POA: Diagnosis not present

## 2016-10-31 DIAGNOSIS — J9811 Atelectasis: Secondary | ICD-10-CM | POA: Diagnosis present

## 2016-10-31 DIAGNOSIS — R0602 Shortness of breath: Secondary | ICD-10-CM

## 2016-10-31 DIAGNOSIS — Z66 Do not resuscitate: Secondary | ICD-10-CM | POA: Diagnosis present

## 2016-10-31 DIAGNOSIS — R609 Edema, unspecified: Secondary | ICD-10-CM | POA: Diagnosis present

## 2016-10-31 DIAGNOSIS — A419 Sepsis, unspecified organism: Principal | ICD-10-CM | POA: Diagnosis present

## 2016-10-31 DIAGNOSIS — Z79899 Other long term (current) drug therapy: Secondary | ICD-10-CM

## 2016-10-31 DIAGNOSIS — F0391 Unspecified dementia with behavioral disturbance: Secondary | ICD-10-CM | POA: Diagnosis present

## 2016-10-31 DIAGNOSIS — R9431 Abnormal electrocardiogram [ECG] [EKG]: Secondary | ICD-10-CM | POA: Diagnosis present

## 2016-10-31 DIAGNOSIS — R1312 Dysphagia, oropharyngeal phase: Secondary | ICD-10-CM | POA: Diagnosis present

## 2016-10-31 DIAGNOSIS — G9340 Encephalopathy, unspecified: Secondary | ICD-10-CM | POA: Diagnosis present

## 2016-10-31 DIAGNOSIS — J811 Chronic pulmonary edema: Secondary | ICD-10-CM

## 2016-10-31 DIAGNOSIS — R110 Nausea: Secondary | ICD-10-CM

## 2016-10-31 DIAGNOSIS — Z7189 Other specified counseling: Secondary | ICD-10-CM | POA: Diagnosis not present

## 2016-10-31 DIAGNOSIS — R600 Localized edema: Secondary | ICD-10-CM | POA: Diagnosis present

## 2016-10-31 DIAGNOSIS — R4182 Altered mental status, unspecified: Secondary | ICD-10-CM | POA: Diagnosis present

## 2016-10-31 DIAGNOSIS — F0390 Unspecified dementia without behavioral disturbance: Secondary | ICD-10-CM | POA: Diagnosis present

## 2016-10-31 DIAGNOSIS — Z515 Encounter for palliative care: Secondary | ICD-10-CM

## 2016-10-31 LAB — GLUCOSE, CAPILLARY: Glucose-Capillary: 126 mg/dL — ABNORMAL HIGH (ref 65–99)

## 2016-10-31 LAB — COMPREHENSIVE METABOLIC PANEL
ALK PHOS: 89 U/L (ref 38–126)
ALT: 11 U/L — ABNORMAL LOW (ref 14–54)
ANION GAP: 13 (ref 5–15)
AST: 22 U/L (ref 15–41)
Albumin: 2.9 g/dL — ABNORMAL LOW (ref 3.5–5.0)
BILIRUBIN TOTAL: 0.6 mg/dL (ref 0.3–1.2)
BUN: 19 mg/dL (ref 6–20)
CALCIUM: 9 mg/dL (ref 8.9–10.3)
CO2: 21 mmol/L — ABNORMAL LOW (ref 22–32)
Chloride: 104 mmol/L (ref 101–111)
Creatinine, Ser: 0.86 mg/dL (ref 0.44–1.00)
GFR, EST NON AFRICAN AMERICAN: 59 mL/min — AB (ref >60)
Glucose, Bld: 123 mg/dL — ABNORMAL HIGH (ref 65–99)
Potassium: 4.1 mmol/L (ref 3.5–5.1)
SODIUM: 138 mmol/L (ref 135–145)
TOTAL PROTEIN: 7.3 g/dL (ref 6.5–8.1)

## 2016-10-31 LAB — CBC WITH DIFFERENTIAL/PLATELET
BASOS ABS: 0 10*3/uL (ref 0.0–0.1)
BASOS PCT: 0 %
EOS PCT: 0 %
Eosinophils Absolute: 0 10*3/uL (ref 0.0–0.7)
HEMATOCRIT: 38.1 % (ref 36.0–46.0)
Hemoglobin: 12 g/dL (ref 12.0–15.0)
LYMPHS PCT: 11 %
Lymphs Abs: 1 10*3/uL (ref 0.7–4.0)
MCH: 28.3 pg (ref 26.0–34.0)
MCHC: 31.5 g/dL (ref 30.0–36.0)
MCV: 89.9 fL (ref 78.0–100.0)
MONO ABS: 0.9 10*3/uL (ref 0.1–1.0)
Monocytes Relative: 10 %
NEUTROS ABS: 6.9 10*3/uL (ref 1.7–7.7)
Neutrophils Relative %: 79 %
Platelets: 165 10*3/uL (ref 150–400)
RBC: 4.24 MIL/uL (ref 3.87–5.11)
RDW: 14.5 % (ref 11.5–15.5)
WBC: 8.7 10*3/uL (ref 4.0–10.5)

## 2016-10-31 LAB — URINALYSIS, MICROSCOPIC (REFLEX)

## 2016-10-31 LAB — URINALYSIS, ROUTINE W REFLEX MICROSCOPIC
BILIRUBIN URINE: NEGATIVE
Glucose, UA: 100 mg/dL — AB
KETONES UR: 15 mg/dL — AB
NITRITE: POSITIVE — AB
PROTEIN: 100 mg/dL — AB
Specific Gravity, Urine: 1.02 (ref 1.005–1.030)
pH: 6.5 (ref 5.0–8.0)

## 2016-10-31 LAB — CBG MONITORING, ED: GLUCOSE-CAPILLARY: 114 mg/dL — AB (ref 65–99)

## 2016-10-31 MED ORDER — FERROUS SULFATE 325 (65 FE) MG PO TABS
325.0000 mg | ORAL_TABLET | Freq: Every day | ORAL | Status: DC
  Administered 2016-11-01 – 2016-11-05 (×5): 325 mg via ORAL
  Filled 2016-10-31 (×5): qty 1

## 2016-10-31 MED ORDER — DEXTROSE 5 % IV SOLN
1.0000 g | Freq: Once | INTRAVENOUS | Status: AC
  Administered 2016-10-31: 1 g via INTRAVENOUS
  Filled 2016-10-31: qty 10

## 2016-10-31 MED ORDER — POLYETHYLENE GLYCOL 3350 17 G PO PACK
17.0000 g | PACK | Freq: Two times a day (BID) | ORAL | Status: DC
  Administered 2016-10-31 – 2016-11-05 (×9): 17 g via ORAL
  Filled 2016-10-31 (×9): qty 1

## 2016-10-31 MED ORDER — LINACLOTIDE 145 MCG PO CAPS
290.0000 ug | ORAL_CAPSULE | Freq: Every day | ORAL | Status: DC
  Administered 2016-11-01 – 2016-11-05 (×5): 290 ug via ORAL
  Filled 2016-10-31 (×5): qty 2

## 2016-10-31 MED ORDER — LEVOFLOXACIN IN D5W 500 MG/100ML IV SOLN
500.0000 mg | INTRAVENOUS | Status: DC
  Administered 2016-10-31: 500 mg via INTRAVENOUS
  Filled 2016-10-31: qty 100

## 2016-10-31 MED ORDER — DONEPEZIL HCL 5 MG PO TABS
10.0000 mg | ORAL_TABLET | Freq: Every day | ORAL | Status: DC
  Administered 2016-10-31 – 2016-11-04 (×5): 10 mg via ORAL
  Filled 2016-10-31 (×5): qty 2

## 2016-10-31 MED ORDER — DEXTROSE 5 % IV SOLN
1.0000 g | INTRAVENOUS | Status: DC
  Filled 2016-10-31: qty 10

## 2016-10-31 MED ORDER — ALPRAZOLAM 0.25 MG PO TABS
0.2500 mg | ORAL_TABLET | Freq: Two times a day (BID) | ORAL | Status: DC | PRN
  Administered 2016-11-01: 0.25 mg via ORAL
  Filled 2016-10-31: qty 1

## 2016-10-31 MED ORDER — SODIUM CHLORIDE 0.9 % IV SOLN
INTRAVENOUS | Status: DC
  Administered 2016-10-31 – 2016-11-02 (×6): via INTRAVENOUS

## 2016-10-31 MED ORDER — SODIUM CHLORIDE 0.9 % IV BOLUS (SEPSIS)
1000.0000 mL | Freq: Once | INTRAVENOUS | Status: AC
  Administered 2016-10-31: 1000 mL via INTRAVENOUS

## 2016-10-31 MED ORDER — ALPRAZOLAM 1 MG PO TABS
1.0000 mg | ORAL_TABLET | Freq: Every day | ORAL | Status: DC
  Administered 2016-10-31 – 2016-11-03 (×4): 1 mg via ORAL
  Filled 2016-10-31 (×4): qty 1

## 2016-10-31 MED ORDER — ACETAMINOPHEN 500 MG PO TABS
500.0000 mg | ORAL_TABLET | Freq: Three times a day (TID) | ORAL | Status: DC | PRN
  Administered 2016-11-01 – 2016-11-02 (×2): 500 mg via ORAL
  Filled 2016-10-31 (×2): qty 1

## 2016-10-31 MED ORDER — LEVOFLOXACIN IN D5W 500 MG/100ML IV SOLN: 500.0000 mg | INTRAVENOUS | Status: DC

## 2016-10-31 MED ORDER — DIVALPROEX SODIUM 250 MG PO DR TAB
250.0000 mg | DELAYED_RELEASE_TABLET | Freq: Two times a day (BID) | ORAL | Status: DC
  Administered 2016-10-31: 250 mg via ORAL
  Filled 2016-10-31 (×2): qty 1

## 2016-10-31 MED ORDER — BISACODYL 10 MG RE SUPP: 10.0000 mg | Freq: Every day | RECTAL | Status: DC | PRN

## 2016-10-31 MED ORDER — ENOXAPARIN SODIUM 30 MG/0.3ML ~~LOC~~ SOLN
30.0000 mg | SUBCUTANEOUS | Status: DC
  Administered 2016-10-31 – 2016-11-04 (×5): 30 mg via SUBCUTANEOUS
  Filled 2016-10-31 (×5): qty 0.3

## 2016-10-31 MED ORDER — HALOPERIDOL 2 MG PO TABS: 1.0000 mg | ORAL_TABLET | Freq: Every day | ORAL | Status: DC | PRN

## 2016-10-31 MED ORDER — TRAMADOL HCL 50 MG PO TABS
50.0000 mg | ORAL_TABLET | Freq: Two times a day (BID) | ORAL | Status: DC | PRN
  Filled 2016-10-31 (×2): qty 1

## 2016-10-31 NOTE — ED Notes (Signed)
Pt lethargic, responds to pain, pupils pinpoint. Congested np cough noted. Swelling to left ankle noted.

## 2016-10-31 NOTE — H&P (Addendum)
History and Physical    Ashlee Barron NWG:[id number] DOB: May 07, 1928 DOA: 10/31/2016  PCP: No PCP Per Patient   Patient coming from: Home.  Chief Complaint: Altered mental status.  HPI: Ashlee Barron is a 82 y.o. female with medical history significant of constipation, dementia, type 2 diabetes, hypertension who was brought from [facility] due to altered status.  Per notes, the patient was talking this morning. She took enough from 9 to 11:30, then her nephew woke the patient up noticing that she was nonverbal and altered. Apparently, per [facility] staff, this happens sometimes with the patient due to illness or stress. I spoke to her daughter Ashlee Barron, who stated that the patient until about 2 weeks ago was more talkative and walking. Since then, she has been using mostly a wheelchair to move around. She last saw her mother on Friday and stated that she was able to converse with her.  ED Course: Her CBC was normal with a WBC of 8.7 and hemoglobin of 12.0 g/dL. BUN was 21 and glucose was 123 mg/dL. Albumin level was 2.9 g/dL. Her urinalysis showed positive nitrites, small leukocyte esterase, proteinuria, many bacteria, WBC from 6-30 per HPF.  Imaging: Her chest radiograph showed medial left basilar opacities suspicious for aspiration, pneumonia or atelectasis. There was a small amount of right fissural pleural fluid. CT scan of the head showed atrophy with small vessel ischemic changes of the brain, but no evidence of acute intracranial abnormality.  Review of Systems: Unable to review due to mental status..    Past Medical History:  Diagnosis Date  . Constipation   . Dementia   . Diabetes mellitus without complication (HCC)   . Hypertension     Past Surgical History:  Procedure Laterality Date  . ABDOMINAL HYSTERECTOMY    . APPENDECTOMY    . CHOLECYSTECTOMY       reports that she has never smoked. She has never used smokeless tobacco. She reports that she does not drink  alcohol or use drugs.  No Known Allergies  Family History  Problem Relation Age of Onset  . Family history unknown: Yes    Prior to Admission medications   Medication Sig Start Date End Date Taking? Authorizing Provider  acetaminophen (TYLENOL) 500 MG tablet Take 500 mg by mouth 3 (three) times daily as needed for mild pain.    Yes Historical Provider, MD  ALPRAZolam (XANAX) 0.25 MG tablet Take 0.25 mg by mouth daily.    Yes Historical Provider, MD  ALPRAZolam Prudy Feeler(XANAX) 1 MG tablet Take 1 mg by mouth at bedtime.   Yes Historical Provider, MD  bisacodyl (DULCOLAX) 10 MG suppository Place 1 suppository (10 mg total) rectally daily as needed for moderate constipation. 09/11/16  Yes Pearson GrippeJames Kim, MD  divalproex (DEPAKOTE) 250 MG DR tablet Take 250 mg by mouth 2 (two) times daily.   Yes Historical Provider, MD  donepezil (ARICEPT) 10 MG tablet Take 10 mg by mouth at bedtime.   Yes Historical Provider, MD  ferrous sulfate 325 (65 FE) MG tablet Take 325 mg by mouth daily with breakfast.   Yes Historical Provider, MD  haloperidol (HALDOL) 1 MG tablet Take 1 tablet (1 mg total) by mouth daily as needed for agitation. 09/11/16  Yes Pearson GrippeJames Kim, MD  linaclotide [facility](LINZESS) 290 MCG CAPS capsule Take 1 capsule (290 mcg total) by mouth daily before breakfast. 09/11/16  Yes Pearson GrippeJames Kim, MD  polyethylene glycol (MIRALAX / GLYCOLAX) packet Take 17 g by mouth 2 (two)  times daily. 09/11/16  Yes Pearson Grippe, MD  traMADol (ULTRAM) 50 MG tablet Take 50 mg by mouth every 12 (twelve) hours as needed for moderate pain or severe pain.    Yes Historical Provider, MD  cephALEXin (KEFLEX) 500 MG capsule Take 1 capsule (500 mg total) by mouth 2 (two) times daily. Patient not taking: Reported on 10/31/2016 10/21/16   Pearson Grippe, MD  levofloxacin (LEVAQUIN) 750 MG tablet Take 1 tablet (750 mg total) by mouth daily. X 6 days Patient not taking: Reported on 10/31/2016 10/27/16   Marily Memos, MD    Physical Exam:  Constitutional: NAD,  calm, comfortable Vitals:   10/31/16 1631 10/31/16 1700 10/31/16 1800 10/31/16 1806  BP:  (!) 92/44 (!) 117/53 (!) 114/54  Pulse: 73 71  73  Resp: 23 23 15 16   Temp:      TempSrc:      SpO2: 100% 100%  100%   Eyes: PERRL, lids and conjunctivae normal ENMT: Mucous membranes are moist. Posterior pharynx clear of any exudate or lesions.Normal dentition.  Neck: normal, supple, no masses, no thyromegaly Respiratory: Decreased breath sounds on bases, otherwise clear to auscultation bilaterally, no wheezing, no crackles. No accessory muscle use.  Cardiovascular: Regular rate and rhythm, no murmurs / rubs / gallops. No extremity edema. 2+ pedal pulses. No carotid bruits.  Abdomen: Soft, no tenderness, no masses palpated. No hepatosplenomegaly. Bowel sounds positive.  Musculoskeletal: Left foot edema to lower ankle, no clubbing / cyanosis. Good ROM, no contractures. Normal muscle tone.  Skin: no rashes, lesions, ulcers on limited skin exam. No sacral decubiti ulcers seen. Neurologic: Obtunded, unable to fully evaluate. Responds to tactile stimuli. Psychiatric: Obtunded.   Labs on Admission: I have personally reviewed following labs and imaging studies  CBC:  Recent Labs Lab 10/26/16 1216 10/31/16 1451  WBC 14.7* 8.7  NEUTROABS 13.0* 6.9  HGB 12.3 12.0  HCT 38.2 38.1  MCV 87.0 89.9  PLT 205 165   Basic Metabolic Panel:  Recent Labs Lab 10/26/16 1216 10/31/16 1451  NA 137 138  K 3.5 4.1  CL 102 104  CO2 26 21*  GLUCOSE 151* 123*  BUN 11 19  CREATININE 0.69 0.86  CALCIUM 9.1 9.0   GFR: Estimated Creatinine Clearance: 41.5 mL/min (by C-G formula based on SCr of 0.86 mg/dL). Liver Function Tests:  Recent Labs Lab 10/26/16 1216 10/31/16 1451  AST 37 22  ALT 21 11*  ALKPHOS 99 89  BILITOT 0.6 0.6  PROT 7.7 7.3  ALBUMIN 3.5 2.9*   No results for input(s): LIPASE, AMYLASE in the last 168 hours. No results for input(s): AMMONIA in the last 168 hours. Coagulation  Profile: No results for input(s): INR, PROTIME in the last 168 hours. Cardiac Enzymes: No results for input(s): CKTOTAL, CKMB, CKMBINDEX, TROPONINI in the last 168 hours. BNP (last 3 results) No results for input(s): PROBNP in the last 8760 hours. HbA1C: No results for input(s): HGBA1C in the last 72 hours. CBG:  Recent Labs Lab 10/31/16 1343  GLUCAP 114*   Lipid Profile: No results for input(s): CHOL, HDL, LDLCALC, TRIG, CHOLHDL, LDLDIRECT in the last 72 hours. Thyroid Function Tests: No results for input(s): TSH, T4TOTAL, FREET4, T3FREE, THYROIDAB in the last 72 hours. Anemia Panel: No results for input(s): VITAMINB12, FOLATE, FERRITIN, TIBC, IRON, RETICCTPCT in the last 72 hours. Urine analysis:    Component Value Date/Time   COLORURINE YELLOW 10/31/2016 1347   APPEARANCEUR CLEAR 10/31/2016 1347   APPEARANCEUR Clear 12/18/2013 2115  LABSPEC 1.020 10/31/2016 1347   LABSPEC 1.008 12/18/2013 2115   PHURINE 6.5 10/31/2016 1347   GLUCOSEU 100 (A) 10/31/2016 1347   GLUCOSEU Negative 12/18/2013 2115   HGBUR TRACE (A) 10/31/2016 1347   BILIRUBINUR NEGATIVE 10/31/2016 1347   BILIRUBINUR Negative 12/18/2013 2115   KETONESUR 15 (A) 10/31/2016 1347   PROTEINUR 100 (A) 10/31/2016 1347   UROBILINOGEN 0.2 05/11/2013 1501   NITRITE POSITIVE (A) 10/31/2016 1347   LEUKOCYTESUR SMALL (A) 10/31/2016 1347   LEUKOCYTESUR Negative 12/18/2013 2115    Radiological Exams on Admission: Dg Chest 2 View  Result Date: 10/31/2016 CLINICAL DATA:  Altered mental status EXAM: CHEST  2 VIEW COMPARISON:  Chest radiograph 10/26/2016 FINDINGS: Aeration of the lung bases is improved compared to the prior study. There persistent medial left basilar opacities with no new evidence of consolidation. Linear opacities along the right min major or fissure may indicate a small amount of pleural fluid. No pneumothorax or sizable pleural effusion. Unchanged mid thoracic compression deformity. IMPRESSION: 1.  Unchanged medial left basilar opacities, which could represent aspiration, pneumonia or atelectasis. 2. Small amount of right fissural pleural fluid. Electronically Signed   By: Deatra Robinson M.D.   On: 10/31/2016 14:25   Ct Head Wo Contrast  Result Date: 10/31/2016 CLINICAL DATA:  Altered mental status, dementia EXAM: CT HEAD WITHOUT CONTRAST TECHNIQUE: Contiguous axial images were obtained from the base of the skull through the vertex without intravenous contrast. COMPARISON:  10/20/2016 FINDINGS: Brain: No evidence of acute infarction, hemorrhage, hydrocephalus, extra-axial collection or mass lesion/mass effect. Vascular: Intracranial atherosclerosis. Skull: Normal. Negative for fracture or focal lesion. Sinuses/Orbits: The visualized paranasal sinuses are essentially clear. The mastoid air cells are unopacified. Other: Global cortical atrophy.  Secondary ventricular prominence. Subcortical white matter and periventricular small vessel ischemic changes. IMPRESSION: No evidence of acute intracranial abnormality. Atrophy with small vessel ischemic changes. Electronically Signed   By: Charline Bills M.D.   On: 10/31/2016 14:36    EKG: Independently reviewed.  Vent. rate 98 BPM PR interval * ms QRS duration 79 ms QT/QTc 332/424 ms P-R-T axes 53 31 86 Sinus rhythm Atrial premature complex Anterior infarct, old  Assessment/Plan Principal Problem:   Sepsis secondary to UTI (HCC) Admit to telemetry/inpatient. She received 1 L normal saline bolus in the emergency department. Continue IV hydration. Continue cefepime per pharmacy. Follow-up blood culture sensitivity. Follow-up urine culture and sensitivity.  Active Problems:   Aspiration pneumonia (HCC) vs atelectasis. Continue supplemental oxygen Continue Levaquin started in the emergency department. Check sputum Gram stain, C&S. Bronchodilators as needed.    Altered mental status Secondary to above. Continue treatment for UTI and  aspiration pneumonia. Supportive care.    Type 2 diabetes mellitus (HCC) Last hemoglobin A1c was 6.0% on 10/20/2016. Carbohydrate modified diet. CBG monitoring with regular insulin sliding scale.     Dementia with behavioral disturbance Continue Depakote 250 mg by mouth twice a day. Continue Aricept 10 mg by mouth at bedtime. Continue alprazolam and Haldol as needed.    Essential hypertension Not on antihypertensives. Low-sodium diet. Monitor blood pressure.    Abnormal EKG Check echocardiogram in the morning.    Edema of the left foot Unknown if the patient suffered a trauma in the area. Will check radiographical the left foot in the morning.    Hypoalbuminemia Consider nutritional consult.    DVT prophylaxis: Lovenox SQ. Code Status: DNR/DNI. Family Communication: Her daughter Ashlee Salk was present in the ED room. Disposition Plan: Admit for IV antibiotic therapy  for several days. Consults called:  Admission status: Inpatient/telemetry.   Bobette Moavid Manuel Ortiz MD [facility] Pager [phone].  If 7PM-7AM, please contact night-coverage [email or web address] Password [email or web address]  10/31/2016, 7:30 PM

## 2016-10-31 NOTE — ED Notes (Signed)
cbg of 114. 

## 2016-10-31 NOTE — ED Notes (Signed)
Family states they are going to get something to eat and they will return.

## 2016-10-31 NOTE — ED Notes (Signed)
Patient transported to X-ray 

## 2016-10-31 NOTE — ED Triage Notes (Signed)
[facility] for altered mental status. [facility] and spoke to De LandKim, the nurse taking care of pt. She said pt was talking this morning. States she took a nap from 0900 - 11: 30 when the nephew woke the pt up. States pt woke up and was not talking and was altered. Selena BattenKim stated this happens sometimes with pt.

## 2016-10-31 NOTE — ED Provider Notes (Signed)
[facility]-EMERGENCY DEPT Provider Note   CSN: [id number] Arrival date & time: 10/31/16  1312  By signing my name below, I, [name], attest that this documentation has been prepared under the direction and in the presence of Lavera Guiseana Duo Edwing Figley, MD. Electronically Signed: [name], ED Scribe. 10/31/16. 1:46 PM.   History   Chief Complaint Chief Complaint  Patient presents with  . Altered Mental Status   LEVEL FIVE CAVEAT: DEMENTIA  HPI HPI Comments: Ashlee Barron is a 82 y.o. female with a h/o dementia who presents to the Emergency Department due to AMS onset this morning. Per son, he was visiting her at her nursing home and states she was not acting normally noting "she was balled up in the bed and didn't open her eyes when I spoke to her". Per son, nursing home staff said she was behaving normally last night. She has a foley catheter in place and is being treated for a UTI. Pt has a h/o dementia and is not answering provider's questions.   The history is provided by the patient and medical records. No language interpreter was used.    Past Medical History:  Diagnosis Date  . Constipation   . Dementia   . Diabetes mellitus without complication (HCC)   . Hypertension     Patient Active Problem List   Diagnosis Date Noted  . Altered mental status 10/19/2016  . Constipation 09/09/2016  . Dehydration 09/09/2016  . Diabetes (HCC) 09/09/2016    Past Surgical History:  Procedure Laterality Date  . ABDOMINAL HYSTERECTOMY    . APPENDECTOMY    . CHOLECYSTECTOMY      OB History    No data available       Home Medications    Prior to Admission medications   Medication Sig Start Date End Date Taking? Authorizing Provider  acetaminophen (TYLENOL) 500 MG tablet Take 500 mg by mouth 3 (three) times daily as needed for mild pain.    Yes Historical Provider, MD  ALPRAZolam (XANAX) 0.25 MG tablet Take 0.25 mg by mouth daily.    Yes Historical Provider, MD  ALPRAZolam  Prudy Feeler(XANAX) 1 MG tablet Take 1 mg by mouth at bedtime.   Yes Historical Provider, MD  bisacodyl (DULCOLAX) 10 MG suppository Place 1 suppository (10 mg total) rectally daily as needed for moderate constipation. 09/11/16  Yes Pearson GrippeJames Kim, MD  divalproex (DEPAKOTE) 250 MG DR tablet Take 250 mg by mouth 2 (two) times daily.   Yes Historical Provider, MD  donepezil (ARICEPT) 10 MG tablet Take 10 mg by mouth at bedtime.   Yes Historical Provider, MD  ferrous sulfate 325 (65 FE) MG tablet Take 325 mg by mouth daily with breakfast.   Yes Historical Provider, MD  haloperidol (HALDOL) 1 MG tablet Take 1 tablet (1 mg total) by mouth daily as needed for agitation. 09/11/16  Yes Pearson GrippeJames Kim, MD  linaclotide [facility](LINZESS) 290 MCG CAPS capsule Take 1 capsule (290 mcg total) by mouth daily before breakfast. 09/11/16  Yes Pearson GrippeJames Kim, MD  polyethylene glycol (MIRALAX / GLYCOLAX) packet Take 17 g by mouth 2 (two) times daily. 09/11/16  Yes Pearson GrippeJames Kim, MD  traMADol (ULTRAM) 50 MG tablet Take 50 mg by mouth every 12 (twelve) hours as needed for moderate pain or severe pain.    Yes Historical Provider, MD  cephALEXin (KEFLEX) 500 MG capsule Take 1 capsule (500 mg total) by mouth 2 (two) times daily. Patient not taking: Reported on 10/31/2016 10/21/16   Pearson GrippeJames Kim,  MD  levofloxacin (LEVAQUIN) 750 MG tablet Take 1 tablet (750 mg total) by mouth daily. X 6 days Patient not taking: Reported on 10/31/2016 10/27/16   Marily MemosJason Mesner, MD    Family History Family History  Problem Relation Age of Onset  . Family history unknown: Yes    Social History Social History  Substance Use Topics  . Smoking status: Never Smoker  . Smokeless tobacco: Never Used  . Alcohol use No     Allergies   Patient has no known allergies.  Review of Systems Review of Systems  Unable to perform ROS: Dementia   Physical Exam Updated Vital Signs BP (!) 92/44   Pulse 71   Temp 99.9 F (37.7 C) (Rectal)   Resp 23   SpO2 100%   Physical  Exam Nursing note and vitals reviewed. Constitutional: Non-toxic, and in no acute distress Head: Normocephalic and atraumatic.  Mouth/Throat: Oropharynx is clear and moist. Dry mucous membranes.  Neck: Normal range of motion. Neck supple.  Cardiovascular: Tachycardic rate and regular rhythm.   Pulmonary/Chest: Effort normal with coarse bilateral breath sounds. Abdominal: Soft. There is no tenderness. There is no rebound and no guarding.  Musculoskeletal: Normal range of motion.  Neurological: Alert, no facial droop, moves all extremities symmetrically. Nonverbal.  Skin: Skin is warm and dry. Stage one pressure ulcer to the right hip.  Psychiatric: Cooperative  ED Treatments / Results  Labs (all labs ordered are listed, but only abnormal results are displayed) Labs Reviewed  COMPREHENSIVE METABOLIC PANEL - Abnormal; Notable for the following:       Result Value   CO2 21 (*)    Glucose, Bld 123 (*)    Albumin 2.9 (*)    ALT 11 (*)    GFR calc non Af Amer 59 (*)    All other components within normal limits  URINALYSIS, ROUTINE W REFLEX MICROSCOPIC - Abnormal; Notable for the following:    Glucose, UA 100 (*)    Hgb urine dipstick TRACE (*)    Ketones, ur 15 (*)    Protein, ur 100 (*)    Nitrite POSITIVE (*)    Leukocytes, UA SMALL (*)    All other components within normal limits  URINALYSIS, MICROSCOPIC (REFLEX) - Abnormal; Notable for the following:    Bacteria, UA MANY (*)    Squamous Epithelial / LPF TOO NUMEROUS TO COUNT (*)    All other components within normal limits  CBG MONITORING, ED - Abnormal; Notable for the following:    Glucose-Capillary 114 (*)    All other components within normal limits  URINE CULTURE  CBC WITH DIFFERENTIAL/PLATELET    EKG  EKG Interpretation  Date/Time:  Monday October 31 2016 13:32:43 EST Ventricular Rate:  98 PR Interval:    QRS Duration: 79 QT Interval:  332 QTC Calculation: 424 R Axis:   31 Text Interpretation:  Sinus rhythm  Atrial premature complex Anterior infarct, old similar to prior EKG  Confirmed by Suhana Wilner MD, Tafari Humiston [id number]) on 10/31/2016 1:45:59 PM       Radiology Dg Chest 2 View  Result Date: 10/31/2016 CLINICAL DATA:  Altered mental status EXAM: CHEST  2 VIEW COMPARISON:  Chest radiograph 10/26/2016 FINDINGS: Aeration of the lung bases is improved compared to the prior study. There persistent medial left basilar opacities with no new evidence of consolidation. Linear opacities along the right min major or fissure may indicate a small amount of pleural fluid. No pneumothorax or sizable pleural effusion. Unchanged mid thoracic  compression deformity. IMPRESSION: 1. Unchanged medial left basilar opacities, which could represent aspiration, pneumonia or atelectasis. 2. Small amount of right fissural pleural fluid. Electronically Signed   By: Deatra Robinson M.D.   On: 10/31/2016 14:25   Ct Head Wo Contrast  Result Date: 10/31/2016 CLINICAL DATA:  Altered mental status, dementia EXAM: CT HEAD WITHOUT CONTRAST TECHNIQUE: Contiguous axial images were obtained from the base of the skull through the vertex without intravenous contrast. COMPARISON:  10/20/2016 FINDINGS: Brain: No evidence of acute infarction, hemorrhage, hydrocephalus, extra-axial collection or mass lesion/mass effect. Vascular: Intracranial atherosclerosis. Skull: Normal. Negative for fracture or focal lesion. Sinuses/Orbits: The visualized paranasal sinuses are essentially clear. The mastoid air cells are unopacified. Other: Global cortical atrophy.  Secondary ventricular prominence. Subcortical white matter and periventricular small vessel ischemic changes. IMPRESSION: No evidence of acute intracranial abnormality. Atrophy with small vessel ischemic changes. Electronically Signed   By: Charline Bills M.D.   On: 10/31/2016 14:36    Procedures Procedures  COORDINATION OF CARE: 1:44 PM Pt was unresponsive to provider's questions. Discussed next steps with  her son.  Medications Ordered in ED Medications  cefTRIAXone (ROCEPHIN) 1 g in dextrose 5 % 50 mL IVPB (1 g Intravenous New Bag/Given 10/31/16 1706)  levofloxacin (LEVAQUIN) IVPB 500 mg (not administered)  sodium chloride 0.9 % bolus 1,000 mL (not administered)  sodium chloride 0.9 % bolus 1,000 mL (0 mLs Intravenous Stopped 10/31/16 1641)     Initial Impression / Assessment and Plan / ED Course  I have reviewed the triage vital signs and the nursing notes.  Pertinent labs & imaging results that were available during my care of the patient were reviewed by me and considered in my medical decision making (see chart for details).  Clinical Course     82 year old female with history of dementia and who presents with altered mental status. On my evaluation is alert, but does not obey commands and is nonverbal. Per patient's son, this is not her baseline.  Workup here does suggest UTI in the setting of her chronic indwelling Foley. Is given ceftriaxone. Chest x-ray also with persistent left lower lobe infiltrate. Was recently treated for aspiration a few days ago on a visit to the ED on chart review. At that time she did have a syncopal episode versus unresponsiveness episode, but her middle status was at baseline and she was discharged home with Levaquin. Does have concern for ongoing aspiration pneumonia on her lung exam and x-ray, and will continue her Levaquin here via IV.  Remainder of blood work reassuring, and CT head visualized and shows no acute processes. Given her persistent mental status changes I discussed with Dr. Robb Matar who will admit for ongoing management.   I personally performed the services described in this documentation, which was scribed in my presence. The recorded information has been reviewed and is accurate.  Final Clinical Impressions(s) / ED Diagnoses   Final diagnoses:  Delirium  Lower urinary tract infectious disease    New Prescriptions New Prescriptions    No medications on file     Lavera Guise, MD 10/31/16 1730

## 2016-10-31 NOTE — ED Notes (Signed)
EDP in talking with family member

## 2016-11-01 ENCOUNTER — Inpatient Hospital Stay (HOSPITAL_COMMUNITY): Payer: Medicare Other

## 2016-11-01 DIAGNOSIS — R9431 Abnormal electrocardiogram [ECG] [EKG]: Secondary | ICD-10-CM

## 2016-11-01 DIAGNOSIS — J690 Pneumonitis due to inhalation of food and vomit: Secondary | ICD-10-CM

## 2016-11-01 DIAGNOSIS — N3000 Acute cystitis without hematuria: Secondary | ICD-10-CM

## 2016-11-01 DIAGNOSIS — R600 Localized edema: Secondary | ICD-10-CM | POA: Diagnosis present

## 2016-11-01 DIAGNOSIS — E8809 Other disorders of plasma-protein metabolism, not elsewhere classified: Secondary | ICD-10-CM | POA: Diagnosis present

## 2016-11-01 DIAGNOSIS — R410 Disorientation, unspecified: Secondary | ICD-10-CM

## 2016-11-01 LAB — CBC WITH DIFFERENTIAL/PLATELET
Basophils Absolute: 0 10*3/uL (ref 0.0–0.1)
Basophils Relative: 0 %
Eosinophils Absolute: 0 10*3/uL (ref 0.0–0.7)
Eosinophils Relative: 0 %
HCT: 34.6 % — ABNORMAL LOW (ref 36.0–46.0)
HEMOGLOBIN: 10.7 g/dL — AB (ref 12.0–15.0)
LYMPHS PCT: 25 %
Lymphs Abs: 1.5 10*3/uL (ref 0.7–4.0)
MCH: 27 pg (ref 26.0–34.0)
MCHC: 30.9 g/dL (ref 30.0–36.0)
MCV: 87.4 fL (ref 78.0–100.0)
Monocytes Absolute: 0.6 10*3/uL (ref 0.1–1.0)
Monocytes Relative: 10 %
NEUTROS ABS: 3.7 10*3/uL (ref 1.7–7.7)
NEUTROS PCT: 65 %
Platelets: 142 10*3/uL — ABNORMAL LOW (ref 150–400)
RBC: 3.96 MIL/uL (ref 3.87–5.11)
RDW: 14.2 % (ref 11.5–15.5)
WBC: 5.8 10*3/uL (ref 4.0–10.5)

## 2016-11-01 LAB — GLUCOSE, CAPILLARY
GLUCOSE-CAPILLARY: 84 mg/dL (ref 65–99)
GLUCOSE-CAPILLARY: 81 mg/dL (ref 65–99)
GLUCOSE-CAPILLARY: 139 mg/dL — AB (ref 65–99)
GLUCOSE-CAPILLARY: 234 mg/dL — AB (ref 65–99)

## 2016-11-01 LAB — COMPREHENSIVE METABOLIC PANEL
ALK PHOS: 71 U/L (ref 38–126)
ALT: 10 U/L — AB (ref 14–54)
AST: 20 U/L (ref 15–41)
Albumin: 2.3 g/dL — ABNORMAL LOW (ref 3.5–5.0)
Anion gap: 6 (ref 5–15)
BUN: 25 mg/dL — AB (ref 6–20)
CALCIUM: 8.2 mg/dL — AB (ref 8.9–10.3)
CO2: 24 mmol/L (ref 22–32)
CREATININE: 0.61 mg/dL (ref 0.44–1.00)
Chloride: 109 mmol/L (ref 101–111)
Glucose, Bld: 90 mg/dL (ref 65–99)
Potassium: 4.4 mmol/L (ref 3.5–5.1)
Sodium: 139 mmol/L (ref 135–145)
Total Bilirubin: 0.5 mg/dL (ref 0.3–1.2)
Total Protein: 6.3 g/dL — ABNORMAL LOW (ref 6.5–8.1)

## 2016-11-01 LAB — STREP PNEUMONIAE URINARY ANTIGEN: STREP PNEUMO URINARY ANTIGEN: NEGATIVE

## 2016-11-01 LAB — ECHOCARDIOGRAM COMPLETE

## 2016-11-01 LAB — MRSA PCR SCREENING: MRSA BY PCR: NEGATIVE

## 2016-11-01 MED ORDER — DIVALPROEX SODIUM 125 MG PO CSDR
250.0000 mg | DELAYED_RELEASE_CAPSULE | Freq: Two times a day (BID) | ORAL | Status: DC
  Administered 2016-11-01 – 2016-11-05 (×9): 250 mg via ORAL
  Filled 2016-11-01 (×13): qty 2

## 2016-11-01 MED ORDER — PIPERACILLIN-TAZOBACTAM 3.375 G IVPB
3.3750 g | Freq: Three times a day (TID) | INTRAVENOUS | Status: DC
  Administered 2016-11-01 – 2016-11-04 (×9): 3.375 g via INTRAVENOUS
  Filled 2016-11-01 (×10): qty 50

## 2016-11-01 MED ORDER — TRAMADOL HCL 50 MG PO TABS
50.0000 mg | ORAL_TABLET | Freq: Four times a day (QID) | ORAL | Status: DC | PRN
  Administered 2016-11-01 – 2016-11-04 (×4): 50 mg via ORAL
  Filled 2016-11-01 (×3): qty 1

## 2016-11-01 NOTE — Progress Notes (Signed)
Pt CBG 234, MD made aware, no further orders received.

## 2016-11-01 NOTE — Progress Notes (Signed)
*  PRELIMINARY RESULTS* Echocardiogram 2D Echocardiogram has been performed.  Jeryl Columbialliott, Jenai Scaletta 11/01/2016, 3:49 PM

## 2016-11-01 NOTE — Progress Notes (Signed)
PROGRESS NOTE    Ashlee BradfordMildred F Barron  ZOX:[id number] DOB: Jun 21, 1928 DOA: 10/31/2016 PCP: No PCP Per Patient   Brief Narrative: 82 y.o. female with medical history significant of constipation, dementia, type 2 diabetes, hypertension [facility] mental status.  Assessment & Plan:  #  Altered mental status likely acute encephalopathy in the setting of UTI/PNA in underlying dementia: As per record, at baseline patient can walk and talking but recently she is becoming more confused and weak. -Currently on IV ceftriaxone and IV Levaquin. I will discontinue both and he started on IV Zosyn.  -Follow up culture results. -Head CT showed atrophy with small vessel ischemic changes. -Continue supportive care.  #Acute cystitis without hematuria: Patient is unable to tell me if she has urinary symptoms. Antibiotics on IV Zosyn now. Follow up culture results.  #Left lower lobe opacity, probably atelectasis versus aspiration pneumonia: Antibiotics switched to IV Zosyn. Patient is not hypoxic. Maintaining oxygen saturation in room air.  # Type 2 diabetes mellitus (HCC): Monitor blood sugar level. Last A1c was 6 on November 30.  #Possible dysphagia, oropharyngeal: As per RN patient had episodes of choking this morning. Swallow evaluation requested.   # Dementia, exact type unknown: Likely vascular versus Alzheimer dementia. Patient was calm and comfortable this morning. Continue supportive care. Patient is DO NOT RESUSCITATE. I requested palliative care consult to address goals of care and possible hospice. On Aricept.  #  Edema of left foot: X-ray with soft tissue swelling. No sign of fracture. Elevate the foot and supportive care.  # Normocytic anemia: Drop in hemoglobin today likely due to dilutional. Repeat CBC in the morning. No sign of bleeding.  DVT prophylaxis: Lovenox subcutaneous Code Status: DO NOT RESUSCITATE Family Communication: No family present at  bedside Disposition Plan: Likely discharge to SNF in 1-2 days   Consultants:   None  Discussed with the case manager and social worker regarding discharge planning.  Procedures: None Antimicrobials: Discontinue Levaquin and ceftriaxone and he started Zosyn today.  Subjective: Patient was seen and examined at bedside. Patient was oriented to herself only. Denied headache, dizziness, nausea vomiting chest pain. Review of systems not reliable because of her altered mental status.   Objective: Vitals:   10/31/16 1800 10/31/16 1806 10/31/16 2058 11/01/16 0529  BP: (!) 117/53 (!) 114/54 (!) 98/56 (!) 111/58  Pulse:  73 82 86  Resp: 15 16 16 16   Temp:   98.9 F (37.2 C) 98.5 F (36.9 C)  TempSrc:   Axillary Axillary  SpO2:  100% 100% 100%    Intake/Output Summary (Last 24 hours) at 11/01/16 1340 Last data filed at 11/01/16 1036  Gross per 24 hour  Intake             1810 ml  Output              225 ml  Net             1585 ml   There were no vitals filed for this visit.  Examination:  General exam: Confused elderly female lying in bed looks comfortable.   Respiratory system: Clear to auscultation. Respiratory effort normal. No wheezing or crackle Cardiovascular system: S1 & S2 heard, RRR.  No pedal edema. Gastrointestinal system: Abdomen is nondistended, soft and nontender. Normal bowel sounds heard. Central nervous system: Alert awake and oriented to herself only. Skin: No rashes, lesions or ulcers Psychiatry: Judgement and insight appear impaired. Patient has dementia.  Data Reviewed: I have personally reviewed following labs and imaging studies  CBC:  Recent Labs Lab 10/26/16 1216 10/31/16 1451 11/01/16 0552  WBC 14.7* 8.7 5.8  NEUTROABS 13.0* 6.9 3.7  HGB 12.3 12.0 10.7*  HCT 38.2 38.1 34.6*  MCV 87.0 89.9 87.4  PLT 205 165 142*   Basic Metabolic Panel:  Recent Labs Lab 10/26/16 1216 10/31/16 1451 11/01/16 0552  NA 137 138 139  K 3.5 4.1 4.4    CL 102 104 109  CO2 26 21* 24  GLUCOSE 151* 123* 90  BUN 11 19 25*  CREATININE 0.69 0.86 0.61  CALCIUM 9.1 9.0 8.2*   GFR: Estimated Creatinine Clearance: 44.6 mL/min (by C-G formula based on SCr of 0.61 mg/dL). Liver Function Tests:  Recent Labs Lab 10/26/16 1216 10/31/16 1451 11/01/16 0552  AST 37 22 20  ALT 21 11* 10*  ALKPHOS 99 89 71  BILITOT 0.6 0.6 0.5  PROT 7.7 7.3 6.3*  ALBUMIN 3.5 2.9* 2.3*   No results for input(s): LIPASE, AMYLASE in the last 168 hours. No results for input(s): AMMONIA in the last 168 hours. Coagulation Profile: No results for input(s): INR, PROTIME in the last 168 hours. Cardiac Enzymes: No results for input(s): CKTOTAL, CKMB, CKMBINDEX, TROPONINI in the last 168 hours. BNP (last 3 results) No results for input(s): PROBNP in the last 8760 hours. HbA1C: No results for input(s): HGBA1C in the last 72 hours. CBG:  Recent Labs Lab 10/31/16 1343 10/31/16 2054 11/01/16 0713 11/01/16 1110  GLUCAP 114* 126* 84 81   Lipid Profile: No results for input(s): CHOL, HDL, LDLCALC, TRIG, CHOLHDL, LDLDIRECT in the last 72 hours. Thyroid Function Tests: No results for input(s): TSH, T4TOTAL, FREET4, T3FREE, THYROIDAB in the last 72 hours. Anemia Panel: No results for input(s): VITAMINB12, FOLATE, FERRITIN, TIBC, IRON, RETICCTPCT in the last 72 hours. Sepsis Labs: No results for input(s): PROCALCITON, LATICACIDVEN in the last 168 hours.  Recent Results (from the past 240 hour(s))  MRSA PCR Screening     Status: None   Collection Time: 11/01/16 10:02 AM  Result Value Ref Range Status   MRSA by PCR NEGATIVE NEGATIVE Final    Comment:        The GeneXpert MRSA Assay (FDA approved for NASAL specimens only), is one component of a comprehensive MRSA colonization surveillance program. It is not intended to diagnose MRSA infection nor to guide or monitor treatment for MRSA infections.          Radiology Studies: Dg Chest 2 View  Result  Date: 10/31/2016 CLINICAL DATA:  Altered mental status EXAM: CHEST  2 VIEW COMPARISON:  Chest radiograph 10/26/2016 FINDINGS: Aeration of the lung bases is improved compared to the prior study. There persistent medial left basilar opacities with no new evidence of consolidation. Linear opacities along the right min major or fissure may indicate a small amount of pleural fluid. No pneumothorax or sizable pleural effusion. Unchanged mid thoracic compression deformity. IMPRESSION: 1. Unchanged medial left basilar opacities, which could represent aspiration, pneumonia or atelectasis. 2. Small amount of right fissural pleural fluid. Electronically Signed   By: Deatra RobinsonKevin  Herman M.D.   On: 10/31/2016 14:25   Ct Head Wo Contrast  Result Date: 10/31/2016 CLINICAL DATA:  Altered mental status, dementia EXAM: CT HEAD WITHOUT CONTRAST TECHNIQUE: Contiguous axial images were obtained from the base of the skull through the vertex without intravenous contrast. COMPARISON:  10/20/2016 FINDINGS: Brain: No evidence of acute infarction, hemorrhage, hydrocephalus, extra-axial collection or mass lesion/mass  effect. Vascular: Intracranial atherosclerosis. Skull: Normal. Negative for fracture or focal lesion. Sinuses/Orbits: The visualized paranasal sinuses are essentially clear. The mastoid air cells are unopacified. Other: Global cortical atrophy.  Secondary ventricular prominence. Subcortical white matter and periventricular small vessel ischemic changes. IMPRESSION: No evidence of acute intracranial abnormality. Atrophy with small vessel ischemic changes. Electronically Signed   By: Charline Bills M.D.   On: 10/31/2016 14:36   Dg Foot 2 Views Left  Result Date: 11/01/2016 CLINICAL DATA:  Soft tissue edema EXAM: LEFT FOOT - 2 VIEW COMPARISON:  October 05, 2010 FINDINGS: Frontal and lateral views were obtained. Bones are osteoporotic. There are flexion deformities of the second, third, fourth, and fifth MTP joints. There is  narrowing of all MTP, PIP, and DIP joints. No acute fracture or dislocation. There is osteoarthritic change throughout the dorsal midfoot and ankle joint regions. There are small posterior and inferior calcaneal spurs. There is soft tissue swelling without soft tissue air or evidence of soft tissue abscess. IMPRESSION: Soft tissue edema without radiographically demonstrable abscess or soft tissue air. Multilevel arthropathy. No acute fracture or dislocation. No bony destruction. Bones appear osteoporotic. Electronically Signed   By: Bretta Bang III M.D.   On: 11/01/2016 09:47        Scheduled Meds: . ALPRAZolam  1 mg Oral QHS  . divalproex  250 mg Oral Q12H  . donepezil  10 mg Oral QHS  . enoxaparin (LOVENOX) injection  30 mg Subcutaneous Q24H  . ferrous sulfate  325 mg Oral Q breakfast  . linaclotide  290 mcg Oral QAC breakfast  . piperacillin-tazobactam (ZOSYN)  IV  3.375 g Intravenous Q8H  . polyethylene glycol  17 g Oral BID   Continuous Infusions: . sodium chloride 100 mL/hr at 11/01/16 0544     LOS: 1 day    Angelee Bahr Jaynie Collins, MD [facility] Pager [phone]  If 7PM-7AM, please contact night-coverage [email or web address] Password [email or web address] 11/01/2016, 1:40 PM

## 2016-11-01 NOTE — Evaluation (Signed)
Clinical/Bedside Swallow Evaluation Patient Details  Name: Ashlee BradfordMildred F Barron MRN: [id number] Date of Birth: Oct 28, 1928  Today's Date: 11/01/2016 Time: SLP Start Time (ACUTE ONLY): 1745 SLP Stop Time (ACUTE ONLY): 1820 SLP Time Calculation (min) (ACUTE ONLY): 35 min  Past Medical History:  Past Medical History:  Diagnosis Date  . Constipation   . Dementia   . Diabetes mellitus without complication (HCC)   . Hypertension    Past Surgical History:  Past Surgical History:  Procedure Laterality Date  . ABDOMINAL HYSTERECTOMY    . APPENDECTOMY    . CHOLECYSTECTOMY     HPI:  Ashlee AlphaMildred F Hintonis a 82 y.o.femalewith medical history significant of constipation, dementia, type 2 diabetes, hypertension [facility]. Per notes, the patient was talking this morning. She took enough from 9 to 11:30, then her nephew woke the patient up noticing that she was nonverbal and altered. Apparently, per [facility] staff, this happens sometimes with the patient due to illness or stress. I spoke to her daughter Ashlee Barron, who stated that the patient until about 2 weeks ago was more talkative and walking. Since then, she has been using mostly a wheelchair to move around. She last saw her mother on Friday and stated that she was able to converse with her. Her CBC was normal with a WBC of 8.7 and hemoglobin of 12.0 g/dL. BUN was 21and glucose was 123 mg/dL. Albumin level was 2.9 g/dL. Her urinalysis showed positive nitrites, small leukocyte esterase, proteinuria, many bacteria,WBC from 6-30 per HPF. Her chest radiograph showed medial left basilar opacities suspicious for aspiration, pneumonia or atelectasis. There was a small amount of right fissural pleural fluid. CT scan of the head showed atrophy with small vessel ischemic changes of the brain, but no evidence of acute intracranial abnormality. MD requested BSE due to suspicion of aspiration PNA.   Assessment / Plan /  Recommendation Clinical Impression  Pt seen at bedside for clinical swallow evaluation. Pt with advanced dementia, however daughter reports that up until several weeks ago, Pt was able to voice some basic wants/needs and feed herself. She [facility] and began showing signs of distress so was brought to the hospital. [facility] and did not seem to be getting better and [facility] Pt received leaning to the left in bed (also note chest x-ray shows medial left basilar opacities). Pt unable to follow commands due to mental status, but does respond appropriately to spoon and straw to lips (shows oral readiness). Pt consumed ~120 cc thin water via straw sips without overt signs of aspiration/penetration. Pt required verbal and tactile cues for accepting puree, but once she did, she tolerated well. SLP proceeded with small pieces of graham cracker, which pt masticated, but also demonstrates decreased attention to task and talking during po intake. Pt with delayed congested cough after trials noted while SLP talking with Pt's daughter. Recommend D2/chopped with thin liquids with 100% feeder assist. Pt is at risk for aspiration due to decreased cognitive status. SLP will follow.    Aspiration Risk  Mild aspiration risk    Diet Recommendation Dysphagia 2 (Fine chop);Thin liquid   Liquid Administration via: Straw;Cup Medication Administration: Whole meds with puree (or crushed with puree) Supervision: Staff to assist with self feeding;Full supervision/cueing for compensatory strategies Compensations: Minimize environmental distractions;Slow rate;Small sips/bites Postural Changes: Seated upright at 90 degrees;Remain upright for at least 30  minutes after po intake    Other  Recommendations Oral Care Recommendations: Oral care BID;Staff/trained caregiver to provide oral care Other Recommendations: Clarify dietary restrictions    Follow up Recommendations 24 hour supervision/assistance      Frequency and Duration min 2x/week  1 week       Prognosis Prognosis for Safe Diet Advancement: Fair Barriers to Reach Goals: Cognitive deficits      Swallow Study   General Date of Onset: 10/31/16 HPI: Ashlee AlphaMildred F Hintonis a 82 y.o.femalewith medical history significant of constipation, dementia, type 2 diabetes, hypertension [facility]. Per notes, the patient was talking this morning. She took enough from 9 to 11:30, then her nephew woke the patient up noticing that she was nonverbal and altered. Apparently, per [facility] staff, this happens sometimes with the patient due to illness or stress. I spoke to her daughter Ashlee Barron, who stated that the patient until about 2 weeks ago was more talkative and walking. Since then, she has been using mostly a wheelchair to move around. She last saw her mother on Friday and stated that she was able to converse with her. Her CBC was normal with a WBC of 8.7 and hemoglobin of 12.0 g/dL. BUN was 21and glucose was 123 mg/dL. Albumin level was 2.9 g/dL. Her urinalysis showed positive nitrites, small leukocyte esterase, proteinuria, many bacteria,WBC from 6-30 per HPF. Her chest radiograph showed medial left basilar opacities suspicious for aspiration, pneumonia or atelectasis. There was a small amount of right fissural pleural fluid. CT scan of the head showed atrophy with small vessel ischemic changes of the brain, but no evidence of acute intracranial abnormality. MD requested BSE due to suspicion of aspiration PNA. Type of Study: Bedside Swallow Evaluation Previous Swallow Assessment: None on record Diet Prior to this Study:  (full liquids) Temperature Spikes Noted: No Respiratory Status: Nasal cannula History of Recent Intubation: No Behavior/Cognition: Alert;Cooperative;Doesn't follow directions;Confused Oral Cavity Assessment: Within Functional  Limits Oral Care Completed by SLP: Yes Oral Cavity - Dentition:  (missing upper dentition, own lower dentition) Vision: Impaired for self-feeding Self-Feeding Abilities: Total assist Patient Positioning: Upright in bed Baseline Vocal Quality: Normal Volitional Cough: Cognitively unable to elicit Volitional Swallow: Unable to elicit    Oral/Motor/Sensory Function Overall Oral Motor/Sensory Function: Mild impairment (Pt with decreased mentation)   Ice Chips Ice chips: Impaired Presentation: Spoon Oral Phase Impairments: Reduced lingual movement/coordination Oral Phase Functional Implications: Oral holding Pharyngeal Phase Impairments: Suspected delayed Swallow   Thin Liquid Thin Liquid: Within functional limits Presentation: Cup;Straw;Spoon    Nectar Thick Nectar Thick Liquid: Not tested   Honey Thick Honey Thick Liquid: Not tested   Puree Puree: Within functional limits Presentation: Spoon   Solid     Solid: Impaired Presentation:  (broke off small pieces and placed on lips) Oral Phase Impairments: Poor awareness of bolus (talking while eating) Oral Phase Functional Implications: Prolonged oral transit       Thank you,  Havery MorosDabney Porter, CCC-SLP [phone]  PORTER,DABNEY 11/01/2016,7:07 PM

## 2016-11-01 NOTE — Progress Notes (Addendum)
Pharmacy Antibiotic Note  Ashlee BradfordMildred F Barron is a 82 y.o. female admitted on 10/31/2016 with sepsis.  Pharmacy has been consulted for Zosyn dosing.  Plan: Zosyn 3.375gm IV q8h, EID Monitor labs, progress, c/s    Temp (24hrs), Avg:99.1 F (37.3 C), Min:98.5 F (36.9 C), Max:99.9 F (37.7 C)   Recent Labs Lab 10/26/16 1216 10/31/16 1451 11/01/16 0552  WBC 14.7* 8.7 5.8  CREATININE 0.69 0.86 0.61    Estimated Creatinine Clearance: 44.6 mL/min (by C-G formula based on SCr of 0.61 mg/dL).    No Known Allergies  Antimicrobials this admission: Levaquin 12/11 >> 12/12 Rocephin 12/11 >>12/12 Zosyn 12/12 >>  Dose adjustments this admission:  Microbiology results:  BCx: pending  UCx: pending   Sputum:    MRSA PCR:   Thank you for allowing pharmacy to be a part of this patient's care.  Valrie HartScott Manmeet Arzola, PharmD Clinical Pharmacist Pager:  [phone] 11/01/2016

## 2016-11-01 NOTE — Clinical Social Work Note (Signed)
Please see assessment from admission earlier this month. No changes reported. [facility]    Clinical Social Work Assessment  Patient Details  Name: Ashlee Barron MRN: [id number] Date of Birth: 1928-06-03  Date of referral:  10/20/16               Reason for consult:  Discharge Planning                           Permission sought to share information with:  Oceanographer[facility]ility Contact Representative Permission granted to share information::  Yes, Verbal Permission Granted             Name::                   Agency[facility]             Relationship::  facility             Contact Information:     Housing/Transportation Living arrangements for the past 2 months:  [facility] Source of Information:  Adult Children, Facility Patient Interpreter Needed:  None Criminal Activity/Legal Involvement Pertinent to Current Situation/Hospitalization:  No - Comment as needed Significant Relationships:  Adult Children Lives with:  Facility Resident Do you feel safe going back to the place where you live?  Yes Need for family participation in patient care:  Yes (Comment)  Care giving concerns:  None reported. Pt is resident at ALF.    Social Worker assessment / plan:  CSW spoke with pt's daughter, Ashlee Barron as pt was sleeping and is oriented to self only. [facility]. Ashlee Barron is involved and supportive. Per Ashlee Barron at facility, pt was walking with a walker two weeks ago and now uses a wheelchair. They noted increased AMS and brought pt to ED for evaluation. Pt has dementia. CSW noted hospice mentioned in progress note. Discussed with Ashlee Barron who would like to wait for more information before making decision. Ashlee Barron indicates pt is okay for return and agreeable to either home health or hospice if needed.   Employment status:  Retired Database administratornsurance information:  Managed Medicare PT Recommendations:  Not assessed at this  time Information / Referral to community resources:  Other (Comment Required) ([facility])  Patient/Family's Response to care:  Pt's daughter [facility].   Patient/Family's Understanding of and Emotional Response to Diagnosis, Current Treatment, and Prognosis:  Pt's daughter spoke with MD this morning and shared she was surprised to hear hospice mentioned. She would like to see results of UA before making a decision on this and would want to discuss with MD further.   Emotional Assessment Appearance:  Appears stated age Attitude/Demeanor/Rapport:  Unable to Assess Affect (typically observed):  Unable to Assess Orientation:  Oriented to Self Alcohol / Substance use:  Not Applicable Psych involvement (Current and /or in the community):  No (Comment)  Discharge Needs  Concerns to be addressed:  Discharge Planning Concerns Readmission within the last 30 days:  No Current discharge risk:  Cognitively Impaired Barriers to Discharge:  Continued Medical Work up   [name], Ashlee Pipkins [place], LCSW 10/20/2016, 8:32 AM [phone]

## 2016-11-02 ENCOUNTER — Encounter (HOSPITAL_COMMUNITY): Payer: Self-pay | Admitting: Primary Care

## 2016-11-02 ENCOUNTER — Inpatient Hospital Stay (HOSPITAL_COMMUNITY): Payer: Medicare Other

## 2016-11-02 DIAGNOSIS — Z515 Encounter for palliative care: Secondary | ICD-10-CM

## 2016-11-02 DIAGNOSIS — F0391 Unspecified dementia with behavioral disturbance: Secondary | ICD-10-CM

## 2016-11-02 DIAGNOSIS — Z7189 Other specified counseling: Secondary | ICD-10-CM

## 2016-11-02 LAB — CBC WITH DIFFERENTIAL/PLATELET
BASOS ABS: 0 10*3/uL (ref 0.0–0.1)
BASOS PCT: 0 %
EOS ABS: 0.1 10*3/uL (ref 0.0–0.7)
Eosinophils Relative: 1 %
HEMATOCRIT: 28.7 % — AB (ref 36.0–46.0)
HEMOGLOBIN: 9.2 g/dL — AB (ref 12.0–15.0)
Lymphocytes Relative: 25 %
Lymphs Abs: 1.8 10*3/uL (ref 0.7–4.0)
MCH: 27.9 pg (ref 26.0–34.0)
MCHC: 32.1 g/dL (ref 30.0–36.0)
MCV: 87 fL (ref 78.0–100.0)
Monocytes Absolute: 0.8 10*3/uL (ref 0.1–1.0)
Monocytes Relative: 11 %
NEUTROS ABS: 4.5 10*3/uL (ref 1.7–7.7)
NEUTROS PCT: 63 %
Platelets: 152 10*3/uL (ref 150–400)
RBC: 3.3 MIL/uL — AB (ref 3.87–5.11)
RDW: 14.4 % (ref 11.5–15.5)
WBC: 7.1 10*3/uL (ref 4.0–10.5)

## 2016-11-02 LAB — GLUCOSE, CAPILLARY
GLUCOSE-CAPILLARY: 115 mg/dL — AB (ref 65–99)
GLUCOSE-CAPILLARY: 144 mg/dL — AB (ref 65–99)
GLUCOSE-CAPILLARY: 130 mg/dL — AB (ref 65–99)
Glucose-Capillary: 148 mg/dL — ABNORMAL HIGH (ref 65–99)

## 2016-11-02 NOTE — Progress Notes (Signed)
PROGRESS NOTE    Ashlee BradfordMildred F Barron  VHQ:[id number] DOB: 02-15-1928 DOA: 10/31/2016 PCP: No PCP Per Patient   Brief Narrative: 82 y.o. female with medical history significant of constipation, dementia, type 2 diabetes, hypertension who was [facility] due to altered mental status.  Assessment & Plan:  #  Altered mental status likely acute encephalopathy in the setting of UTI/PNA in underlying dementia: As per record, at baseline patient can walk and talking but recently she is becoming more confused and weak. -Currently on  IV Zosyn. Follow cultures, urine growing Escherichia coli. -Head CT showed atrophy with small vessel ischemic changes. -Continue supportive care.  #Acute cystitis without hematuria: Patient is unable to tell me if she has urinary symptoms. Antibiotics on IV Zosyn now. Follow up culture results.  #Left lower lobe opacity, probably atelectasis versus aspiration pneumonia: Antibiotics switched to IV Zosyn. Patient is not hypoxic. Maintaining oxygen saturation in room air. Repeat chest x-ray.  # Type 2 diabetes mellitus (HCC): Monitor blood sugar level. Last A1c was 6 on November 30.  #Possible dysphagia, oropharyngeal: As per RN patient had episodes of choking this morning. Swallow evaluation requested.   # Dementia, exact type unknown: Likely vascular versus Alzheimer dementia. Patient was calm and comfortable this morning. Continue supportive care. Patient is DO NOT RESUSCITATE. I requested palliative care consult to address goals of care and possible hospice. On Aricept.  #  Edema of left foot: X-ray with soft tissue swelling. No sign of fracture. Elevate the foot and supportive care.  # Normocytic anemia: Drop in hemoglobin today likely due to dilutional. Repeat CBC in the morning. No sign of bleeding.  DVT prophylaxis: Lovenox subcutaneous Code Status: DO NOT RESUSCITATE Family Communication: No family present at bedside Disposition Plan: Likely  discharge to SNF in 1-2 days   Consultants:   None  Discussed with the case manager and social worker regarding discharge planning.  Procedures: None Antimicrobials: Discontinue Levaquin and ceftriaxone and he started Zosyn today.  Subjective: Patient was seen and examined at bedside. Patient was oriented to herself only. Denied headache, dizziness, nausea vomiting chest pain. Review of systems not reliable because of her altered mental status.   Objective: Vitals:   11/01/16 2131 11/01/16 2241 11/02/16 0652 11/02/16 1300  BP: (!) 154/59  (!) 150/70 (!) 145/49  Pulse: 92  89 86  Resp: 20  18 18   Temp: 98 F (36.7 C)  99.2 F (37.3 C) 98 F (36.7 C)  TempSrc: Oral  Oral Oral  SpO2: 100% 94% 95% 100%    Intake/Output Summary (Last 24 hours) at 11/02/16 1521 Last data filed at 11/02/16 [id number]  Gross per 24 hour  Intake             1960 ml  Output              850 ml  Net             1110 ml   There were no vitals filed for this visit.  Examination:  General exam: Confused elderly female lying in bed looks comfortable.   Respiratory system: Clear to auscultation. Respiratory effort normal. No wheezing or crackle Cardiovascular system: S1 & S2 heard, RRR.  No pedal edema. Gastrointestinal system: Abdomen is nondistended, soft and nontender. Normal bowel sounds heard. Central nervous system: Alert awake and oriented to herself only. Skin: No rashes, lesions or ulcers Psychiatry: Judgement and insight appear impaired. Patient has dementia.    Data Reviewed: I have  personally reviewed following labs and imaging studies  CBC:  Recent Labs Lab 10/31/16 1451 11/01/16 0552 11/02/16 0618  WBC 8.7 5.8 7.1  NEUTROABS 6.9 3.7 4.5  HGB 12.0 10.7* 9.2*  HCT 38.1 34.6* 28.7*  MCV 89.9 87.4 87.0  PLT 165 142* 152   Basic Metabolic Panel:  Recent Labs Lab 10/31/16 1451 11/01/16 0552  NA 138 139  K 4.1 4.4  CL 104 109  CO2 21* 24  GLUCOSE 123* 90  BUN 19 25*    CREATININE 0.86 0.61  CALCIUM 9.0 8.2*   GFR: Estimated Creatinine Clearance: 44.6 mL/min (by C-G formula based on SCr of 0.61 mg/dL). Liver Function Tests:  Recent Labs Lab 10/31/16 1451 11/01/16 0552  AST 22 20  ALT 11* 10*  ALKPHOS 89 71  BILITOT 0.6 0.5  PROT 7.3 6.3*  ALBUMIN 2.9* 2.3*   No results for input(s): LIPASE, AMYLASE in the last 168 hours. No results for input(s): AMMONIA in the last 168 hours. Coagulation Profile: No results for input(s): INR, PROTIME in the last 168 hours. Cardiac Enzymes: No results for input(s): CKTOTAL, CKMB, CKMBINDEX, TROPONINI in the last 168 hours. BNP (last 3 results) No results for input(s): PROBNP in the last 8760 hours. HbA1C: No results for input(s): HGBA1C in the last 72 hours. CBG:  Recent Labs Lab 11/01/16 1110 11/01/16 1619 11/01/16 2138 11/02/16 0731 11/02/16 1210  GLUCAP 81 139* 234* 115* 144*   Lipid Profile: No results for input(s): CHOL, HDL, LDLCALC, TRIG, CHOLHDL, LDLDIRECT in the last 72 hours. Thyroid Function Tests: No results for input(s): TSH, T4TOTAL, FREET4, T3FREE, THYROIDAB in the last 72 hours. Anemia Panel: No results for input(s): VITAMINB12, FOLATE, FERRITIN, TIBC, IRON, RETICCTPCT in the last 72 hours. Sepsis Labs: No results for input(s): PROCALCITON, LATICACIDVEN in the last 168 hours.  Recent Results (from the past 240 hour(s))  Urine culture     Status: Abnormal (Preliminary result)   Collection Time: 10/31/16  1:47 PM  Result Value Ref Range Status   Specimen Description URINE, RANDOM  Final   Special Requests NONE  Final   Culture >=100,000 COLONIES/mL ESCHERICHIA COLI (A)  Final   Report Status PENDING  Incomplete  MRSA PCR Screening     Status: None   Collection Time: 11/01/16 10:02 AM  Result Value Ref Range Status   MRSA by PCR NEGATIVE NEGATIVE Final    Comment:        The GeneXpert MRSA Assay (FDA approved for NASAL specimens only), is one component of a comprehensive  MRSA colonization surveillance program. It is not intended to diagnose MRSA infection nor to guide or monitor treatment for MRSA infections.          Radiology Studies: Dg Foot 2 Views Left  Result Date: 11/01/2016 CLINICAL DATA:  Soft tissue edema EXAM: LEFT FOOT - 2 VIEW COMPARISON:  October 05, 2010 FINDINGS: Frontal and lateral views were obtained. Bones are osteoporotic. There are flexion deformities of the second, third, fourth, and fifth MTP joints. There is narrowing of all MTP, PIP, and DIP joints. No acute fracture or dislocation. There is osteoarthritic change throughout the dorsal midfoot and ankle joint regions. There are small posterior and inferior calcaneal spurs. There is soft tissue swelling without soft tissue air or evidence of soft tissue abscess. IMPRESSION: Soft tissue edema without radiographically demonstrable abscess or soft tissue air. Multilevel arthropathy. No acute fracture or dislocation. No bony destruction. Bones appear osteoporotic. Electronically Signed   By: Bretta Bang  III M.D.   On: 11/01/2016 09:47        Scheduled Meds: . ALPRAZolam  1 mg Oral QHS  . divalproex  250 mg Oral Q12H  . donepezil  10 mg Oral QHS  . enoxaparin (LOVENOX) injection  30 mg Subcutaneous Q24H  . ferrous sulfate  325 mg Oral Q breakfast  . linaclotide  290 mcg Oral QAC breakfast  . piperacillin-tazobactam (ZOSYN)  IV  3.375 g Intravenous Q8H  . polyethylene glycol  17 g Oral BID   Continuous Infusions: . sodium chloride 100 mL/hr at 11/02/16 0930     LOS: 2 days    Leroy SeaSINGH,PRASHANT K, MD [facility] Pager [phone]  If 7PM-7AM, please contact night-coverage [email or web address] Password [email or web address] 11/02/2016, 3:21 PM

## 2016-11-02 NOTE — Consult Note (Signed)
Consultation Note Date: 11/02/2016   Patient Name: Ashlee BradfordMildred F Barron  DOB: 1928/01/11  MRN: [id number]  Age / Sex: 82 y.o., female  PCP: No Pcp Per Patient Referring Physician: Leroy SeaPrashant K Singh, MD  Reason for Consultation: Establishing goals of care  HPI/Patient Profile: 82 y.o. female  with past medical history of Dementia, diabetes, hypertension, ALF residents admitted on 10/31/2016 with sepsis secondary to UTI, aspiration pneumonia versus atelectasis, and dementia.   Clinical Assessment and Goals of Care: Ashlee Barron is resting quietly in bed. She does not respond to me until I call her name in touch her arm. She is demented, and can only tell me her name. She is unable to make a sentence. She does make but not keep eye contact. She denies pain. No family at bedside. Call to daughter, Arlyss QueenMyra Reed. No answer at [phone], unable to leave voicemail, no answer at [phone], no message left. Daughter [name] Azucena KubaReid has suggested in the past that they may be open to the benefits of hospice. [facility] is open to the benefits of hospice services. Discussion regarding the benefits of hospice would benefit.   NEXT OF KIN   SUMMARY OF RECOMMENDATIONS   continue to treat the treatable at this point, not sure Vidal Schwalbeeri measures such as CPR or intubation,  encourage daughter to accept hospice in ALF.  Code Status/Advance Care Planning:  DNR  Symptom Management:   per hospitalist  Palliative Prophylaxis:   Aspiration, Oral Care and Turn Reposition  Additional Recommendations (Limitations, Scope, Preferences):  No extraordinary measures such as CPR or intubation  Psycho-social/Spiritual:   Desire for further Chaplaincy support:no  Additional Recommendations: Education on Hospice  Prognosis:   < 6 months, would not be surprising based on advanced dementia, 3 hospitalizations and 6 months, functional decline.    Discharge Planning: Goal, at this point, is to return [facility]. Patient would benefit from hospice services in the ALF      Primary Diagnoses: Present on Admission: . Sepsis secondary to UTI (HCC) . Altered mental status . Dementia with behavioral disturbance . Essential hypertension . Aspiration pneumonia (HCC) . Abnormal EKG . Hypoalbuminemia . Edema of left foot   I have reviewed the medical record, interviewed the patient and family, and examined the patient. The following aspects are pertinent.  Past Medical History:  Diagnosis Date  . Constipation   . Dementia   . Diabetes mellitus without complication (HCC)   . Hypertension    Social History   Social History  . Marital status: Widowed    Spouse name: N/A  . Number of children: N/A  . Years of education: N/A   Social History Main Topics  . Smoking status: Never Smoker  . Smokeless tobacco: Never Used  . Alcohol use No  . Drug use: No  . Sexual activity: Not Asked   Other Topics Concern  . None   Social History Narrative  . None   Family History  Problem Relation Age of Onset  . Family history unknown:  Yes   Scheduled Meds: . ALPRAZolam  1 mg Oral QHS  . divalproex  250 mg Oral Q12H  . donepezil  10 mg Oral QHS  . enoxaparin (LOVENOX) injection  30 mg Subcutaneous Q24H  . ferrous sulfate  325 mg Oral Q breakfast  . linaclotide  290 mcg Oral QAC breakfast  . piperacillin-tazobactam (ZOSYN)  IV  3.375 g Intravenous Q8H  . polyethylene glycol  17 g Oral BID   Continuous Infusions: . sodium chloride 100 mL/hr at 11/02/16 0930   PRN Meds:.acetaminophen, ALPRAZolam, bisacodyl, haloperidol, traMADol Medications Prior to Admission:  Prior to Admission medications   Medication Sig Start Date End Date Taking? Authorizing Provider  acetaminophen (TYLENOL) 500 MG tablet Take 500 mg by mouth 3 (three) times daily as needed for mild pain.    Yes Historical Provider, MD  ALPRAZolam (XANAX) 0.25 MG  tablet Take 0.25 mg by mouth daily.    Yes Historical Provider, MD  ALPRAZolam Prudy Feeler) 1 MG tablet Take 1 mg by mouth at bedtime.   Yes Historical Provider, MD  bisacodyl (DULCOLAX) 10 MG suppository Place 1 suppository (10 mg total) rectally daily as needed for moderate constipation. 09/11/16  Yes Pearson Grippe, MD  divalproex (DEPAKOTE) 250 MG DR tablet Take 250 mg by mouth 2 (two) times daily.   Yes Historical Provider, MD  donepezil (ARICEPT) 10 MG tablet Take 10 mg by mouth at bedtime.   Yes Historical Provider, MD  ferrous sulfate 325 (65 FE) MG tablet Take 325 mg by mouth daily with breakfast.   Yes Historical Provider, MD  haloperidol (HALDOL) 1 MG tablet Take 1 tablet (1 mg total) by mouth daily as needed for agitation. 09/11/16  Yes Pearson Grippe, MD  linaclotide [facility]) 290 MCG CAPS capsule Take 1 capsule (290 mcg total) by mouth daily before breakfast. 09/11/16  Yes Pearson Grippe, MD  polyethylene glycol (MIRALAX / GLYCOLAX) packet Take 17 g by mouth 2 (two) times daily. 09/11/16  Yes Pearson Grippe, MD  traMADol (ULTRAM) 50 MG tablet Take 50 mg by mouth every 12 (twelve) hours as needed for moderate pain or severe pain.    Yes Historical Provider, MD  cephALEXin (KEFLEX) 500 MG capsule Take 1 capsule (500 mg total) by mouth 2 (two) times daily. Patient not taking: Reported on 10/31/2016 10/21/16   Pearson Grippe, MD  levofloxacin (LEVAQUIN) 750 MG tablet Take 1 tablet (750 mg total) by mouth daily. X 6 days Patient not taking: Reported on 10/31/2016 10/27/16   Marily Memos, MD   No Known Allergies Review of Systems  Unable to perform ROS: Dementia    Physical Exam  Constitutional: No distress.  HENT:  Head: Normocephalic and atraumatic.  Cardiovascular: Normal rate and regular rhythm.   Pulmonary/Chest: Effort normal. No respiratory distress.  Abdominal: Soft. She exhibits no distension. There is no guarding.  Neurological:  Sleepy, but opens her eyes when I call her name. Demented, Able to tell  me her name only  Skin: Skin is warm and dry.  Nursing note and vitals reviewed.   Vital Signs: BP (!) 150/70 (BP Location: Right Arm)   Pulse 89   Temp 99.2 F (37.3 C) (Oral)   Resp 18   SpO2 95%  Pain Assessment: Faces POSS *See Group Information*: 1-Acceptable,Awake and alert Pain Score: 0-No pain   SpO2: SpO2: 95 % O2 Device:SpO2: 95 % O2 Flow Rate: .   IO: Intake/output summary:  Intake/Output Summary (Last 24 hours) at 11/02/16 1239 Last data filed  at 11/02/16 [id number]  Gross per 24 hour  Intake             2360 ml  Output              850 ml  Net             1510 ml    LBM: Last BM Date: 10/31/16 Baseline Weight:   Most recent weight:       Palliative Assessment/Data:   Flowsheet Rows   Flowsheet Row Most Recent Value  Intake Tab  Referral Department  Hospitalist  Unit at Time of Referral  Med/Surg Unit  Palliative Care Primary Diagnosis  Sepsis/Infectious Disease  Date Notified  11/01/16  Palliative Care Type  New Palliative care  Reason for referral  Clarify Goals of Care  Date of Admission  10/31/16  Date first seen by Palliative Care  11/02/16  # of days Palliative referral response time  1 Day(s)  # of days IP prior to Palliative referral  1  Clinical Assessment  Palliative Performance Scale Score  20%  Pain Max last 24 hours  Not able to report  Pain Min Last 24 hours  Not able to report  Dyspnea Max Last 24 Hours  Not able to report  Dyspnea Min Last 24 hours  Not able to report  Psychosocial & Spiritual Assessment  Palliative Care Outcomes  Patient/Family meeting held?  No [Patient demented, unable to reach family at this time]  Palliative Care Outcomes  Provided psychosocial or spiritual support  Patient/Family wishes: Interventions discontinued/not started   Mechanical Ventilation      Time In: 0840 Time Out: 0915 Time Total: 35 minutes Greater than 50%  of this time was spent counseling and coordinating care related to the above  assessment and plan.  Signed by: Katheran Aweasha A Vanessia Bokhari, NP   Please contact Palliative Medicine Team phone at [phone] for questions and concerns.  For individual provider: See Loretha StaplerAmion

## 2016-11-02 NOTE — Care Management Note (Addendum)
Case Management Note  Patient Details  Name: Ashlee BradfordMildred F Barron MRN: [id number] Date of Birth: 05-22-1928  Subjective/Objective:                  [facility]. She has dementia and is WC bound at baseline. [facility]. Alroy BailiffLinda Lothian[facility] aware [facility]. Palliative NP has seen family and is discussing hospice in ALF.   Action/Plan: Plan for return to ALF with resumption of HH services VS hospice. CSW is aware of plan for return to facility. CM will cont to follow.   Expected Discharge Date:     11/04/2016             Expected Discharge Plan[facility]  In-House Referral:  Clinical Social Work  Discharge planning Services  CM Consult  Post Acute Care Choice:    Choice offered to:  Patient  HH Arranged:  RN, Speech Therapy HH Agency:  [facility]  Status of Service:  In process, will continue to follow  Malcolm MetroChildress, [place] Demske, RN 11/02/2016, 3:09 PM

## 2016-11-03 ENCOUNTER — Inpatient Hospital Stay (HOSPITAL_COMMUNITY): Payer: Medicare Other

## 2016-11-03 DIAGNOSIS — N390 Urinary tract infection, site not specified: Secondary | ICD-10-CM

## 2016-11-03 DIAGNOSIS — A419 Sepsis, unspecified organism: Principal | ICD-10-CM

## 2016-11-03 LAB — GLUCOSE, CAPILLARY
GLUCOSE-CAPILLARY: 125 mg/dL — AB (ref 65–99)
Glucose-Capillary: 171 mg/dL — ABNORMAL HIGH (ref 65–99)
Glucose-Capillary: 139 mg/dL — ABNORMAL HIGH (ref 65–99)
Glucose-Capillary: 120 mg/dL — ABNORMAL HIGH (ref 65–99)

## 2016-11-03 LAB — URINE CULTURE: Culture: >=100000 — AB

## 2016-11-03 LAB — BRAIN NATRIURETIC PEPTIDE: B Natriuretic Peptide: 106 pg/mL — ABNORMAL HIGH (ref 0.0–100.0)

## 2016-11-03 MED ORDER — BISACODYL 10 MG RE SUPP
10.0000 mg | Freq: Every day | RECTAL | Status: DC
  Administered 2016-11-03: 10 mg via RECTAL
  Filled 2016-11-03: qty 1

## 2016-11-03 MED ORDER — FUROSEMIDE 10 MG/ML IJ SOLN
20.0000 mg | Freq: Once | INTRAMUSCULAR | Status: AC
  Administered 2016-11-03: 20 mg via INTRAVENOUS
  Filled 2016-11-03: qty 2

## 2016-11-03 MED ORDER — SODIUM CHLORIDE 0.9 % IV SOLN
INTRAVENOUS | Status: DC
  Administered 2016-11-03: 22:00:00 via INTRAVENOUS

## 2016-11-03 NOTE — Plan of Care (Signed)
Problem: Skin Integrity: Goal: Risk for impaired skin integrity will decrease Outcome: Not Progressing Pt has multiple wounds. See assessment. Stage II documented on buttocks.

## 2016-11-03 NOTE — Progress Notes (Signed)
Pharmacy Antibiotic Note  [name] is a 82 y.o. female admitted on 10/31/2016 with sepsis.  Pharmacy has been consulted for Zosyn dosing.  EColi isolate sensitive to Zosyn.  Plan: Zosyn 3.375gm IV q8h, EID Monitor labs, progress, c/s    Temp (24hrs), Avg:99 F (37.2 C), Min:98 F (36.7 C), Max:101.7 F (38.7 C)   Recent Labs Lab 10/31/16 1451 11/01/16 0552 11/02/16 0618  WBC 8.7 5.8 7.1  CREATININE 0.86 0.61  --     CrCl cannot be calculated (Unknown ideal weight.).    No Known Allergies  Antimicrobials this admission: Levaquin 12/11 >> 12/12 Rocephin 12/11 >>12/12 Zosyn 12/12 >>  Recent Results (from the past 240 hour(s))  Urine culture     Status: Abnormal   Collection Time: 10/31/16  1:47 PM  Result Value Ref Range Status   Specimen Description URINE, RANDOM  Final   Special Requests NONE  Final   Culture (A)  Final    >=100,000 COLONIES/mL ESCHERICHIA COLI Confirmed Extended Spectrum Beta-Lactamase Producer (ESBL) [facility]    Report Status 11/03/2016 FINAL  Final   Organism ID, Bacteria ESCHERICHIA COLI (A)  Final      Susceptibility   Escherichia coli - MIC*    AMPICILLIN >=32 RESISTANT Resistant     CEFAZOLIN >=64 RESISTANT Resistant     CEFTRIAXONE >=64 RESISTANT Resistant     CIPROFLOXACIN >=4 RESISTANT Resistant     GENTAMICIN <=1 SENSITIVE Sensitive     IMIPENEM <=0.25 SENSITIVE Sensitive     NITROFURANTOIN 128 RESISTANT Resistant     TRIMETH/SULFA >=320 RESISTANT Resistant     AMPICILLIN/SULBACTAM >=32 RESISTANT Resistant     PIP/TAZO 8 SENSITIVE Sensitive     Extended ESBL POSITIVE Resistant     * >=100,000 COLONIES/mL ESCHERICHIA COLI  MRSA PCR Screening     Status: None   Collection Time: 11/01/16 10:02 AM  Result Value Ref Range Status   MRSA by PCR NEGATIVE NEGATIVE Final    Comment:        The GeneXpert MRSA Assay (FDA approved for NASAL specimens only), is one component of a comprehensive MRSA  colonization surveillance program. It is not intended to diagnose MRSA infection nor to guide or monitor treatment for MRSA infections.     Thank you for allowing pharmacy to be a part of this patient's care.  Valrie HartScott Maddalyn Lutze, PharmD Clinical Pharmacist Pager:  [phone] 11/03/2016

## 2016-11-03 NOTE — Progress Notes (Signed)
SLP Cancellation Note  Patient Details Name: Ashlee BradfordMildred F Strum MRN: [id number] DOB: [birth year]   Cancelled treatment:       Reason Eval/Treat Not Completed: Other (comment); Pt currently NPO due to suspected bowel obstruction. Pt not see for dysphagia intervention this date. Please see clinical swallow evaluation completed 11/01/2016.  Thank you,  Havery MorosDabney Leor Whyte, CCC-SLP [phone]    Ramanda Paules 11/03/2016, 5:19 PM

## 2016-11-03 NOTE — Progress Notes (Signed)
PROGRESS NOTE    Ashlee Barron  ZOX:[id number] DOB: June 07, 1928 DOA: 10/31/2016 PCP: No PCP Per Patient   Brief Narrative: 82 y.o. female with medical history significant of constipation, dementia, type 2 diabetes, hypertension who was brought from [facility] due to altered mental status.  Subjective - patient in bed, she is pleasantly confused due to dementia, unreliable historian but appears to be in mild distress, she says her abdominal does not feel good but is not able to clarify any more, denies any chest pain or shortness of breath.  Assessment & Plan:  #  Altered mental status likely acute encephalopathy in the setting of UTI/PNA in underlying dementia: As per record, at baseline patient can walk and talking but recently she is becoming more confused and weak. -Currently on  IV Zosyn. Escherichia coli is resistant to all antibiotics except Zosyn which will be continued. She has clinically responded well to it, she is afebrile with normal WBCs. Once bowel function has improved Will give her fosfomycin. -Head CT showed atrophy with small vessel ischemic changes. -Continue supportive care.  #Possible early small bowel obstruction was his ileus on 11/03/2016. Made her nothing by mouth except medications, gentle IV fluids, Dulcolax positive area in monitor, she is not a candidate for surgical intervention.   #Acute cystitis without hematuria: Patient is unable to tell me if she has urinary symptoms. Antibiotics on IV Zosyn now. Follow up culture results.  #Left lower lobe opacity, probably atelectasis versus aspiration pneumonia: Antibiotics switched to IV Zosyn. Patient is not hypoxic. Maintaining oxygen saturation in room air.   # Type 2 diabetes mellitus (HCC): Monitor blood sugar level. Last A1c was 6 on November 30.  #Possible dysphagia, oropharyngeal: As per RN patient had episodes of choking this morning. Swallow evaluation requested.   # Dementia, exact type unknown: Likely  vascular versus Alzheimer dementia. Patient was calm and comfortable this morning. Continue supportive care. Patient is DO NOT RESUSCITATE. I requested palliative care consult to address goals of care and possible hospice. On Aricept.  #  Edema of left foot: X-ray with soft tissue swelling. No sign of fracture. Elevate the foot and supportive care.  # Normocytic anemia: Drop in hemoglobin today likely due to dilutional. Repeat CBC in the morning. No sign of bleeding.   DVT prophylaxis: Lovenox subcutaneous Code Status: DO NOT RESUSCITATE Family Communication: No family present at bedside Disposition Plan: Likely discharge to SNF in 1-2 days   Consultants:   None  Discussed with the case manager and social worker regarding discharge planning.  Procedures: None Antimicrobials: Discontinue Levaquin and ceftriaxone and he started Zosyn today.    Objective: Vitals:   11/02/16 1300 11/02/16 2023 11/03/16 0046 11/03/16 0500  BP: (!) 145/49 (!) 153/51 (!) 149/58 (!) 142/71  Pulse: 86 99 97 91  Resp: 18 18 18    Temp: 98 F (36.7 C) (!) 101.7 F (38.7 C) 98.1 F (36.7 C) 98.1 F (36.7 C)  TempSrc: Oral Oral Oral   SpO2: 100% 100% 96% 100%    Intake/Output Summary (Last 24 hours) at 11/03/16 1010 Last data filed at 11/03/16 8119  Gross per 24 hour  Intake             1510 ml  Output             1800 ml  Net             -290 ml   There were no vitals filed for this visit.  Examination:  General exam: Confused elderly female lying in bed looks comfortable.   Respiratory system: Clear to auscultation. Respiratory effort normal. No wheezing or crackle Cardiovascular system: S1 & S2 heard, RRR.  No pedal edema. Gastrointestinal system: Abdomen is nondistended, soft but ? Mild tenderness. Hypoactive bowel sounds heard. Central nervous system: Alert awake and oriented to herself only. Skin: No rashes, lesions or ulcers Psychiatry: Judgement and insight appear impaired. Patient  has dementia.    Data Reviewed: I have personally reviewed following labs and imaging studies  CBC:  Recent Labs Lab 10/31/16 1451 11/01/16 0552 11/02/16 0618  WBC 8.7 5.8 7.1  NEUTROABS 6.9 3.7 4.5  HGB 12.0 10.7* 9.2*  HCT 38.1 34.6* 28.7*  MCV 89.9 87.4 87.0  PLT 165 142* 152   Basic Metabolic Panel:  Recent Labs Lab 10/31/16 1451 11/01/16 0552  NA 138 139  K 4.1 4.4  CL 104 109  CO2 21* 24  GLUCOSE 123* 90  BUN 19 25*  CREATININE 0.86 0.61  CALCIUM 9.0 8.2*   GFR: CrCl cannot be calculated (Unknown ideal weight.). Liver Function Tests:  Recent Labs Lab 10/31/16 1451 11/01/16 0552  AST 22 20  ALT 11* 10*  ALKPHOS 89 71  BILITOT 0.6 0.5  PROT 7.3 6.3*  ALBUMIN 2.9* 2.3*   No results for input(s): LIPASE, AMYLASE in the last 168 hours. No results for input(s): AMMONIA in the last 168 hours. Coagulation Profile: No results for input(s): INR, PROTIME in the last 168 hours. Cardiac Enzymes: No results for input(s): CKTOTAL, CKMB, CKMBINDEX, TROPONINI in the last 168 hours. BNP (last 3 results) No results for input(s): PROBNP in the last 8760 hours. HbA1C: No results for input(s): HGBA1C in the last 72 hours. CBG:  Recent Labs Lab 11/02/16 0731 11/02/16 1210 11/02/16 1634 11/02/16 2021 11/03/16 0745  GLUCAP 115* 144* 130* 148* 125*   Lipid Profile: No results for input(s): CHOL, HDL, LDLCALC, TRIG, CHOLHDL, LDLDIRECT in the last 72 hours. Thyroid Function Tests: No results for input(s): TSH, T4TOTAL, FREET4, T3FREE, THYROIDAB in the last 72 hours. Anemia Panel: No results for input(s): VITAMINB12, FOLATE, FERRITIN, TIBC, IRON, RETICCTPCT in the last 72 hours. Sepsis Labs: No results for input(s): PROCALCITON, LATICACIDVEN in the last 168 hours.  Recent Results (from the past 240 hour(s))  Urine culture     Status: Abnormal   Collection Time: 10/31/16  1:47 PM  Result Value Ref Range Status   Specimen Description URINE, RANDOM  Final    Special Requests NONE  Final   Culture (A)  Final    >=100,000 COLONIES/mL ESCHERICHIA COLI Confirmed Extended Spectrum Beta-Lactamase Producer (ESBL) [facility]    Report Status 11/03/2016 FINAL  Final   Organism ID, Bacteria ESCHERICHIA COLI (A)  Final      Susceptibility   Escherichia coli - MIC*    AMPICILLIN >=32 RESISTANT Resistant     CEFAZOLIN >=64 RESISTANT Resistant     CEFTRIAXONE >=64 RESISTANT Resistant     CIPROFLOXACIN >=4 RESISTANT Resistant     GENTAMICIN <=1 SENSITIVE Sensitive     IMIPENEM <=0.25 SENSITIVE Sensitive     NITROFURANTOIN 128 RESISTANT Resistant     TRIMETH/SULFA >=320 RESISTANT Resistant     AMPICILLIN/SULBACTAM >=32 RESISTANT Resistant     PIP/TAZO 8 SENSITIVE Sensitive     Extended ESBL POSITIVE Resistant     * >=100,000 COLONIES/mL ESCHERICHIA COLI  MRSA PCR Screening     Status: None   Collection Time: 11/01/16  10:02 AM  Result Value Ref Range Status   MRSA by PCR NEGATIVE NEGATIVE Final    Comment:        The GeneXpert MRSA Assay (FDA approved for NASAL specimens only), is one component of a comprehensive MRSA colonization surveillance program. It is not intended to diagnose MRSA infection nor to guide or monitor treatment for MRSA infections.          Radiology Studies: Dg Chest Port 1 View  Result Date: 11/03/2016 CLINICAL DATA:  82 year old female with difficulty breathing. EXAM: PORTABLE CHEST 1 VIEW COMPARISON:  Chest radiograph dated 11/02/2016 FINDINGS: Single portable view of the chest demonstrates left lung base hazy density, possibly atelectatic changes. Infiltrate is not excluded. There is no pleural effusion, or pneumothorax. No significant congestive changes or edema. The cardiac silhouette is within normal limits. There is atherosclerotic calcification of the aortic arch. There is osteopenia with degenerative changes of the shoulders. No acute fracture. IMPRESSION: Faint hazy density at the left  lung base may represent atelectatic changes. Infiltrate is less likely but not excluded. Clinical correlation is recommended. Electronically Signed   By: Elgie CollardArash  Radparvar M.D.   On: 11/03/2016 02:02   Dg Chest Port 1 View  Result Date: 11/02/2016 CLINICAL DATA:  Shortness of breath. EXAM: PORTABLE CHEST 1 VIEW COMPARISON:  Radiographs of October 31, 2016. FINDINGS: Stable cardiomediastinal silhouette. No pneumothorax or significant pleural effusion is noted. Mild bibasilar subsegmental atelectasis is noted. Bony thorax is unremarkable. IMPRESSION: Mild bibasilar subsegmental atelectasis. Electronically Signed   By: Lupita RaiderJames  Green Jr, M.D.   On: 11/02/2016 15:46   Dg Abd Portable 1v  Result Date: 11/03/2016 CLINICAL DATA:  Nausea EXAM: PORTABLE ABDOMEN - 1 VIEW COMPARISON:  None. FINDINGS: Mild gaseous distended small bowel loops in left abdomen suspicious for ileus or early bowel obstruction. Gaseous distension distal sigmoid colon. Moderate stool noted within rectum. Degenerative changes lumbar spine. IMPRESSION: Mild gaseous distended small bowel loops in left abdomen suspicious for ileus or early bowel obstruction. Gaseous distension distal sigmoid colon. Moderate stool noted within rectum. Electronically Signed   By: Natasha MeadLiviu  Pop M.D.   On: 11/03/2016 09:14        Scheduled Meds: . ALPRAZolam  1 mg Oral QHS  . bisacodyl  10 mg Rectal Daily  . divalproex  250 mg Oral Q12H  . donepezil  10 mg Oral QHS  . enoxaparin (LOVENOX) injection  30 mg Subcutaneous Q24H  . ferrous sulfate  325 mg Oral Q breakfast  . linaclotide  290 mcg Oral QAC breakfast  . piperacillin-tazobactam (ZOSYN)  IV  3.375 g Intravenous Q8H  . polyethylene glycol  17 g Oral BID   Continuous Infusions: . sodium chloride       LOS: 3 days    Leroy SeaSINGH,Cloyce Paterson K, MD [facility] Pager [phone]  If 7PM-7AM, please contact night-coverage [email or web address] Password [email or web address] 11/03/2016, 10:10 AM

## 2016-11-03 NOTE — Progress Notes (Signed)
Notified Dr. Sharl MaLama, change in pt's status. Pt experiencing labored breathing and stats were between 87-88%. Placed pt on 2L of oxygen and she came up to 96%. Pt's lungs sound wet (rhonchi). Respiratory at bedside. MD responded and ordered chest xray, 20 mg of lasix iv and a BNP. Will continue to monitor pt.

## 2016-11-03 NOTE — Progress Notes (Signed)
Called by RN, the patient was found to be hypoxic with O2 sats 87 percent on room air. Patient put on oxygen 2 L via nasal cannula. Patient found to be mildly tachypenic On exam-chest-bilateral rhonchi Echo from 11/01/16 showed grade 1 diastolic dysfunction   Plan Likely pulmonary edema Check stat chest x-ray portable BNP 1 dose of Lasix 20 mg IV Stop IV fluids

## 2016-11-03 NOTE — Care Management (Signed)
CM contacted by Vickie of [facility]. [facility] made aware. [facility] through [facility].

## 2016-11-04 ENCOUNTER — Inpatient Hospital Stay (HOSPITAL_COMMUNITY): Payer: Medicare Other

## 2016-11-04 LAB — GLUCOSE, CAPILLARY
Glucose-Capillary: 72 mg/dL (ref 65–99)
Glucose-Capillary: 101 mg/dL — ABNORMAL HIGH (ref 65–99)
Glucose-Capillary: 140 mg/dL — ABNORMAL HIGH (ref 65–99)
Glucose-Capillary: 184 mg/dL — ABNORMAL HIGH (ref 65–99)

## 2016-11-04 LAB — CBC
HCT: 28 % — ABNORMAL LOW (ref 36.0–46.0)
HEMOGLOBIN: 9 g/dL — AB (ref 12.0–15.0)
MCH: 28 pg (ref 26.0–34.0)
MCHC: 32.1 g/dL (ref 30.0–36.0)
MCV: 87 fL (ref 78.0–100.0)
Platelets: 170 10*3/uL (ref 150–400)
RBC: 3.22 MIL/uL — AB (ref 3.87–5.11)
RDW: 14.1 % (ref 11.5–15.5)
WBC: 7.8 10*3/uL (ref 4.0–10.5)

## 2016-11-04 LAB — BASIC METABOLIC PANEL
Anion gap: 8 (ref 5–15)
BUN: 13 mg/dL (ref 6–20)
CHLORIDE: 107 mmol/L (ref 101–111)
CO2: 27 mmol/L (ref 22–32)
Calcium: 8 mg/dL — ABNORMAL LOW (ref 8.9–10.3)
Creatinine, Ser: 0.53 mg/dL (ref 0.44–1.00)
GFR calc Af Amer: >60 mL/min (ref >60)
GFR calc non Af Amer: >60 mL/min (ref >60)
GLUCOSE: 95 mg/dL (ref 65–99)
POTASSIUM: 3.1 mmol/L — AB (ref 3.5–5.1)
Sodium: 142 mmol/L (ref 135–145)

## 2016-11-04 MED ORDER — MAGNESIUM HYDROXIDE 400 MG/5ML PO SUSP
30.0000 mL | Freq: Two times a day (BID) | ORAL | Status: AC
  Administered 2016-11-04 (×2): 30 mL via ORAL
  Filled 2016-11-04 (×2): qty 30

## 2016-11-04 MED ORDER — POTASSIUM CHLORIDE 10 MEQ/100ML IV SOLN
10.0000 meq | INTRAVENOUS | Status: AC
Start: 2016-11-04 — End: 2016-11-04
  Administered 2016-11-04 (×4): 10 meq via INTRAVENOUS
  Filled 2016-11-04 (×2): qty 100

## 2016-11-04 MED ORDER — BISACODYL 10 MG RE SUPP
10.0000 mg | Freq: Every day | RECTAL | Status: DC
  Administered 2016-11-04 – 2016-11-05 (×2): 10 mg via RECTAL
  Filled 2016-11-04 (×2): qty 1

## 2016-11-04 MED ORDER — FOSFOMYCIN TROMETHAMINE 3 G PO PACK
3.0000 g | PACK | Freq: Once | ORAL | Status: AC
  Administered 2016-11-04: 3 g via ORAL
  Filled 2016-11-04: qty 3

## 2016-11-04 NOTE — Progress Notes (Signed)
PROGRESS NOTE    Ashlee BradfordMildred F Barron  ZOX:[id number] DOB: [birth year] DOA: 10/31/2016 PCP: No PCP Per Patient   Brief Narrative: 82 y.o. female with medical history significant of constipation, dementia, type 2 diabetes, hypertension who [facility].  Subjective - patient in bed, she is pleasantly confused due to dementia, unreliable historian but appears to be in mild distress, she says her abdominal does not feel good but is not able to clarify any more, denies any chest pain or shortness of breath.  Assessment & Plan:  #  Altered mental status likely acute encephalopathy in the setting of UTI/PNA in underlying dementia: As per record, at baseline patient can walk and talking but recently she is becoming more confused and weak. -Stop all IV antibiotics as she is clinically much better will give her a dose of oral  fosfomycin. -Head CT showed atrophy with small vessel ischemic changes. -Continue supportive care.  #Possible early small bowel obstruction was his ileus on 11/03/2016. resolved with conservative care, placed on bowel regimen monitor another 24 hours   #Acute cystitis without hematuria: Patient is unable to tell me if she has urinary symptoms. Antibiotics on IV Zosyn now. Follow up culture results.  #Left lower lobe opacity, probably atelectasis versus aspiration pneumonia: Antibiotics switched to IV Zosyn. Patient is not hypoxic. Maintaining oxygen saturation in room air.   # Type 2 diabetes mellitus (HCC): Monitor blood sugar level. Last A1c was 6 on November 30.  #Possible dysphagia, oropharyngeal: As per RN patient had episodes of choking this morning. Swallow evaluation requested.   # Dementia, exact type unknown: Likely vascular versus Alzheimer dementia. Patient was calm and comfortable this morning. Continue supportive care. Patient is DO NOT RESUSCITATE. I requested palliative care consult to address goals of care and  possible hospice. On Aricept.  #  Edema of left foot: X-ray with soft tissue swelling. No sign of fracture. Elevate the foot and supportive care.  # Normocytic anemia: Drop in hemoglobin today likely due to dilutional. Repeat CBC in the morning. No sign of bleeding.  #Hypokalemia. Replaced.   DVT prophylaxis: Lovenox subcutaneous Code Status: DO NOT RESUSCITATE Family Communication: As cussed with multiple family members bedside on 11/03/2016, DO NOT RESUSCITATE, gentle medical treatment, if declines full hospice/comfort care Disposition Plan: Likely discharge to SNF in am   Consultants:   None  Discussed with the case manager and social worker regarding discharge planning.  Procedures: None Antimicrobials: Discontinue Levaquin and ceftriaxone and he started Zosyn today.    Objective: Vitals:   11/03/16 1456 11/03/16 2020 11/03/16 2058 11/04/16 0406  BP: (!) 139/96  (!) 123/50 (!) 130/57  Pulse: (!) 57  73 78  Resp: 20  16 20   Temp:   98 F (36.7 C) 97.4 F (36.3 C)  TempSrc:   Oral Oral  SpO2: 100% 100% 100% 100%    Intake/Output Summary (Last 24 hours) at 11/04/16 0900 Last data filed at 11/04/16 0700  Gross per 24 hour  Intake           996.25 ml  Output              450 ml  Net           546.25 ml   There were no vitals filed for this visit.  Examination:  General exam: Confused elderly female lying in bed looks comfortable.   Respiratory system: Clear to auscultation. Respiratory effort normal. No wheezing or  crackle Cardiovascular system: S1 & S2 heard, RRR.  No pedal edema. Gastrointestinal system: Abdomen is nondistended, soft but ? Mild tenderness. Hypoactive bowel sounds heard. Central nervous system: Alert awake and oriented to herself only. Skin: No rashes, lesions or ulcers Psychiatry: Judgement and insight appear impaired. Patient has dementia.    Data Reviewed: I have personally reviewed following labs and imaging studies  CBC:  Recent  Labs Lab 10/31/16 1451 11/01/16 0552 11/02/16 0618 11/04/16 0521  WBC 8.7 5.8 7.1 7.8  NEUTROABS 6.9 3.7 4.5  --   HGB 12.0 10.7* 9.2* 9.0*  HCT 38.1 34.6* 28.7* 28.0*  MCV 89.9 87.4 87.0 87.0  PLT 165 142* 152 170   Basic Metabolic Panel:  Recent Labs Lab 10/31/16 1451 11/01/16 0552 11/04/16 0521  NA 138 139 142  K 4.1 4.4 3.1*  CL 104 109 107  CO2 21* 24 27  GLUCOSE 123* 90 95  BUN 19 25* 13  CREATININE 0.86 0.61 0.53  CALCIUM 9.0 8.2* 8.0*   GFR: CrCl cannot be calculated (Unknown ideal weight.). Liver Function Tests:  Recent Labs Lab 10/31/16 1451 11/01/16 0552  AST 22 20  ALT 11* 10*  ALKPHOS 89 71  BILITOT 0.6 0.5  PROT 7.3 6.3*  ALBUMIN 2.9* 2.3*   No results for input(s): LIPASE, AMYLASE in the last 168 hours. No results for input(s): AMMONIA in the last 168 hours. Coagulation Profile: No results for input(s): INR, PROTIME in the last 168 hours. Cardiac Enzymes: No results for input(s): CKTOTAL, CKMB, CKMBINDEX, TROPONINI in the last 168 hours. BNP (last 3 results) No results for input(s): PROBNP in the last 8760 hours. HbA1C: No results for input(s): HGBA1C in the last 72 hours. CBG:  Recent Labs Lab 11/03/16 0745 11/03/16 1155 11/03/16 1633 11/03/16 2136 11/04/16 0742  GLUCAP 125* 171* 139* 120* 72   Lipid Profile: No results for input(s): CHOL, HDL, LDLCALC, TRIG, CHOLHDL, LDLDIRECT in the last 72 hours. Thyroid Function Tests: No results for input(s): TSH, T4TOTAL, FREET4, T3FREE, THYROIDAB in the last 72 hours. Anemia Panel: No results for input(s): VITAMINB12, FOLATE, FERRITIN, TIBC, IRON, RETICCTPCT in the last 72 hours. Sepsis Labs: No results for input(s): PROCALCITON, LATICACIDVEN in the last 168 hours.  Recent Results (from the past 240 hour(s))  Urine culture     Status: Abnormal   Collection Time: 10/31/16  1:47 PM  Result Value Ref Range Status   Specimen Description URINE, RANDOM  Final   Special Requests NONE   Final   Culture (A)  Final    >=100,000 COLONIES/mL ESCHERICHIA COLI Confirmed Extended Spectrum Beta-Lactamase Producer (ESBL) Performed at [facility]    Report Status 11/03/2016 FINAL  Final   Organism ID, Bacteria ESCHERICHIA COLI (A)  Final      Susceptibility   Escherichia coli - MIC*    AMPICILLIN >=32 RESISTANT Resistant     CEFAZOLIN >=64 RESISTANT Resistant     CEFTRIAXONE >=64 RESISTANT Resistant     CIPROFLOXACIN >=4 RESISTANT Resistant     GENTAMICIN <=1 SENSITIVE Sensitive     IMIPENEM <=0.25 SENSITIVE Sensitive     NITROFURANTOIN 128 RESISTANT Resistant     TRIMETH/SULFA >=320 RESISTANT Resistant     AMPICILLIN/SULBACTAM >=32 RESISTANT Resistant     PIP/TAZO 8 SENSITIVE Sensitive     Extended ESBL POSITIVE Resistant     * >=100,000 COLONIES/mL ESCHERICHIA COLI  MRSA PCR Screening     Status: None   Collection Time: 11/01/16 10:02 AM  Result Value  Ref Range Status   MRSA by PCR NEGATIVE NEGATIVE Final    Comment:        The GeneXpert MRSA Assay (FDA approved for NASAL specimens only), is one component of a comprehensive MRSA colonization surveillance program. It is not intended to diagnose MRSA infection nor to guide or monitor treatment for MRSA infections.          Radiology Studies: Dg Chest Port 1 View  Result Date: 11/03/2016 CLINICAL DATA:  82 year old female with difficulty breathing. EXAM: PORTABLE CHEST 1 VIEW COMPARISON:  Chest radiograph dated 11/02/2016 FINDINGS: Single portable view of the chest demonstrates left lung base hazy density, possibly atelectatic changes. Infiltrate is not excluded. There is no pleural effusion, or pneumothorax. No significant congestive changes or edema. The cardiac silhouette is within normal limits. There is atherosclerotic calcification of the aortic arch. There is osteopenia with degenerative changes of the shoulders. No acute fracture. IMPRESSION: Faint hazy density at the left lung base may represent  atelectatic changes. Infiltrate is less likely but not excluded. Clinical correlation is recommended. Electronically Signed   By: Elgie CollardArash  Radparvar M.D.   On: 11/03/2016 02:02   Dg Chest Port 1 View  Result Date: 11/02/2016 CLINICAL DATA:  Shortness of breath. EXAM: PORTABLE CHEST 1 VIEW COMPARISON:  Radiographs of October 31, 2016. FINDINGS: Stable cardiomediastinal silhouette. No pneumothorax or significant pleural effusion is noted. Mild bibasilar subsegmental atelectasis is noted. Bony thorax is unremarkable. IMPRESSION: Mild bibasilar subsegmental atelectasis. Electronically Signed   By: Lupita RaiderJames  Green Jr, M.D.   On: 11/02/2016 15:46   Dg Abd Portable 1v  Result Date: 11/04/2016 CLINICAL DATA:  Small bowel obstruction. EXAM: PORTABLE ABDOMEN - 1 VIEW COMPARISON:  11/03/2016 FINDINGS: There is decreased gaseous distention of small and large bowel loops compared to the prior study. Gas is currently present in scattered loops of nondilated small and large bowel without evidence of obstruction. The right lateral abdomen was incompletely imaged. Evaluation for intraperitoneal free air is limited on this supine study. Diffuse lumbar disc degeneration is noted. IMPRESSION: Nonobstructed bowel gas pattern. Electronically Signed   By: Sebastian AcheAllen  Grady M.D.   On: 11/04/2016 07:27   Dg Abd Portable 1v  Result Date: 11/03/2016 CLINICAL DATA:  Nausea EXAM: PORTABLE ABDOMEN - 1 VIEW COMPARISON:  None. FINDINGS: Mild gaseous distended small bowel loops in left abdomen suspicious for ileus or early bowel obstruction. Gaseous distension distal sigmoid colon. Moderate stool noted within rectum. Degenerative changes lumbar spine. IMPRESSION: Mild gaseous distended small bowel loops in left abdomen suspicious for ileus or early bowel obstruction. Gaseous distension distal sigmoid colon. Moderate stool noted within rectum. Electronically Signed   By: Natasha MeadLiviu  Pop M.D.   On: 11/03/2016 09:14        Scheduled Meds: .  bisacodyl  10 mg Rectal Daily  . divalproex  250 mg Oral Q12H  . donepezil  10 mg Oral QHS  . enoxaparin (LOVENOX) injection  30 mg Subcutaneous Q24H  . ferrous sulfate  325 mg Oral Q breakfast  . linaclotide  290 mcg Oral QAC breakfast  . magnesium hydroxide  30 mL Oral BID  . piperacillin-tazobactam (ZOSYN)  IV  3.375 g Intravenous Q8H  . polyethylene glycol  17 g Oral BID  . potassium chloride  10 mEq Intravenous Q1 Hr x 4   Continuous Infusions:    LOS: 4 days    Leroy SeaSINGH,Willim Turnage K, MD [facility] Pager [phone]  If 7PM-7AM, please contact night-coverage [email or web address] Password [email or web address] 11/04/2016, 9:00 AM

## 2016-11-04 NOTE — Clinical Social Work Note (Signed)
[facility]. CSW spoke with Damian Leavellrudy at facility and provided update. Pharmacy aware if there are any medication changes pt will need meds through weekend. [facility] that if pt declines, family would be interested in hospice. Facility has been providing total care for pt and remain agreeable to return.  Derenda FennelKara Lautaro Koral, LCSW [phone]

## 2016-11-05 LAB — BASIC METABOLIC PANEL
Anion gap: 9 (ref 5–15)
BUN: 11 mg/dL (ref 6–20)
CHLORIDE: 102 mmol/L (ref 101–111)
CO2: 27 mmol/L (ref 22–32)
Calcium: 8.5 mg/dL — ABNORMAL LOW (ref 8.9–10.3)
Creatinine, Ser: 0.54 mg/dL (ref 0.44–1.00)
GFR calc Af Amer: >60 mL/min (ref >60)
GFR calc non Af Amer: >60 mL/min (ref >60)
GLUCOSE: 210 mg/dL — AB (ref 65–99)
POTASSIUM: 3.5 mmol/L (ref 3.5–5.1)
SODIUM: 138 mmol/L (ref 135–145)

## 2016-11-05 LAB — GLUCOSE, CAPILLARY
Glucose-Capillary: 204 mg/dL — ABNORMAL HIGH (ref 65–99)
Glucose-Capillary: 209 mg/dL — ABNORMAL HIGH (ref 65–99)

## 2016-11-05 LAB — MAGNESIUM: MAGNESIUM: 1.6 mg/dL — AB (ref 1.7–2.4)

## 2016-11-05 MED ORDER — ENOXAPARIN SODIUM 40 MG/0.4ML ~~LOC~~ SOLN: 40.0000 mg | SUBCUTANEOUS | Status: DC

## 2016-11-05 MED ORDER — BISACODYL 10 MG RE SUPP: 10.0000 mg | Freq: Every day | RECTAL | 0 refills | Status: DC

## 2016-11-05 NOTE — Clinical Social Work Note (Addendum)
[facility] She states that the patient can return today and that she will call the unit to determine when they can pick up the patient. The facility states that they have a copy of the patient's discharge summary and FL2. Son Myrla HalstedLeon Farrish updated on discharge plan. CSW signing off at this time.   Roddie McBryant Devon Kingdon MSW, [place]CSW, [place], [phone]

## 2016-11-05 NOTE — [place] FL2 (Signed)
[place] MEDICAID FL2 LEVEL OF CARE SCREENING TOOL     IDENTIFICATION  Patient Name: Ashlee Barron Birthdate: [birth year] Sex: female Admission Date (Current Location): 10/31/2016  [facility] [place]   Facility and Address:  [facility],  [address], [place] [id number]      Provider Number: [id number]  Attending Physician Name and Address:  Leroy SeaPrashant K Singh, MD  Relative Name and Phone Number:       Current Level of Care: Hospital Recommended Level of Care: Assisted Living Facility Prior Approval Number:    Date Approved/Denied:   PASRR Number:    Discharge Plan: Other (Comment) (ALF)    Current Diagnoses: Patient Active Problem List   Diagnosis Date Noted  . Palliative care encounter   . Goals of care, counseling/discussion   . Hypoalbuminemia 11/01/2016  . Edema of left foot 11/01/2016  . Acute cystitis without hematuria   . Sepsis secondary to UTI (HCC) 10/31/2016  . Type 2 diabetes mellitus (HCC) 10/31/2016  . Dementia with behavioral disturbance 10/31/2016  . Essential hypertension 10/31/2016  . Aspiration pneumonia (HCC) 10/31/2016  . Abnormal EKG 10/31/2016  . Altered mental status 10/19/2016  . Constipation 09/09/2016  . Dehydration 09/09/2016  . Diabetes (HCC) 09/09/2016    Orientation RESPIRATION BLADDER Height & Weight     Self  Normal Indwelling catheter Weight: 56 kg (123 lb 7.3 oz) Height:  5\' 5"  (165.1 cm)  BEHAVIORAL SYMPTOMS/MOOD NEUROLOGICAL BOWEL NUTRITION STATUS  Other (Comment) (none)  (n/a) Incontinent Diet (Heart healthy)  AMBULATORY STATUS COMMUNICATION OF NEEDS Skin   Total Care Verbally Skin abrasions                       Personal Care Assistance Level of Assistance    Bathing Assistance: Maximum assistance Feeding assistance: Maximum assistance Dressing Assistance: Maximum assistance     Functional Limitations Info    Sight Info: Adequate Hearing Info: Adequate Speech  Info: Adequate    SPECIAL CARE FACTORS FREQUENCY                       Contractures      Additional Factors Info  Psychotropic Code Status Info: DNR Allergies Info: No known allergies Psychotropic Info: Xanax, Depakote, Haldol   Isolation Precautions Info: E.coli ESBL 08/16/16     Discharge Medication List Medication List    STOP taking these medications   cephALEXin 500 MG capsule Commonly known as:  KEFLEX  levofloxacin 750 MG tablet Commonly known as:  LEVAQUIN    TAKE these medications   acetaminophen 500 MG tablet Commonly known as:  TYLENOL Take 500 mg by mouth 3 (three) times daily as needed for mild pain.  ALPRAZolam 0.25 MG tablet Commonly known as:  XANAX Take 0.25 mg by mouth daily.  ALPRAZolam 1 MG tablet Commonly known as:  XANAX Take 1 mg by mouth at bedtime.  bisacodyl 10 MG suppository Commonly known as:  DULCOLAX Place 1 suppository (10 mg total) rectally daily. Hold if patient having diarrhea What changed:  when to take this  reasons to take this  additional instructions  divalproex 250 MG DR tablet Commonly known as:  DEPAKOTE Take 250 mg by mouth 2 (two) times daily.  donepezil 10 MG tablet Commonly known as:  ARICEPT Take 10 mg by mouth at bedtime.  ferrous sulfate 325 (65 FE) MG tablet Take 325 mg by mouth daily with breakfast.  haloperidol 1  MG tablet Commonly known as:  HALDOL Take 1 tablet (1 mg total) by mouth daily as needed for agitation.  linaclotide 290 MCG Caps capsule Commonly known as:  LINZESS Take 1 capsule (290 mcg total) by mouth daily before breakfast.  polyethylene glycol packet Commonly known as:  MIRALAX / GLYCOLAX Take 17 g by mouth 2 (two) times daily.  traMADol 50 MG tablet Commonly known as:  ULTRAM Take 50 mg by mouth every 12 (twelve) hours as needed for moderate pain or severe pain.     Discharge Medications: Please see discharge summary for a list of discharge medications.  Relevant  Imaging Results:  Relevant Lab Results:   Additional Information    Venita Lickampbell, Ashlee Geffert B, LCSW

## 2016-11-05 NOTE — Progress Notes (Signed)
[facility]. Discharge instructions reviewed with her nephew Shon HaleLeon at bedside this afternoon. He and family at bedside state he typically arranges for her care and signs AVS. Verbalized understanding of instructions and [facility]. IV site d/c'd and within normal limits. Home health to resume with [facility] per CM arrangments. [facility]. Pt left floor in stable condition via w/c this afternoon accompanied [facility] nurse tech and [facility]. Discharge packet sent with patient. Earnstine RegalAshley Shiquan Mathieu, RN

## 2016-11-05 NOTE — Discharge Summary (Signed)
Ashlee BradfordMildred F Barron ZOX:[id number] DOB: 09-19-1928 DOA: 10/31/2016  PCP: No PCP Per Patient  Admit date: 10/31/2016  Discharge date: 11/05/2016  Admitted From: ALF   Disposition:  ALF   Recommendations for Outpatient Follow-up:   Follow up with PCP in 1-2 weeks  PCP Please obtain BMP/CBC, 2 view CXR in 1week,  (see Discharge instructions)   PCP Please follow up on the following pending results: None   Home Health: PT,RN,Aide,S Work   Equipment/Devices: None  Consultations: None Discharge Condition: Guarded   CODE STATUS: DNR   Diet Recommendation: Soft diet with feeding assistance and aspiration precautions   Chief Complaint  Patient presents with  . Altered Mental Status     Brief history of present illness from the day of admission and additional interim summary    82 y.o.femalewith medical history significant of constipation, dementia, type 2 diabetes, hypertension [facility] to altered mental status.  Subjective - patient in bed, she is pleasantly confused due to dementia, unreliable historian but appears to be in mild distress, she says her abdominal does not feel good but is not able to clarify any more, denies any chest pain or shortness of breath.  Hospital issues addressed     #  Altered mental status likely acute encephalopathy in the setting of UTI/PNA in underlying dementia: As per record, at baseline patient can walk and talking but recently she is becoming more confused and weak.  She was treated with empiric antibiotics initially and then with fosfomycin, clinically all infection has resolved and she is back to her baseline, I had detailed discussions with family bedside, she is DO NOT RESUSCITATE, goal is now gentle medical treatment directed towards comfort, if  she declines any further full comfort care/hospice.  #Possible early small bowel obstruction was his ileus on 11/03/2016. resolved with conservative care, placed on bowel regimen monitor for constipation or ileus in the outpatient setting.    #Acute cystitis without hematuria: Initial treatment with Zosyn.  #Left lower lobe opacity, probably atelectasis versus aspiration pneumonia: Finished her treatment with antibiotics.   # Type 2 diabetes mellitus (HCC): Monitor blood sugar level. Last A1c was 6 on November 30.She is likely prediabetic rather than type II diabetic.  #Possible dysphagia, oropharyngeal: Seen by speech on soft diet with feeding assistance and aspiration precautions.   # Dementia, exact type unknown: Likely vascular versus Alzheimer dementia. Patient was calm and comfortable this morning. Continue supportive care. Patient is DO NOT RESUSCITATE. On Aricept.Had detailed discussions with palliative care and family if she declines full comfort care and hospice.  #  Edema of left foot: X-ray with soft tissue swelling. No sign of fracture. Elevate the foot and supportive care.  # Normocytic anemia: Drop in hemoglobin today likely due to dilutional. Stable H&H. No sign of bleeding. Outpatient monitoring. No further workup is needed due to her age.  #Hypokalemia. Replaced and stable.   Discharge diagnosis     Principal Problem:   Sepsis secondary to UTI [facility](HCC) Active  Problems:   Altered mental status   Type 2 diabetes mellitus (HCC)   Dementia with behavioral disturbance   Essential hypertension   Aspiration pneumonia (HCC)   Abnormal EKG   Hypoalbuminemia   Edema of left foot   Acute cystitis without hematuria   Palliative care encounter   Goals of care, counseling/discussion    Discharge instructions    Discharge Instructions    Discharge instructions    Complete by:  As directed    Follow with Primary MD in 7 days   Get CBC, CMP, 2 view Chest X ray  checked  by Primary MD or SNF MD in 5-7 days ( we routinely change or add medications that can affect your baseline labs and fluid status, therefore we recommend that you get the mentioned basic workup next visit with your PCP, your PCP may decide not to get them or add new tests based on their clinical decision)   Activity: As tolerated with Full fall precautions use walker/cane & assistance as needed   Disposition ALF   Diet:   Soft diet with feeding assistance and aspiration precautions.  For Heart failure patients - Check your Weight same time everyday, if you gain over 2 pounds, or you develop in leg swelling, experience more shortness of breath or chest pain, call your Primary MD immediately. Follow Cardiac Low Salt Diet and 1.5 lit/day fluid restriction.   On your next visit with your primary care physician please Get Medicines reviewed and adjusted.   Please request your Prim.MD to go over all Hospital Tests and Procedure/Radiological results at the follow up, please get all Hospital records sent to your Prim MD by signing hospital release before you go home.   If you experience worsening of your admission symptoms, develop shortness of breath, life threatening emergency, suicidal or homicidal thoughts you must seek medical attention immediately by calling 911 or calling your MD immediately  if symptoms less severe.  You Must read complete instructions/literature along with all the possible adverse reactions/side effects for all the Medicines you take and that have been prescribed to you. Take any new Medicines after you have completely understood and accpet all the possible adverse reactions/side effects.   Do not drive, operate heavy machinery, perform activities at heights, swimming or participation in water activities or provide baby sitting services if your were admitted for syncope or siezures until you have seen by Primary MD or a Neurologist and advised to do so again.  Do  not drive when taking Pain medications.    Do not take more than prescribed Pain, Sleep and Anxiety Medications  Special Instructions: If you have smoked or chewed Tobacco  in the last 2 yrs please stop smoking, stop any regular Alcohol  and or any Recreational drug use.  Wear Seat belts while driving.   Please note  You were cared for by a hospitalist during your hospital stay. If you have any questions about your discharge medications or the care you received while you were in the hospital after you are discharged, you can call the unit and asked to speak with the hospitalist on call if the hospitalist that took care of you is not available. Once you are discharged, your primary care physician will handle any further medical issues. Please note that NO REFILLS for any discharge medications will be authorized once you are discharged, as it is imperative that you return to your primary care physician (or establish a relationship with a primary care physician  if you do not have one) for your aftercare needs so that they can reassess your need for medications and monitor your lab values.   Increase activity slowly    Complete by:  As directed       Discharge Medications   Allergies as of 11/05/2016   No Known Allergies     Medication List    STOP taking these medications   cephALEXin 500 MG capsule Commonly known as:  KEFLEX   levofloxacin 750 MG tablet Commonly known as:  LEVAQUIN     TAKE these medications   acetaminophen 500 MG tablet Commonly known as:  TYLENOL Take 500 mg by mouth 3 (three) times daily as needed for mild pain.   ALPRAZolam 0.25 MG tablet Commonly known as:  XANAX Take 0.25 mg by mouth daily.   ALPRAZolam 1 MG tablet Commonly known as:  XANAX Take 1 mg by mouth at bedtime.   bisacodyl 10 MG suppository Commonly known as:  DULCOLAX Place 1 suppository (10 mg total) rectally daily. Hold if patient having diarrhea What changed:  when to take  this  reasons to take this  additional instructions   divalproex 250 MG DR tablet Commonly known as:  DEPAKOTE Take 250 mg by mouth 2 (two) times daily.   donepezil 10 MG tablet Commonly known as:  ARICEPT Take 10 mg by mouth at bedtime.   ferrous sulfate 325 (65 FE) MG tablet Take 325 mg by mouth daily with breakfast.   haloperidol 1 MG tablet Commonly known as:  HALDOL Take 1 tablet (1 mg total) by mouth daily as needed for agitation.   linaclotide 290 MCG Caps capsule Commonly known as:  LINZESS Take 1 capsule (290 mcg total) by mouth daily before breakfast.   polyethylene glycol packet Commonly known as:  MIRALAX / GLYCOLAX Take 17 g by mouth 2 (two) times daily.   traMADol 50 MG tablet Commonly known as:  ULTRAM Take 50 mg by mouth every 12 (twelve) hours as needed for moderate pain or severe pain.       Major procedures and Radiology Reports - PLEASE review detailed and final reports thoroughly  -         Dg Chest 1 View  Result Date: 10/20/2016 CLINICAL DATA:  Acute onset of altered mental status. Initial encounter. EXAM: CHEST 1 VIEW COMPARISON:  Chest radiograph performed 12/18/2013 FINDINGS: The lungs are hypoexpanded. Peribronchial thickening is noted. Mild bilateral atelectasis seen. No pleural effusion or pneumothorax is identified. The cardiomediastinal silhouette is within normal limits. No acute osseous abnormalities are seen. IMPRESSION: Lungs hypoexpanded. Peribronchial thickening noted. Mild bilateral atelectasis seen. Electronically Signed   By: Roanna Raider M.D.   On: 10/20/2016 02:39   Dg Chest 2 View  Result Date: 10/31/2016 CLINICAL DATA:  Altered mental status EXAM: CHEST  2 VIEW COMPARISON:  Chest radiograph 10/26/2016 FINDINGS: Aeration of the lung bases is improved compared to the prior study. There persistent medial left basilar opacities with no new evidence of consolidation. Linear opacities along the right min major or fissure may  indicate a small amount of pleural fluid. No pneumothorax or sizable pleural effusion. Unchanged mid thoracic compression deformity. IMPRESSION: 1. Unchanged medial left basilar opacities, which could represent aspiration, pneumonia or atelectasis. 2. Small amount of right fissural pleural fluid. Electronically Signed   By: Deatra Robinson M.D.   On: 10/31/2016 14:25   Dg Chest 2 View  Result Date: 10/26/2016 CLINICAL DATA:  82 year old female unresponsive at breakfast today.  Hypoxia. Initial encounter. EXAM: CHEST  2 VIEW COMPARISON:  10/20/2016, 12/18/2013 FINDINGS: Seated upright AP and lateral views of the chest. Low lung volumes. Chronic mild to moderate elevation of the right hemidiaphragm appears stable. Confluent pulmonary opacity at the medial left lung base, may involved the lingula and the lower lobe (arrows). No definite pleural effusion. No pneumothorax or pulmonary edema. Stable cardiac size and mediastinal contours. Visualized tracheal air column is within normal limits. No acute osseous abnormality identified. IMPRESSION: Abnormal left lung base opacity suspicious for aspiration and/or pneumonia in this clinical setting. No pleural effusion. Continued low lung volumes. Calcified aortic atherosclerosis. Electronically Signed   By: Odessa Fleming M.D.   On: 10/26/2016 11:36   Ct Head Wo Contrast  Result Date: 10/31/2016 CLINICAL DATA:  Altered mental status, dementia EXAM: CT HEAD WITHOUT CONTRAST TECHNIQUE: Contiguous axial images were obtained from the base of the skull through the vertex without intravenous contrast. COMPARISON:  10/20/2016 FINDINGS: Brain: No evidence of acute infarction, hemorrhage, hydrocephalus, extra-axial collection or mass lesion/mass effect. Vascular: Intracranial atherosclerosis. Skull: Normal. Negative for fracture or focal lesion. Sinuses/Orbits: The visualized paranasal sinuses are essentially clear. The mastoid air cells are unopacified. Other: Global cortical atrophy.   Secondary ventricular prominence. Subcortical white matter and periventricular small vessel ischemic changes. IMPRESSION: No evidence of acute intracranial abnormality. Atrophy with small vessel ischemic changes. Electronically Signed   By: Charline Bills M.D.   On: 10/31/2016 14:36   Ct Head Wo Contrast  Result Date: 10/20/2016 CLINICAL DATA:  Altered mental status EXAM: CT HEAD WITHOUT CONTRAST TECHNIQUE: Contiguous axial images were obtained from the base of the skull through the vertex without intravenous contrast. COMPARISON:  09/20/2016 FINDINGS: Brain: No acute territorial infarction, intracranial hemorrhage or focal mass lesion is visualized. Mild atrophy. Moderate periventricular white matter hypodensity consistent with small vessel disease. Ventricles are similar in size and morphology. Vascular: No hyperdense vessels.  Carotid artery calcifications. Skull: Mastoid air cells clear. No fracture. Left frontal exostosis unchanged. Sinuses/Orbits: Paranasal sinuses grossly clear. No acute orbital abnormality. Other: None IMPRESSION: No CT evidence for acute intracranial abnormality. Moderate periventricular white matter hypodensity consistent with small vessel disease. Electronically Signed   By: Jasmine Pang M.D.   On: 10/20/2016 02:44   Dg Chest Port 1 View  Result Date: 11/03/2016 CLINICAL DATA:  82 year old female with difficulty breathing. EXAM: PORTABLE CHEST 1 VIEW COMPARISON:  Chest radiograph dated 11/02/2016 FINDINGS: Single portable view of the chest demonstrates left lung base hazy density, possibly atelectatic changes. Infiltrate is not excluded. There is no pleural effusion, or pneumothorax. No significant congestive changes or edema. The cardiac silhouette is within normal limits. There is atherosclerotic calcification of the aortic arch. There is osteopenia with degenerative changes of the shoulders. No acute fracture. IMPRESSION: Faint hazy density at the left lung base may  represent atelectatic changes. Infiltrate is less likely but not excluded. Clinical correlation is recommended. Electronically Signed   By: Elgie Collard M.D.   On: 11/03/2016 02:02   Dg Chest Port 1 View  Result Date: 11/02/2016 CLINICAL DATA:  Shortness of breath. EXAM: PORTABLE CHEST 1 VIEW COMPARISON:  Radiographs of October 31, 2016. FINDINGS: Stable cardiomediastinal silhouette. No pneumothorax or significant pleural effusion is noted. Mild bibasilar subsegmental atelectasis is noted. Bony thorax is unremarkable. IMPRESSION: Mild bibasilar subsegmental atelectasis. Electronically Signed   By: Lupita Raider, M.D.   On: 11/02/2016 15:46   Dg Knee Complete 4 Views Right  Result Date: 10/09/2016 CLINICAL DATA:  Right knee pain, swelling.  No known injury. EXAM: RIGHT KNEE - COMPLETE 4+ VIEW COMPARISON:  None. FINDINGS: Chondrocalcinosis with diffuse degenerative joint disease changes. Large joint effusion. Intraarticular loose bodies noted. No acute fracture, subluxation or dislocation. IMPRESSION: Chondrocalcinosis with moderate tricompartment degenerative changes, intraarticular loose bodies and large joint effusion. No acute bony abnormality. Electronically Signed   By: Charlett Nose M.D.   On: 10/09/2016 14:31   Dg Abd Portable 1v  Result Date: 11/04/2016 CLINICAL DATA:  Small bowel obstruction. EXAM: PORTABLE ABDOMEN - 1 VIEW COMPARISON:  11/03/2016 FINDINGS: There is decreased gaseous distention of small and large bowel loops compared to the prior study. Gas is currently present in scattered loops of nondilated small and large bowel without evidence of obstruction. The right lateral abdomen was incompletely imaged. Evaluation for intraperitoneal free air is limited on this supine study. Diffuse lumbar disc degeneration is noted. IMPRESSION: Nonobstructed bowel gas pattern. Electronically Signed   By: Sebastian Ache M.D.   On: 11/04/2016 07:27   Dg Abd Portable 1v  Result Date:  11/03/2016 CLINICAL DATA:  Nausea EXAM: PORTABLE ABDOMEN - 1 VIEW COMPARISON:  None. FINDINGS: Mild gaseous distended small bowel loops in left abdomen suspicious for ileus or early bowel obstruction. Gaseous distension distal sigmoid colon. Moderate stool noted within rectum. Degenerative changes lumbar spine. IMPRESSION: Mild gaseous distended small bowel loops in left abdomen suspicious for ileus or early bowel obstruction. Gaseous distension distal sigmoid colon. Moderate stool noted within rectum. Electronically Signed   By: Natasha Mead M.D.   On: 11/03/2016 09:14   Dg Foot 2 Views Left  Result Date: 11/01/2016 CLINICAL DATA:  Soft tissue edema EXAM: LEFT FOOT - 2 VIEW COMPARISON:  October 05, 2010 FINDINGS: Frontal and lateral views were obtained. Bones are osteoporotic. There are flexion deformities of the second, third, fourth, and fifth MTP joints. There is narrowing of all MTP, PIP, and DIP joints. No acute fracture or dislocation. There is osteoarthritic change throughout the dorsal midfoot and ankle joint regions. There are small posterior and inferior calcaneal spurs. There is soft tissue swelling without soft tissue air or evidence of soft tissue abscess. IMPRESSION: Soft tissue edema without radiographically demonstrable abscess or soft tissue air. Multilevel arthropathy. No acute fracture or dislocation. No bony destruction. Bones appear osteoporotic. Electronically Signed   By: Bretta Bang III M.D.   On: 11/01/2016 09:47    Micro Results     Recent Results (from the past 240 hour(s))  Urine culture     Status: Abnormal   Collection Time: 10/31/16  1:47 PM  Result Value Ref Range Status   Specimen Description URINE, RANDOM  Final   Special Requests NONE  Final   Culture (A)  Final    >=100,000 COLONIES/mL ESCHERICHIA COLI Confirmed Extended Spectrum Beta-Lactamase Producer (ESBL) Performed at [facility]    Report Status 11/03/2016 FINAL  Final   Organism ID,  Bacteria ESCHERICHIA COLI (A)  Final      Susceptibility   Escherichia coli - MIC*    AMPICILLIN >=32 RESISTANT Resistant     CEFAZOLIN >=64 RESISTANT Resistant     CEFTRIAXONE >=64 RESISTANT Resistant     CIPROFLOXACIN >=4 RESISTANT Resistant     GENTAMICIN <=1 SENSITIVE Sensitive     IMIPENEM <=0.25 SENSITIVE Sensitive     NITROFURANTOIN 128 RESISTANT Resistant     TRIMETH/SULFA >=320 RESISTANT Resistant     AMPICILLIN/SULBACTAM >=32 RESISTANT Resistant     PIP/TAZO 8 SENSITIVE Sensitive  Extended ESBL POSITIVE Resistant     * >=100,000 COLONIES/mL ESCHERICHIA COLI  MRSA PCR Screening     Status: None   Collection Time: 11/01/16 10:02 AM  Result Value Ref Range Status   MRSA by PCR NEGATIVE NEGATIVE Final    Comment:        The GeneXpert MRSA Assay (FDA approved for NASAL specimens only), is one component of a comprehensive MRSA colonization surveillance program. It is not intended to diagnose MRSA infection nor to guide or monitor treatment for MRSA infections.     Today   Subjective    Ashlee PaliMildred Pittman today has no headache,no chest abdominal pain,no new weakness tingling or numbness, feels much better wants to go home today. Unreliable historian   Objective   Blood pressure 150/75, pulse 88 temperature 98 F (36.7 C), temperature source Oral, resp. rate 20, height 5\' 5"  (1.651 m), weight 56 kg (123 lb 7.3 oz), SpO2 100 %.   Intake/Output Summary (Last 24 hours) at 11/05/16 0903 Last data filed at 11/05/16 [id number]  Gross per 24 hour  Intake              400 ml  Output             1500 ml  Net            -1100 ml    Exam Awake,Comfortable but pleasantly confused, No new F.N deficits, Normal affect [place].AT,PERRAL Supple Neck,No JVD, No cervical lymphadenopathy appriciated.  Symmetrical Chest wall movement, Good air movement bilaterally, CTAB RRR,No Gallops,Rubs or new Murmurs, No Parasternal Heave +ve B.Sounds, Abd Soft, Non tender, No organomegaly  appriciated, No rebound -guarding or rigidity. No Cyanosis, Clubbing or edema, No new Rash or bruise   Data Review   CBC w Diff: Lab Results  Component Value Date   WBC 7.8 11/04/2016   HGB 9.0 (L) 11/04/2016   HGB 11.4 (L) 12/18/2013   HCT 28.0 (L) 11/04/2016   HCT 35.6 12/18/2013   PLT 170 11/04/2016   PLT 207 12/18/2013   LYMPHOPCT 25 11/02/2016   MONOPCT 11 11/02/2016   EOSPCT 1 11/02/2016   BASOPCT 0 11/02/2016    CMP: Lab Results  Component Value Date   NA 138 11/05/2016   NA 139 12/18/2013   K 3.5 11/05/2016   K 4.3 12/18/2013   CL 102 11/05/2016   CL 107 12/18/2013   CO2 27 11/05/2016   CO2 25 12/18/2013   BUN 11 11/05/2016   BUN 11 12/18/2013   CREATININE 0.54 11/05/2016   CREATININE 0.81 12/18/2013   PROT 6.3 (L) 11/01/2016   PROT 8.7 (H) 12/18/2013   ALBUMIN 2.3 (L) 11/01/2016   ALBUMIN 3.9 12/18/2013   BILITOT 0.5 11/01/2016   BILITOT 0.1 (L) 12/18/2013   ALKPHOS 71 11/01/2016   ALKPHOS 221 (H) 12/18/2013   AST 20 11/01/2016   AST 54 (H) 12/18/2013   ALT 10 (L) 11/01/2016   ALT 35 12/18/2013  .   Total Time in preparing paper work, data evaluation and todays exam - 35 minutes  Leroy SeaSINGH,Yalanda Soderman K M.D on 11/05/2016 at 9:03 AM  [facility]   Office  [phone]

## 2016-11-05 NOTE — Progress Notes (Signed)
CM contacted [facility] and aware that patient is being [facility].

## 2016-11-05 NOTE — Discharge Instructions (Signed)
Follow with Primary MD in 7 days   Get CBC, CMP, 2 view Chest X ray checked  by Primary MD or ALF MD in 5-7 days ( we routinely change or add medications that can affect your baseline labs and fluid status, therefore we recommend that you get the mentioned basic workup next visit with your PCP, your PCP may decide not to get them or add new tests based on their clinical decision)   Activity: As tolerated with Full fall precautions use walker/cane & assistance as needed   Disposition ALF     Diet:   DIET SOFT with feeding assistance and aspiration precautions.  For Heart failure patients - Check your Weight same time everyday, if you gain over 2 pounds, or you develop in leg swelling, experience more shortness of breath or chest pain, call your Primary MD immediately. Follow Cardiac Low Salt Diet and 1.5 lit/day fluid restriction.   On your next visit with your primary care physician please Get Medicines reviewed and adjusted.   Please request your Prim.MD to go over all Hospital Tests and Procedure/Radiological results at the follow up, please get all Hospital records sent to your Prim MD by signing hospital release before you go home.   If you experience worsening of your admission symptoms, develop shortness of breath, life threatening emergency, suicidal or homicidal thoughts you must seek medical attention immediately by calling 911 or calling your MD immediately  if symptoms less severe.  You Must read complete instructions/literature along with all the possible adverse reactions/side effects for all the Medicines you take and that have been prescribed to you. Take any new Medicines after you have completely understood and accpet all the possible adverse reactions/side effects.   Do not drive, operate heavy machinery, perform activities at heights, swimming or participation in water activities or provide baby sitting services if your were admitted for syncope or siezures until you have  seen by Primary MD or a Neurologist and advised to do so again.  Do not drive when taking Pain medications.    Do not take more than prescribed Pain, Sleep and Anxiety Medications  Special Instructions: If you have smoked or chewed Tobacco  in the last 2 yrs please stop smoking, stop any regular Alcohol  and or any Recreational drug use.  Wear Seat belts while driving.   Please note  You were cared for by a hospitalist during your hospital stay. If you have any questions about your discharge medications or the care you received while you were in the hospital after you are discharged, you can call the unit and asked to speak with the hospitalist on call if the hospitalist that took care of you is not available. Once you are discharged, your primary care physician will handle any further medical issues. Please note that NO REFILLS for any discharge medications will be authorized once you are discharged, as it is imperative that you return to your primary care physician (or establish a relationship with a primary care physician if you do not have one) for your aftercare needs so that they can reassess your need for medications and monitor your lab values.

## 2016-11-07 ENCOUNTER — Encounter (HOSPITAL_COMMUNITY): Payer: Self-pay

## 2016-11-07 ENCOUNTER — Emergency Department (HOSPITAL_COMMUNITY)
Admission: EM | Admit: 2016-11-07 | Discharge: 2016-11-07 | Disposition: A | Discharge status: Home / Self Care | Payer: Medicare Other | Attending: Emergency Medicine | Admitting: Emergency Medicine

## 2016-11-07 ENCOUNTER — Emergency Department (HOSPITAL_COMMUNITY): Payer: Medicare Other

## 2016-11-07 DIAGNOSIS — I10 Essential (primary) hypertension: Secondary | ICD-10-CM | POA: Insufficient documentation

## 2016-11-07 DIAGNOSIS — F0390 Unspecified dementia without behavioral disturbance: Secondary | ICD-10-CM | POA: Insufficient documentation

## 2016-11-07 DIAGNOSIS — R600 Localized edema: Secondary | ICD-10-CM | POA: Insufficient documentation

## 2016-11-07 DIAGNOSIS — E1165 Type 2 diabetes mellitus with hyperglycemia: Secondary | ICD-10-CM | POA: Diagnosis not present

## 2016-11-07 DIAGNOSIS — M7989 Other specified soft tissue disorders: Secondary | ICD-10-CM | POA: Diagnosis present

## 2016-11-07 DIAGNOSIS — R739 Hyperglycemia, unspecified: Secondary | ICD-10-CM

## 2016-11-07 LAB — DIFFERENTIAL
BASOS ABS: 0 10*3/uL (ref 0.0–0.1)
BASOS PCT: 0 %
Eosinophils Absolute: 0 10*3/uL (ref 0.0–0.7)
Eosinophils Relative: 0 %
Lymphocytes Relative: 18 %
Lymphs Abs: 1.8 10*3/uL (ref 0.7–4.0)
Monocytes Absolute: 1 10*3/uL (ref 0.1–1.0)
Monocytes Relative: 10 %
NEUTROS ABS: 7 10*3/uL (ref 1.7–7.7)
Neutrophils Relative %: 72 %

## 2016-11-07 LAB — CBC
HEMATOCRIT: 32.6 % — AB (ref 36.0–46.0)
Hemoglobin: 10.8 g/dL — ABNORMAL LOW (ref 12.0–15.0)
MCH: 26.9 pg (ref 26.0–34.0)
MCHC: 33.1 g/dL (ref 30.0–36.0)
MCV: 81.3 fL (ref 78.0–100.0)
PLATELETS: 331 10*3/uL (ref 150–400)
RBC: 4.01 MIL/uL (ref 3.87–5.11)
RDW: 13.9 % (ref 11.5–15.5)
WBC: 9.8 10*3/uL (ref 4.0–10.5)

## 2016-11-07 LAB — URINALYSIS, ROUTINE W REFLEX MICROSCOPIC
BILIRUBIN URINE: NEGATIVE
Ketones, ur: 5 mg/dL — AB
LEUKOCYTES UA: NEGATIVE
NITRITE: NEGATIVE
Protein, ur: NEGATIVE mg/dL
SPECIFIC GRAVITY, URINE: 1.01 (ref 1.005–1.030)
pH: 9 — ABNORMAL HIGH (ref 5.0–8.0)

## 2016-11-07 LAB — COMPREHENSIVE METABOLIC PANEL
ALBUMIN: 2.5 g/dL — AB (ref 3.5–5.0)
ALT: 15 U/L (ref 14–54)
AST: 32 U/L (ref 15–41)
Alkaline Phosphatase: 86 U/L (ref 38–126)
Anion gap: 11 (ref 5–15)
BUN: 14 mg/dL (ref 6–20)
CHLORIDE: 100 mmol/L — AB (ref 101–111)
CO2: 27 mmol/L (ref 22–32)
CREATININE: 0.57 mg/dL (ref 0.44–1.00)
Calcium: 8.5 mg/dL — ABNORMAL LOW (ref 8.9–10.3)
GFR calc Af Amer: >60 mL/min (ref >60)
GFR calc non Af Amer: >60 mL/min (ref >60)
Glucose, Bld: 284 mg/dL — ABNORMAL HIGH (ref 65–99)
POTASSIUM: 3.5 mmol/L (ref 3.5–5.1)
SODIUM: 138 mmol/L (ref 135–145)
Total Bilirubin: 0.6 mg/dL (ref 0.3–1.2)
Total Protein: 6.3 g/dL — ABNORMAL LOW (ref 6.5–8.1)

## 2016-11-07 LAB — LIPASE, BLOOD: Lipase: 28 U/L (ref 11–51)

## 2016-11-07 LAB — PROTIME-INR
INR: 1.07
PROTHROMBIN TIME: 14 s (ref 11.4–15.2)

## 2016-11-07 LAB — I-STAT TROPONIN, ED: Troponin i, poc: 0.02 ng/mL (ref 0.00–0.08)

## 2016-11-07 LAB — ETHANOL

## 2016-11-07 MED ORDER — SODIUM CHLORIDE 0.9 % IV BOLUS (SEPSIS)
500.0000 mL | Freq: Once | INTRAVENOUS | Status: AC
  Administered 2016-11-07: 500 mL via INTRAVENOUS

## 2016-11-07 MED ORDER — LORAZEPAM 2 MG/ML IJ SOLN
0.5000 mg | Freq: Once | INTRAMUSCULAR | Status: AC
  Administered 2016-11-07: 0.5 mg via INTRAVENOUS
  Filled 2016-11-07: qty 1

## 2016-11-07 NOTE — ED Notes (Signed)
New Allevyn dressing applied to sacrum.  Peri care provided also.

## 2016-11-07 NOTE — Progress Notes (Signed)
Patient listed as having [employer] insurance without a pcp.  Per chart review, patient is from a ALF.  Patient's pcp no listed on facility paperwork.  EDCM went to speak to patient at bedside, however patient was asleep and female visitor was on the phone.

## 2016-11-07 NOTE — Discharge Instructions (Signed)
Follow up with her doctor at the nursing facility, consider having a neurology evaluation, monitor for worsening symptoms

## 2016-11-07 NOTE — ED Notes (Signed)
RN contacted Ashlee Barron Ashlee Barron who tells RN that Pt will have to come back [facility] as their transport cannot handle getting her home.  [facility] notified.

## 2016-11-07 NOTE — ED Notes (Signed)
Foley catheter drainage bag replaced

## 2016-11-07 NOTE — ED Triage Notes (Addendum)
Per EMS, Pt, from [facility], c/o bilateral hand swelling and subjective fever x 1 week.  Pt was discharged from [facility] x 1 week ago after being admitted for delirium and UTI.  Hx of dementia, HTN, and DM.  Per EMS, facility has been administering acetaminophen.

## 2016-11-07 NOTE — ED Provider Notes (Signed)
[facility]-EMERGENCY DEPT Provider Note   CSN: [id number] Arrival date & time: 11/07/16  1332     History   Chief Complaint Chief Complaint  Patient presents with  . Hand Swelling  . Fever    HPI Ashlee Barron is a 82 y.o. female.  HPI Pt presents from a nursing facility for complaints of hand swelling and subjective fever for the past week.  Pt was in the hospital recently after being admitted on 12/11.  She was discharged 4 days ago from [facility] hospital.  She was diagnosed with a UTI and delirium.  No family members are present in the ED today.  Hx is provided by the patient and EMS report.  Pt is confused and difficult to understand.   Hx is limited for this reason. Past Medical History:  Diagnosis Date  . Constipation   . Dementia   . Diabetes mellitus without complication (HCC)   . Hypertension     Patient Active Problem List   Diagnosis Date Noted  . Palliative care encounter   . Goals of care, counseling/discussion   . Hypoalbuminemia 11/01/2016  . Edema of left foot 11/01/2016  . Acute cystitis without hematuria   . Sepsis secondary to UTI (HCC) 10/31/2016  . Type 2 diabetes mellitus (HCC) 10/31/2016  . Dementia with behavioral disturbance 10/31/2016  . Essential hypertension 10/31/2016  . Aspiration pneumonia (HCC) 10/31/2016  . Abnormal EKG 10/31/2016  . Altered mental status 10/19/2016  . Constipation 09/09/2016  . Dehydration 09/09/2016  . Diabetes (HCC) 09/09/2016    Past Surgical History:  Procedure Laterality Date  . ABDOMINAL HYSTERECTOMY    . APPENDECTOMY    . CHOLECYSTECTOMY      OB History    No data available       Home Medications    Prior to Admission medications   Medication Sig Start Date End Date Taking? Authorizing Provider  acetaminophen (TYLENOL) 500 MG tablet Take 500 mg by mouth 3 (three) times daily as needed for mild pain, fever or headache.    Yes Historical Provider, MD  ALPRAZolam (XANAX) 0.25 MG tablet Take 0.25  mg by mouth daily.    Yes Historical Provider, MD  ALPRAZolam Prudy Feeler(XANAX) 1 MG tablet Take 1 mg by mouth at bedtime.   Yes Historical Provider, MD  divalproex (DEPAKOTE) 250 MG DR tablet Take 250 mg by mouth 2 (two) times daily.   Yes Historical Provider, MD  donepezil (ARICEPT) 10 MG tablet Take 10 mg by mouth at bedtime.   Yes Historical Provider, MD  ferrous sulfate 325 (65 FE) MG tablet Take 325 mg by mouth daily with breakfast.   Yes Historical Provider, MD  haloperidol (HALDOL) 1 MG tablet Take 1 tablet (1 mg total) by mouth daily as needed for agitation. 09/11/16  Yes Pearson GrippeJames Kim, MD  traMADol (ULTRAM) 50 MG tablet Take 50 mg by mouth every 12 (twelve) hours as needed for moderate pain or severe pain.    Yes Historical Provider, MD    Family History Family History  Problem Relation Age of Onset  . Family history unknown: Yes    Social History Social History  Substance Use Topics  . Smoking status: Never Smoker  . Smokeless tobacco: Never Used  . Alcohol use No     Allergies   Patient has no known allergies.   Review of Systems Review of Systems  Unable to perform ROS: Dementia (pt is confused and mumbling)     Physical Exam Updated Vital  Signs BP 150/89   Pulse 104   Temp 99.9 F (37.7 C) (Rectal)   Resp 19   Ht 5\' 5"  (1.651 m)   Wt 55.8 kg   SpO2 99%   BMI 20.47 kg/m   Physical Exam  Constitutional: No distress.  Mildly agitated, mumbling,    HENT:  Head: Normocephalic and atraumatic.  Right Ear: External ear normal.  Left Ear: External ear normal.  Mm dry   Eyes: Conjunctivae are normal. Right eye exhibits no discharge. Left eye exhibits no discharge. No scleral icterus.  Neck: Neck supple. No tracheal deviation present.  Cardiovascular: Normal rate, regular rhythm and intact distal pulses.   Pulmonary/Chest: Effort normal and breath sounds normal. No stridor. No respiratory distress. She has no wheezes. She has no rales.  Abdominal: Soft. Bowel sounds  are normal. She exhibits no distension. There is no tenderness. There is no rebound and no guarding.  Genitourinary:  Genitourinary Comments: Small less than 1 cm stage 2 decubitus ulcer on sacrum, no surrounding erythema  Musculoskeletal: She exhibits edema. She exhibits no tenderness.  Edema noted left greater than right upper extermity, greater in the hands, no erythema, no ttp, no lymphangitis, bruising/trauma noted from prior needle sticks, IVs  Neurological: She is alert. No cranial nerve deficit (no facial droop, extraocular movements intact, no slurred speech) or sensory deficit. She exhibits normal muscle tone. She displays no seizure activity. Coordination normal. GCS eye subscore is 4. GCS verbal subscore is 3. GCS motor subscore is 6.  General weakness, repetitive and nonsensical speech, mumbling, will lift arms and legs off the bed slightly when asked,   Skin: Skin is warm and dry. No rash noted. No erythema.  Psychiatric: She has a normal mood and affect.  Nursing note and vitals reviewed.    ED Treatments / Results  Labs (all labs ordered are listed, but only abnormal results are displayed) Labs Reviewed  CBC - Abnormal; Notable for the following:       Result Value   Hemoglobin 10.8 (*)    HCT 32.6 (*)    All other components within normal limits  COMPREHENSIVE METABOLIC PANEL - Abnormal; Notable for the following:    Chloride 100 (*)    Glucose, Bld 284 (*)    Calcium 8.5 (*)    Total Protein 6.3 (*)    Albumin 2.5 (*)    All other components within normal limits  URINALYSIS, ROUTINE W REFLEX MICROSCOPIC - Abnormal; Notable for the following:    Color, Urine STRAW (*)    pH 9.0 (*)    Glucose, UA >=500 (*)    Hgb urine dipstick SMALL (*)    Ketones, ur 5 (*)    Bacteria, UA RARE (*)    Squamous Epithelial / LPF 0-5 (*)    All other components within normal limits  ETHANOL  PROTIME-INR  DIFFERENTIAL  LIPASE, BLOOD  I-STAT TROPOININ, ED    EKG  EKG  Interpretation  Date/Time:  Monday November 07 2016 14:40:40 EST Ventricular Rate:  110 PR Interval:    QRS Duration: 64 QT Interval:  306 QTC Calculation: 414 R Axis:   50 Text Interpretation:  Sinus tachycardia Prominent P waves, nondiagnostic Abnormal R-wave progression, early transition Probable anterolateral infarct, old No significant change since last tracing Confirmed by Jessie Schrieber  MD-J, Mihira Tozzi ([id number]) on 11/07/2016 2:46:35 PM       Radiology Dg Chest 1 View  Result Date: 11/07/2016 CLINICAL DATA:  Bilateral hand swelling and subjective  fever for 1 week EXAM: CHEST 1 VIEW COMPARISON:  11/03/2016, 12/18/2013, 10/31/2016 FINDINGS: Portable semi-erect view chest demonstrates stable elevation of the right diaphragm. Linear atelectasis or scar right mid lung zone. Improved aeration of left lung base. No acute consolidation or effusion. Stable cardiomediastinal silhouette allowing for portable technique. No pneumothorax. Advanced degenerative changes of the shoulders. IMPRESSION: 1. No acute infiltrate or edema. Electronically Signed   By: Jasmine Pang M.D.   On: 11/07/2016 14:58   Ct Head Wo Contrast  Result Date: 11/07/2016 CLINICAL DATA:  Confusion and agitation EXAM: CT HEAD WITHOUT CONTRAST TECHNIQUE: Contiguous axial images were obtained from the base of the skull through the vertex without intravenous contrast. COMPARISON:  CT head 10/31/2016 FINDINGS: Brain: Moderate atrophy. Moderate chronic microvascular ischemic change in the white matter. Negative for acute infarct. Negative for hemorrhage or mass. Vascular: No hyperdense vessel or unexpected calcification. Skull: Negative Sinuses/Orbits: Negative Other: None IMPRESSION: No acute abnormality and no change from the recent study. Electronically Signed   By: Marlan Palau M.D.   On: 11/07/2016 15:08    Procedures Procedures (including critical care time)  Medications Ordered in ED Medications  sodium chloride 0.9 % bolus 500 mL  (0 mLs Intravenous Stopped 11/07/16 1622)  LORazepam (ATIVAN) injection 0.5 mg (0.5 mg Intravenous Given 11/07/16 1619)     Initial Impression / Assessment and Plan / ED Course  I have reviewed the triage vital signs and the nursing notes.  Pertinent labs & imaging results that were available during my care of the patient were reviewed by me and considered in my medical decision making (see chart for details).  Clinical Course as of Nov 07 1758  Mon Nov 07, 2016  1521 Tachypnea noted.  ?related to agitation.  No hypoxia.  Lungs are clear.  Pulm workup is pending.  Will try a dose of ativan  [JK]  1606 Anemia is stable.  WBC is normal.  [JK]  1609 CXR and head CT negative  [JK]    Clinical Course User Index [JK] Linwood Dibbles, MD   Reviewed tests results with family.  NO acute findings noted on todays evaluation.  Fever reported at the facility but no sign of infection in the ED.  No pneumonia on x-ray. No evidence urinary tract infection. No fever and white blood cell count ?viral illness.     Edema in her hands is most likely related to the fluid infusions she was given while she was in the hospital associated with IVs and blood draws. No evidence to suggest infection or venous thrombosis.  Patient is hyperglycemic but I do not think this would account for any of her mental status issues. Suspect her symptoms are related to dementia. Tachypnea earlier think was related to agitation. She was given a dose of Ativan her respiratory rate decreased.  I think the patient appears stable to follow up at the nursing facility.   Final Clinical Impressions(s) / ED Diagnoses   Final diagnoses:  Edema of hand  Dementia without behavioral disturbance, unspecified dementia type  Hyperglycemia    New Prescriptions New Prescriptions   No medications on file     Linwood Dibbles, MD 11/07/16 1759

## 2016-11-07 NOTE — ED Notes (Signed)
RN contacted [facility] for pickup at [phone].

## 2016-12-18 ENCOUNTER — Inpatient Hospital Stay (HOSPITAL_COMMUNITY)
Admission: EM | Admit: 2016-12-18 | Discharge: 2016-12-28 | DRG: 871 | Disposition: A | Discharge status: Skilled Nursing Facility | Payer: Medicare Other | Attending: Internal Medicine | Admitting: Internal Medicine

## 2016-12-18 ENCOUNTER — Encounter (HOSPITAL_COMMUNITY): Payer: Self-pay | Admitting: *Deleted

## 2016-12-18 ENCOUNTER — Emergency Department (HOSPITAL_COMMUNITY): Payer: Medicare Other

## 2016-12-18 DIAGNOSIS — L89153 Pressure ulcer of sacral region, stage 3: Secondary | ICD-10-CM | POA: Diagnosis present

## 2016-12-18 DIAGNOSIS — G9341 Metabolic encephalopathy: Secondary | ICD-10-CM | POA: Diagnosis present

## 2016-12-18 DIAGNOSIS — J690 Pneumonitis due to inhalation of food and vomit: Secondary | ICD-10-CM | POA: Diagnosis present

## 2016-12-18 DIAGNOSIS — R319 Hematuria, unspecified: Secondary | ICD-10-CM | POA: Diagnosis present

## 2016-12-18 DIAGNOSIS — L8990 Pressure ulcer of unspecified site, unspecified stage: Secondary | ICD-10-CM | POA: Insufficient documentation

## 2016-12-18 DIAGNOSIS — D649 Anemia, unspecified: Secondary | ICD-10-CM | POA: Diagnosis present

## 2016-12-18 DIAGNOSIS — J9601 Acute respiratory failure with hypoxia: Secondary | ICD-10-CM | POA: Diagnosis present

## 2016-12-18 DIAGNOSIS — Y95 Nosocomial condition: Secondary | ICD-10-CM | POA: Diagnosis present

## 2016-12-18 DIAGNOSIS — Z515 Encounter for palliative care: Secondary | ICD-10-CM | POA: Diagnosis present

## 2016-12-18 DIAGNOSIS — F0390 Unspecified dementia without behavioral disturbance: Secondary | ICD-10-CM | POA: Diagnosis present

## 2016-12-18 DIAGNOSIS — I10 Essential (primary) hypertension: Secondary | ICD-10-CM | POA: Diagnosis present

## 2016-12-18 DIAGNOSIS — Z1612 Extended spectrum beta lactamase (ESBL) resistance: Secondary | ICD-10-CM | POA: Diagnosis present

## 2016-12-18 DIAGNOSIS — B9620 Unspecified Escherichia coli [E. coli] as the cause of diseases classified elsewhere: Secondary | ICD-10-CM | POA: Diagnosis present

## 2016-12-18 DIAGNOSIS — J9600 Acute respiratory failure, unspecified whether with hypoxia or hypercapnia: Secondary | ICD-10-CM | POA: Diagnosis present

## 2016-12-18 DIAGNOSIS — G9340 Encephalopathy, unspecified: Secondary | ICD-10-CM

## 2016-12-18 DIAGNOSIS — J189 Pneumonia, unspecified organism: Secondary | ICD-10-CM

## 2016-12-18 DIAGNOSIS — A419 Sepsis, unspecified organism: Secondary | ICD-10-CM | POA: Diagnosis not present

## 2016-12-18 DIAGNOSIS — Z79899 Other long term (current) drug therapy: Secondary | ICD-10-CM

## 2016-12-18 DIAGNOSIS — R7881 Bacteremia: Secondary | ICD-10-CM

## 2016-12-18 DIAGNOSIS — N390 Urinary tract infection, site not specified: Secondary | ICD-10-CM | POA: Diagnosis present

## 2016-12-18 DIAGNOSIS — Z7401 Bed confinement status: Secondary | ICD-10-CM

## 2016-12-18 DIAGNOSIS — R0602 Shortness of breath: Secondary | ICD-10-CM | POA: Diagnosis not present

## 2016-12-18 DIAGNOSIS — E1165 Type 2 diabetes mellitus with hyperglycemia: Secondary | ICD-10-CM | POA: Diagnosis present

## 2016-12-18 DIAGNOSIS — E870 Hyperosmolality and hypernatremia: Secondary | ICD-10-CM | POA: Diagnosis present

## 2016-12-18 DIAGNOSIS — Z66 Do not resuscitate: Secondary | ICD-10-CM | POA: Diagnosis present

## 2016-12-18 DIAGNOSIS — R509 Fever, unspecified: Secondary | ICD-10-CM

## 2016-12-18 DIAGNOSIS — B9689 Other specified bacterial agents as the cause of diseases classified elsewhere: Secondary | ICD-10-CM | POA: Diagnosis present

## 2016-12-18 DIAGNOSIS — D696 Thrombocytopenia, unspecified: Secondary | ICD-10-CM | POA: Diagnosis present

## 2016-12-18 DIAGNOSIS — I471 Supraventricular tachycardia: Secondary | ICD-10-CM | POA: Diagnosis present

## 2016-12-18 DIAGNOSIS — E860 Dehydration: Secondary | ICD-10-CM | POA: Diagnosis present

## 2016-12-18 DIAGNOSIS — E876 Hypokalemia: Secondary | ICD-10-CM | POA: Diagnosis present

## 2016-12-18 NOTE — ED Triage Notes (Signed)
Pt is a resident of [facility] who started running a fever, generalized weakness tonight, dark urine that started yesterday per nursing staff report to ems,

## 2016-12-18 NOTE — ED Provider Notes (Signed)
[facility]-EMERGENCY DEPT Provider Note   CSN: [id number] Arrival date & time: 12/18/16  2317    By signing my name below, I, Valentino Saxon, attest that this documentation has been prepared under the direction and in the presence of Glynn Octave, MD. Electronically Signed: Valentino Saxon, ED Scribe. 12/18/16. 12:05 AM.  History   Chief Complaint Chief Complaint  Patient presents with  . Weakness  . Fever   LEVEL 5 CAVEAT: HPI and ROS limited due to dementia.  HPI  HPI Comments: Ashlee Barron is a 83 y.o. female with PMHx of dementia, Type II DM, HTN brought in by ambulance from [facility] nursing home presents to the Emergency Department with moderate, constant, SOB onset yesterday at ~5:30pm. Per EMS, nursing home reports associated low grade fever followed by generalized weakness. The nursing home reported a Tmax fever of 103 that began at ~3:30pm this evening. Per EMS, pt was administered tylenol with no significant relief. Pt's temperature in the ED today was 100.8. Pt is currently on 2 liters of oxygen at all times. Pt's emergency contact is listed as [name] and can be reached at [phone].  Past Medical History:  Diagnosis Date  . Constipation   . Dementia   . Diabetes mellitus without complication (HCC)   . Hypertension     Patient Active Problem List   Diagnosis Date Noted  . Palliative care encounter   . Goals of care, counseling/discussion   . Hypoalbuminemia 11/01/2016  . Edema of left foot 11/01/2016  . Acute cystitis without hematuria   . Sepsis secondary to UTI (HCC) 10/31/2016  . Type 2 diabetes mellitus (HCC) 10/31/2016  . Dementia with behavioral disturbance 10/31/2016  . Essential hypertension 10/31/2016  . Aspiration pneumonia (HCC) 10/31/2016  . Abnormal EKG 10/31/2016  . Altered mental status 10/19/2016  . Constipation 09/09/2016  . Dehydration 09/09/2016  . Diabetes (HCC) 09/09/2016    Past Surgical History:  Procedure Laterality  Date  . ABDOMINAL HYSTERECTOMY    . APPENDECTOMY    . CHOLECYSTECTOMY      OB History    No data available       Home Medications    Prior to Admission medications   Medication Sig Start Date End Date Taking? Authorizing Provider  acetaminophen (TYLENOL) 500 MG tablet Take 500 mg by mouth 3 (three) times daily as needed for mild pain, fever or headache.    Yes Historical Provider, MD  ALPRAZolam (XANAX) 0.25 MG tablet Take 0.25 mg by mouth daily.    Yes Historical Provider, MD  ALPRAZolam Prudy Feeler) 1 MG tablet Take 1 mg by mouth at bedtime.   Yes Historical Provider, MD  bisacodyl (DULCOLAX) 10 MG suppository Place 10 mg rectally daily as needed for moderate constipation.   Yes Historical Provider, MD  divalproex (DEPAKOTE) 250 MG DR tablet Take 250 mg by mouth 2 (two) times daily.   Yes Historical Provider, MD  donepezil (ARICEPT) 10 MG tablet Take 10 mg by mouth at bedtime.   Yes Historical Provider, MD  ferrous sulfate 325 (65 FE) MG tablet Take 325 mg by mouth daily with breakfast.   Yes Historical Provider, MD  haloperidol (HALDOL) 1 MG tablet Take 1 tablet (1 mg total) by mouth daily as needed for agitation. 09/11/16  Yes Pearson Grippe, MD  linaclotide (LINZESS) 290 MCG CAPS capsule Take 290 mcg by mouth daily before breakfast.   Yes Historical Provider, MD  QUEtiapine (SEROQUEL) 50 MG tablet Take 50 mg by mouth at  bedtime.   Yes Historical Provider, MD  traMADol (ULTRAM) 50 MG tablet Take 50 mg by mouth every 12 (twelve) hours as needed for moderate pain or severe pain.    Yes Historical Provider, MD    Family History Family History  Problem Relation Age of Onset  . Family history unknown: Yes    Social History Social History  Substance Use Topics  . Smoking status: Never Smoker  . Smokeless tobacco: Never Used  . Alcohol use No     Allergies   Patient has no known allergies.   Review of Systems Review of Systems  Unable to perform ROS: Dementia  Constitutional:  Positive for fever.  Respiratory: Positive for shortness of breath.   Neurological: Positive for weakness.   Physical Exam Updated Vital Signs BP 104/59   Pulse 116   Temp 100.8 F (38.2 C) (Rectal)   Resp (!) 36   Ht 5\' 4"  (1.626 m)   Wt 120 lb (54.4 kg)   SpO2 93%   BMI 20.60 kg/m   Physical Exam  Constitutional: She appears well-developed and well-nourished. She appears distressed.  Increased RR, shallow respirations  HENT:  Head: Normocephalic and atraumatic.  Mouth/Throat: Oropharynx is clear and moist. No oropharyngeal exudate.  Eyes: Conjunctivae and EOM are normal. Pupils are equal, round, and reactive to light.  Neck: Normal range of motion. Neck supple.  No meningismus.  Cardiovascular: Intact distal pulses.   No murmur heard. Tachypnea.   Pulmonary/Chest: Effort normal.  Decreased breathe sounds on right.   Abdominal: Soft. There is no tenderness. There is no rebound and no guarding.  Genitourinary:  Genitourinary Comments: Open ulcer over sacrum. Foul smelling fluid. 3 x 8cm. Indwelling foley with dark urine.   Musculoskeletal: Normal range of motion. She exhibits no edema or tenderness.  Neurological: No cranial nerve deficit. She exhibits normal muscle tone. Coordination normal.  Nonverbal. Moves all extremities. Doesn't follow commands.   Skin: Skin is warm.  Black eschar back of left heel. Abrasion to left knee.  Intact DP pulses  Psychiatric: She has a normal mood and affect. Her behavior is normal.  Nursing note and vitals reviewed.    ED Treatments / Results   DIAGNOSTIC STUDIES: Oxygen Saturation is 93% on [place], low by my interpretation.    COORDINATION OF CARE: 11:34 PM Discussed treatment plan with pt at bedside which includes labs, chest imaging and EKG and pt agreed to plan.   Labs (all labs ordered are listed, but only abnormal results are displayed) Labs Reviewed  CBC WITH DIFFERENTIAL/PLATELET - Abnormal; Notable for the following:        Result Value   WBC 12.8 (*)    Hemoglobin 10.9 (*)    MCH 25.4 (*)    MCHC 29.9 (*)    RDW 16.7 (*)    Neutro Abs 11.5 (*)    All other components within normal limits  COMPREHENSIVE METABOLIC PANEL - Abnormal; Notable for the following:    Sodium 161 (*)    Potassium 3.2 (*)    Chloride 121 (*)    Glucose, Bld 472 (*)    BUN 38 (*)    Calcium 8.5 (*)    Albumin 2.0 (*)    ALT 10 (*)    All other components within normal limits  URINALYSIS, ROUTINE W REFLEX MICROSCOPIC - Abnormal; Notable for the following:    APPearance TURBID (*)    Glucose, UA >=500 (*)    Hgb urine dipstick MODERATE (*)  Protein, ur 100 (*)    Leukocytes, UA MODERATE (*)    Bacteria, UA MANY (*)    All other components within normal limits  VALPROIC ACID LEVEL - Abnormal; Notable for the following:    Valproic Acid Lvl 17 (*)    All other components within normal limits  I-STAT CG4 LACTIC ACID, ED - Abnormal; Notable for the following:    Lactic Acid, Venous 3.38 (*)    All other components within normal limits  CULTURE, BLOOD (ROUTINE X 2)  CULTURE, BLOOD (ROUTINE X 2)  URINE CULTURE  INFLUENZA PANEL BY PCR (TYPE A & B)  BASIC METABOLIC PANEL  I-STAT CG4 LACTIC ACID, ED    EKG  EKG Interpretation  Date/Time:  Sunday December 18 2016 23:28:33 EST Ventricular Rate:  114 PR Interval:    QRS Duration: 69 QT Interval:  309 QTC Calculation: 426 R Axis:   25 Text Interpretation:  Sinus tachycardia Probable anterior infarct, age indeterminate Artifact Confirmed by Manus Gunning  MD, Paislie Tessler [id number]) on 12/18/2016 11:42:07 PM       Radiology Dg Chest Portable 1 View  Result Date: 12/19/2016 CLINICAL DATA:  Aortic atherosclerosis. EXAM: PORTABLE CHEST 1 VIEW COMPARISON:  11/07/2016 FINDINGS: Lungs are hypoinflated with heterogeneous airspace opacification over the right mid to lower lung and left base likely pneumonia. No evidence of effusion. Cardiomediastinal silhouette is within normal. There is  minimal calcified plaque over the aortic arch. There are mild degenerative changes of the spine. IMPRESSION: Multifocal airspace process over the mid to lower lungs right worse than left likely pneumonia. Electronically Signed   By: Elberta Fortis M.D.   On: 12/19/2016 00:06    Procedures Procedures (including critical care time)  Medications Ordered in ED Medications - No data to display   Initial Impression / Assessment and Plan / ED Course  I have reviewed the triage vital signs and the nursing notes.  Pertinent labs & imaging results that were available during my care of the patient were reviewed by me and considered in my medical decision making (see chart for details).     Patient from nursing home with altered mental status, fever, shortness of breath and dark urine. She is demented and nonverbal at baseline. DO NOT RESUSCITATE in place.  Patient febrile with tachycardia and shallow tachypnea. She meets sepsis criteria. Labs, cultures, urine obtained. Code sepsis activated.  Broad-spectrum antibiotic started. X-ray shows pneumonia as well as urinary tract infection and infected decubitus ulcer. Sodium 160. Hyperglycemia without DKA. IVF and broad spectrum antibiotics given.  Daughter at bedside confirms DNR/DNI.  Plan admission for sepsis with HCAP, UTI, hypernatremia D/w Dr. Onalee Hua.  CRITICAL CARE Performed by: Glynn Octave Total critical care time: 45 minutes Critical care time was exclusive of separately billable procedures and treating other patients. Critical care was necessary to treat or prevent imminent or life-threatening deterioration. Critical care was time spent personally by me on the following activities: development of treatment plan with patient and/or surrogate as well as nursing, discussions with consultants, evaluation of patient's response to treatment, examination of patient, obtaining history from patient or surrogate, ordering and performing treatments  and interventions, ordering and review of laboratory studies, ordering and review of radiographic studies, pulse oximetry and re-evaluation of patient's condition.   Final Clinical Impressions(s) / ED Diagnoses   Final diagnoses:  Sepsis, due to unspecified organism (HCC)  Hypernatremia  HCAP (healthcare-associated pneumonia)  Urinary tract infection with hematuria, site unspecified    New Prescriptions New Prescriptions  No medications on file    I personally performed the services described in this documentation, which was scribed in my presence. The recorded information has been reviewed and is accurate.     Glynn Octave, MD 12/19/16 [phone]

## 2016-12-18 NOTE — ED Notes (Addendum)
Pt has stage 3 ulcer to buttock region with strong odor noted, un stageable area to left heel with black eschar, abrasion to inside of left knee,

## 2016-12-19 ENCOUNTER — Encounter (HOSPITAL_COMMUNITY): Payer: Self-pay | Admitting: Primary Care

## 2016-12-19 ENCOUNTER — Inpatient Hospital Stay (HOSPITAL_COMMUNITY): Payer: Medicare Other

## 2016-12-19 DIAGNOSIS — R319 Hematuria, unspecified: Secondary | ICD-10-CM

## 2016-12-19 DIAGNOSIS — F0390 Unspecified dementia without behavioral disturbance: Secondary | ICD-10-CM

## 2016-12-19 DIAGNOSIS — J189 Pneumonia, unspecified organism: Secondary | ICD-10-CM

## 2016-12-19 DIAGNOSIS — E1165 Type 2 diabetes mellitus with hyperglycemia: Secondary | ICD-10-CM | POA: Diagnosis present

## 2016-12-19 DIAGNOSIS — R0602 Shortness of breath: Secondary | ICD-10-CM | POA: Diagnosis present

## 2016-12-19 DIAGNOSIS — Z515 Encounter for palliative care: Secondary | ICD-10-CM | POA: Diagnosis present

## 2016-12-19 DIAGNOSIS — D696 Thrombocytopenia, unspecified: Secondary | ICD-10-CM | POA: Diagnosis present

## 2016-12-19 DIAGNOSIS — E860 Dehydration: Secondary | ICD-10-CM | POA: Diagnosis present

## 2016-12-19 DIAGNOSIS — Z7189 Other specified counseling: Secondary | ICD-10-CM | POA: Diagnosis not present

## 2016-12-19 DIAGNOSIS — E870 Hyperosmolality and hypernatremia: Secondary | ICD-10-CM | POA: Diagnosis present

## 2016-12-19 DIAGNOSIS — Z79899 Other long term (current) drug therapy: Secondary | ICD-10-CM | POA: Diagnosis not present

## 2016-12-19 DIAGNOSIS — L8990 Pressure ulcer of unspecified site, unspecified stage: Secondary | ICD-10-CM | POA: Insufficient documentation

## 2016-12-19 DIAGNOSIS — Z7401 Bed confinement status: Secondary | ICD-10-CM | POA: Diagnosis not present

## 2016-12-19 DIAGNOSIS — J9601 Acute respiratory failure with hypoxia: Secondary | ICD-10-CM | POA: Diagnosis present

## 2016-12-19 DIAGNOSIS — F0391 Unspecified dementia with behavioral disturbance: Secondary | ICD-10-CM | POA: Diagnosis not present

## 2016-12-19 DIAGNOSIS — I471 Supraventricular tachycardia: Secondary | ICD-10-CM | POA: Diagnosis present

## 2016-12-19 DIAGNOSIS — B9620 Unspecified Escherichia coli [E. coli] as the cause of diseases classified elsewhere: Secondary | ICD-10-CM | POA: Diagnosis present

## 2016-12-19 DIAGNOSIS — N390 Urinary tract infection, site not specified: Secondary | ICD-10-CM | POA: Diagnosis present

## 2016-12-19 DIAGNOSIS — L89150 Pressure ulcer of sacral region, unstageable: Secondary | ICD-10-CM | POA: Diagnosis not present

## 2016-12-19 DIAGNOSIS — Z1612 Extended spectrum beta lactamase (ESBL) resistance: Secondary | ICD-10-CM | POA: Diagnosis present

## 2016-12-19 DIAGNOSIS — D649 Anemia, unspecified: Secondary | ICD-10-CM | POA: Diagnosis present

## 2016-12-19 DIAGNOSIS — A4151 Sepsis due to Escherichia coli [E. coli]: Secondary | ICD-10-CM | POA: Diagnosis not present

## 2016-12-19 DIAGNOSIS — L89153 Pressure ulcer of sacral region, stage 3: Secondary | ICD-10-CM | POA: Diagnosis present

## 2016-12-19 DIAGNOSIS — R7881 Bacteremia: Secondary | ICD-10-CM | POA: Diagnosis not present

## 2016-12-19 DIAGNOSIS — J690 Pneumonitis due to inhalation of food and vomit: Secondary | ICD-10-CM | POA: Diagnosis present

## 2016-12-19 DIAGNOSIS — Z66 Do not resuscitate: Secondary | ICD-10-CM | POA: Diagnosis present

## 2016-12-19 DIAGNOSIS — E876 Hypokalemia: Secondary | ICD-10-CM | POA: Diagnosis present

## 2016-12-19 DIAGNOSIS — Y95 Nosocomial condition: Secondary | ICD-10-CM | POA: Diagnosis present

## 2016-12-19 DIAGNOSIS — B9689 Other specified bacterial agents as the cause of diseases classified elsewhere: Secondary | ICD-10-CM | POA: Diagnosis present

## 2016-12-19 DIAGNOSIS — A419 Sepsis, unspecified organism: Secondary | ICD-10-CM | POA: Diagnosis present

## 2016-12-19 DIAGNOSIS — G9341 Metabolic encephalopathy: Secondary | ICD-10-CM | POA: Diagnosis present

## 2016-12-19 DIAGNOSIS — G9340 Encephalopathy, unspecified: Secondary | ICD-10-CM | POA: Diagnosis not present

## 2016-12-19 DIAGNOSIS — I10 Essential (primary) hypertension: Secondary | ICD-10-CM | POA: Diagnosis present

## 2016-12-19 DIAGNOSIS — J9600 Acute respiratory failure, unspecified whether with hypoxia or hypercapnia: Secondary | ICD-10-CM | POA: Diagnosis present

## 2016-12-19 LAB — COMPREHENSIVE METABOLIC PANEL
ALBUMIN: 2 g/dL — AB (ref 3.5–5.0)
ALT: 10 U/L — ABNORMAL LOW (ref 14–54)
ANION GAP: 10 (ref 5–15)
AST: 16 U/L (ref 15–41)
Alkaline Phosphatase: 113 U/L (ref 38–126)
BUN: 38 mg/dL — ABNORMAL HIGH (ref 6–20)
CO2: 30 mmol/L (ref 22–32)
Calcium: 8.5 mg/dL — ABNORMAL LOW (ref 8.9–10.3)
Chloride: 121 mmol/L — ABNORMAL HIGH (ref 101–111)
Creatinine, Ser: 0.81 mg/dL (ref 0.44–1.00)
GFR calc Af Amer: >60 mL/min (ref >60)
GFR calc non Af Amer: >60 mL/min (ref >60)
Glucose, Bld: 472 mg/dL — ABNORMAL HIGH (ref 65–99)
POTASSIUM: 3.2 mmol/L — AB (ref 3.5–5.1)
SODIUM: 161 mmol/L — AB (ref 135–145)
Total Bilirubin: 0.6 mg/dL (ref 0.3–1.2)
Total Protein: 6.5 g/dL (ref 6.5–8.1)

## 2016-12-19 LAB — CBC WITH DIFFERENTIAL/PLATELET
BASOS ABS: 0 10*3/uL (ref 0.0–0.1)
BASOS PCT: 0 %
EOS ABS: 0 10*3/uL (ref 0.0–0.7)
Eosinophils Relative: 0 %
HCT: 36.5 % (ref 36.0–46.0)
HEMOGLOBIN: 10.9 g/dL — AB (ref 12.0–15.0)
Lymphocytes Relative: 8 %
Lymphs Abs: 1.1 10*3/uL (ref 0.7–4.0)
MCH: 25.4 pg — ABNORMAL LOW (ref 26.0–34.0)
MCHC: 29.9 g/dL — AB (ref 30.0–36.0)
MCV: 85.1 fL (ref 78.0–100.0)
MONOS PCT: 1 %
Monocytes Absolute: 0.2 10*3/uL (ref 0.1–1.0)
NEUTROS PCT: 90 %
Neutro Abs: 11.5 10*3/uL — ABNORMAL HIGH (ref 1.7–7.7)
Platelets: 166 10*3/uL (ref 150–400)
RBC: 4.29 MIL/uL (ref 3.87–5.11)
RDW: 16.7 % — ABNORMAL HIGH (ref 11.5–15.5)
WBC: 12.8 10*3/uL — AB (ref 4.0–10.5)

## 2016-12-19 LAB — URINALYSIS, ROUTINE W REFLEX MICROSCOPIC
Bilirubin Urine: NEGATIVE
Glucose, UA: >=500 mg/dL — AB
Ketones, ur: NEGATIVE mg/dL
Nitrite: NEGATIVE
Protein, ur: 100 mg/dL — AB
Specific Gravity, Urine: 1.025 (ref 1.005–1.030)
pH: 5 (ref 5.0–8.0)

## 2016-12-19 LAB — GLUCOSE, CAPILLARY
Glucose-Capillary: 215 mg/dL — ABNORMAL HIGH (ref 65–99)
Glucose-Capillary: 198 mg/dL — ABNORMAL HIGH (ref 65–99)

## 2016-12-19 LAB — VALPROIC ACID LEVEL: VALPROIC ACID LVL: 17 ug/mL — AB (ref 50.0–100.0)

## 2016-12-19 LAB — BASIC METABOLIC PANEL
Anion gap: 12 (ref 5–15)
BUN: 36 mg/dL — ABNORMAL HIGH (ref 6–20)
CHLORIDE: 122 mmol/L — AB (ref 101–111)
CO2: 26 mmol/L (ref 22–32)
Calcium: 8.1 mg/dL — ABNORMAL LOW (ref 8.9–10.3)
Creatinine, Ser: 0.81 mg/dL (ref 0.44–1.00)
GFR calc Af Amer: >60 mL/min (ref >60)
GFR calc non Af Amer: >60 mL/min (ref >60)
Glucose, Bld: 486 mg/dL — ABNORMAL HIGH (ref 65–99)
POTASSIUM: 3 mmol/L — AB (ref 3.5–5.1)
SODIUM: 160 mmol/L — AB (ref 135–145)

## 2016-12-19 LAB — PROCALCITONIN: Procalcitonin: 0.31 ng/mL

## 2016-12-19 LAB — CBG MONITORING, ED
Glucose-Capillary: 394 mg/dL — ABNORMAL HIGH (ref 65–99)
Glucose-Capillary: 411 mg/dL — ABNORMAL HIGH (ref 65–99)

## 2016-12-19 LAB — I-STAT CG4 LACTIC ACID, ED: Lactic Acid, Venous: 3.38 mmol/L (ref 0.5–1.9)

## 2016-12-19 LAB — INFLUENZA PANEL BY PCR (TYPE A & B)
Influenza A By PCR: NEGATIVE
Influenza B By PCR: NEGATIVE

## 2016-12-19 LAB — LACTIC ACID, PLASMA
LACTIC ACID, VENOUS: 7 mmol/L — AB (ref 0.5–1.9)
Lactic Acid, Venous: 1.5 mmol/L (ref 0.5–1.9)

## 2016-12-19 MED ORDER — DAKINS (1/4 STRENGTH) 0.125 % EX SOLN
Freq: Every day | CUTANEOUS | Status: AC
  Administered 2016-12-20 – 2016-12-21 (×3)
  Filled 2016-12-19: qty 473

## 2016-12-19 MED ORDER — SODIUM CHLORIDE 0.9 % IV SOLN
1000.0000 mL | Freq: Once | INTRAVENOUS | Status: AC
  Administered 2016-12-19: 1000 mL via INTRAVENOUS

## 2016-12-19 MED ORDER — VANCOMYCIN HCL IN DEXTROSE 750-5 MG/150ML-% IV SOLN
INTRAVENOUS | Status: AC
  Filled 2016-12-19: qty 150

## 2016-12-19 MED ORDER — ENOXAPARIN SODIUM 40 MG/0.4ML ~~LOC~~ SOLN
40.0000 mg | SUBCUTANEOUS | Status: DC
  Administered 2016-12-20: 40 mg via SUBCUTANEOUS
  Filled 2016-12-19: qty 0.4

## 2016-12-19 MED ORDER — VANCOMYCIN HCL IN DEXTROSE 750-5 MG/150ML-% IV SOLN
750.0000 mg | Freq: Once | INTRAVENOUS | Status: AC
  Administered 2016-12-19: 750 mg via INTRAVENOUS
  Filled 2016-12-19: qty 150

## 2016-12-19 MED ORDER — CHLORHEXIDINE GLUCONATE CLOTH 2 % EX PADS: 6.0000 | MEDICATED_PAD | Freq: Every day | CUTANEOUS | Status: DC

## 2016-12-19 MED ORDER — MUPIROCIN 2 % EX OINT: 1.0000 "application " | TOPICAL_OINTMENT | Freq: Two times a day (BID) | CUTANEOUS | Status: DC

## 2016-12-19 MED ORDER — POTASSIUM CHLORIDE IN NACL 20-0.9 MEQ/L-% IV SOLN
INTRAVENOUS | Status: DC
  Administered 2016-12-19: 22:00:00 via INTRAVENOUS

## 2016-12-19 MED ORDER — VANCOMYCIN HCL 500 MG IV SOLR
500.0000 mg | Freq: Two times a day (BID) | INTRAVENOUS | Status: DC
  Administered 2016-12-20 – 2016-12-21 (×3): 500 mg via INTRAVENOUS
  Filled 2016-12-19 (×5): qty 500

## 2016-12-19 MED ORDER — INSULIN ASPART 100 UNIT/ML ~~LOC~~ SOLN
0.0000 [IU] | SUBCUTANEOUS | Status: DC
  Administered 2016-12-19: 9 [IU] via SUBCUTANEOUS
  Administered 2016-12-20 (×3): 2 [IU] via SUBCUTANEOUS
  Administered 2016-12-21: 1 [IU] via SUBCUTANEOUS
  Administered 2016-12-21: 2 [IU] via SUBCUTANEOUS
  Administered 2016-12-21 (×3): 1 [IU] via SUBCUTANEOUS
  Administered 2016-12-21: 3 [IU] via SUBCUTANEOUS
  Administered 2016-12-22 (×2): 2 [IU] via SUBCUTANEOUS
  Administered 2016-12-22: 1 [IU] via SUBCUTANEOUS
  Filled 2016-12-19: qty 1

## 2016-12-19 MED ORDER — DEXTROSE 5 % IV SOLN
1.0000 g | INTRAVENOUS | Status: DC
  Administered 2016-12-19 – 2016-12-20 (×2): 1 g via INTRAVENOUS
  Filled 2016-12-19 (×2): qty 1

## 2016-12-19 MED ORDER — LEVOFLOXACIN IN D5W 750 MG/150ML IV SOLN
INTRAVENOUS | Status: AC
  Filled 2016-12-19: qty 150

## 2016-12-19 MED ORDER — SODIUM CHLORIDE 0.9 % IV BOLUS (SEPSIS)
1000.0000 mL | Freq: Once | INTRAVENOUS | Status: AC
  Administered 2016-12-19: 1000 mL via INTRAVENOUS

## 2016-12-19 MED ORDER — DEXTROSE 5 % IV SOLN
1.0000 g | Freq: Once | INTRAVENOUS | Status: AC
  Administered 2016-12-19: 1 g via INTRAVENOUS
  Filled 2016-12-19: qty 1

## 2016-12-19 MED ORDER — SODIUM CHLORIDE 0.9 % IV BOLUS (SEPSIS)
500.0000 mL | Freq: Once | INTRAVENOUS | Status: AC
  Administered 2016-12-19: 500 mL via INTRAVENOUS

## 2016-12-19 NOTE — Progress Notes (Signed)
Nutrition Brief Note  Patient identified on the Malnutrition Screening Tool (MST) Report  Patient has advanced dementia. Presents from SNF with fever 103, UTI/HCAP, sepsis. Weight hx shows progressive weight loss over the past 6 months likely related to aging and disease process.  Patient is active with [facility]osp[facility]. Family is ok with her receiving IVF and antibiotic.  Current diet order is NPO. Will follow for possilbe diet advancement.    Recent Labs Lab 12/18/16 2345 12/19/16 0435  NA 161* 160*  K 3.2* 3.0*  CL 121* 122*  CO2 30 26  BUN 38* 36*  CREATININE 0.81 0.81  CALCIUM 8.5* 8.1*  GLUCOSE 472* 486*    Labs and medications reviewed.   If nutrition care goals change, please consult RD.   Royann ShiversLynn Ozetta Flatley MS,RD,CSG,LDN Office: [phone] Pager: [phone]

## 2016-12-19 NOTE — Progress Notes (Signed)
ANTIBIOTIC CONSULT NOTE-Preliminary  Pharmacy Consult for Vancomycin andn Cefepime Indication: Pneumonia  No Known Allergies  Patient Measurements: Height: 5\' 4"  (162.6 cm) Weight: 120 lb (54.4 kg) IBW/kg (Calculated) : 54.7   Vital Signs: Temp: 100.8 F (38.2 C) (01/28 2328) Temp Source: Rectal (01/28 2328) BP: 104/59 (01/28 2328) Pulse Rate: 116 (01/28 2328)  Labs:  Recent Labs  12/18/16 2345  WBC 12.8*  HGB 10.9*  PLT 166  CREATININE 0.81    Estimated Creatinine Clearance: 42 mL/min (by C-G formula based on SCr of 0.81 mg/dL).  No results for input(s): VANCOTROUGH, VANCOPEAK, VANCORANDOM, GENTTROUGH, GENTPEAK, GENTRANDOM, TOBRATROUGH, TOBRAPEAK, TOBRARND, AMIKACINPEAK, AMIKACINTROU, AMIKACIN in the last 72 hours.   Microbiology: Recent Results (from the past 720 hour(s))  Blood culture (routine x 2)     Status: None (Preliminary result)   Collection Time: 12/18/16 11:58 PM  Result Value Ref Range Status   Specimen Description BLOOD RIGHT ARM  Final   Special Requests BOTTLES DRAWN AEROBIC AND ANAEROBIC 5CC EACH  Final   Culture PENDING  Incomplete   Report Status PENDING  Incomplete  Blood culture (routine x 2)     Status: None (Preliminary result)   Collection Time: 12/19/16 12:10 AM  Result Value Ref Range Status   Specimen Description BLOOD RIGHT HAND  Final   Special Requests BOTTLES DRAWN AEROBIC ONLY 5CC ONLY  Final   Culture PENDING  Incomplete   Report Status PENDING  Incomplete    Medical History: Past Medical History:  Diagnosis Date  . Constipation   . Dementia   . Diabetes mellitus without complication (HCC)   . Hypertension     Medications:   Assessment: 83 yo female nursing home resident seen in the ED for constant SOB and fever. Empiric antibiotics for HCAP.  Goal of Therapy:  Vancomycin troughs 15-20 mcg/ml Eradicate infection  Plan:  Preliminary review of pertinent patient information completed.  Protocol will be initiated  with one-time doses of Vancomycin 750 mg IV and Cefepime 1 Gm IV.  Jeani HawkingAnnie Penn clinical pharmacist will complete review during morning rounds to assess patient and finalize treatment regimen.  Arelia SneddonMason, Onis Markoff Anne, [facility]RPH 12/19/2016,1:02 AM

## 2016-12-19 NOTE — Progress Notes (Signed)
Positive blood cultures report called to MD.

## 2016-12-19 NOTE — Consult Note (Signed)
WOC Nurse wound consult note Reason for Consult: Unstageable pressure injury to sacrum, left heel.  Present on admission.  Dry intact eschar to left heel.  100% devitalized tissue to wound bed.  Wound type:unstageable pressure injury Pressure Injury POA: Yes Measurement: Sacrum 4 cm x 6.5 cm x 2 cm 100% slough to wound bed with purulence and foul odor.  Heel 2 cm x 3 cm intact eschar with necrotic odor present.  No drainage or fluctuance noted.  Wound ZHY:QMVHQIONbed:necrotic tissue Drainage (amount, consistency, odor) Moderate purulence to sacral wound.  Patient is DNR with palliative consult in place.  Family wishes to continue with antibiotics and IV fluids.  Will begin Dakin's for debridement to sacral wound.  Albumin 2.0.  Will protect left heel (dry intact eschar) with dry dressing  Periwound: Erythema to sacral [periwound.  Heel is intact Dressing procedure/placement/frequency:Cleanse sacral wound with NS and pat gently dry.  Apply Dakin's moist gauze to wound bed.  Cover with 4x4 gauze and ABD pad.  Change daily.   Cleanse left heel with NS and pat gently dry.  Apply 4x4 gauze and kerlix/tape.  Change daily.  Offload pressure.   Mattress with low air loss feature.   Will not follow at this time.  Please re-consult if needed.  Maple HudsonKaren Nemesis Rainwater RN BSN CWON Pager [phone]

## 2016-12-19 NOTE — Care Management (Signed)
CM received call from Leandro Reasonerharles, Representative [facility]. Patient is active with hospice services.

## 2016-12-19 NOTE — ED Notes (Signed)
Dr. Ardyth HarpsHernandez paged for critical lab value Lactic Acid 7.0.

## 2016-12-19 NOTE — Consult Note (Signed)
Consultation Note Date: 12/19/2016   Patient Name: Ashlee Barron  DOB: [birth year]  MRN: [id number]  Age / Sex: 83 y.o., female  PCP: No Pcp Per Patient Referring Physician: Memory Argue*  Reason for Consultation: Establishing goals of care and Psychosocial/spiritual support  HPI/Patient Profile: 83 y.o. female  with past medical history of Dementia with declines over the last 2 months, diabetes, hypertension, constipation admitted on 12/18/2016 with sepsis.   Clinical Assessment and Goals of Care: Mrs. Ackert is resting quietly in bed. She does not try to communicate with me in any meaningful manner. She does not open her eyes to touch. Present today at bedside are daughter Dulce Sellar, son Shon Hale ferrous, and a female cousin. Shon Hale tells of Mrs. Evans declined over the last 2 months, going from ambulating with assistance to self propelling with wheelchair, to now basically being bedbound. He also states that asked over the last few months she has lost the ability to communicate effectively. We review her labs and her health issues, including multiple wounds.  I share my concern over her sepsis. Family states that Mrs. Osten was with hospice, but they brought her to the hospital because they felt she may have a health concern that was "reversible".  Family seems somewhat surprised that Mrs. Salatino has declined and is near end-of-life.  We talk about prognosis. Hours - Days would not be surprising based on 3 hospitalizations in the last 6 months functional decline over the last 2 months, frailty, sepsis.  Healthcare power of attorney NEXT OF KIN - daughter [name]. But children make decisions as a group.   SUMMARY OF RECOMMENDATIONS   continue to treat the treatable, 24 to 48 hours for outcomes. Family is calling in relatives, concerned that Mrs. Hagarty is at end-of-life.  Code Status/Advance Care  Planning:  DNR  Symptom Management:   per hospitalist  Palliative Prophylaxis:   Aspiration, Palliative Wound Care and Turn Reposition  Additional Recommendations (Limitations, Scope, Preferences):  Continue to treat the treatable but no extraordinary measures such as CPR or intubation.  Psycho-social/Spiritual:   Desire for further Chaplaincy support:no  Additional Recommendations: Caregiving  Support/Resources and Grief/Bereavement Support   Prognosis:  would not be surprising based on 3 hospitalizations in the last 6 months functional decline over the last 2 months, frailty, sepsis.  Hours - Days, Discharge Planning: To be determined, in hospital to death would not be surprising.      Primary Diagnoses: Present on Admission: . Sepsis (HCC) . Acute respiratory failure (HCC) . Hospital acquired PNA . Urinary tract infection with hematuria . Dehydration . Hypernatremia . Dementia   I have reviewed the medical record, interviewed the patient and family, and examined the patient. The following aspects are pertinent.  Past Medical History:  Diagnosis Date  . Constipation   . Dementia   . Diabetes mellitus without complication (HCC)   . Hypertension    Social History   Social History  . Marital status: Widowed    Spouse name: N/A  .  Number of children: N/A  . Years of education: N/A   Social History Main Topics  . Smoking status: Never Smoker  . Smokeless tobacco: Never Used  . Alcohol use No  . Drug use: No  . Sexual activity: Not Asked   Other Topics Concern  . None   Social History Narrative  . None   Family History  Problem Relation Age of Onset  . Family history unknown: Yes   Scheduled Meds: . ceFEPime (MAXIPIME) IV  1 g Intravenous Q24H  . insulin aspart  0-9 Units Subcutaneous Q4H  . sodium hypochlorite   Irrigation Daily  . vancomycin  500 mg Intravenous Q12H   Continuous Infusions: PRN Meds:. Medications Prior to Admission:    Prior to Admission medications   Medication Sig Start Date End Date Taking? Authorizing Provider  acetaminophen (TYLENOL) 500 MG tablet Take 500 mg by mouth 3 (three) times daily as needed for mild pain, fever or headache.    Yes Historical Provider, MD  ALPRAZolam (XANAX) 0.25 MG tablet Take 0.25 mg by mouth daily.    Yes Historical Provider, MD  ALPRAZolam Prudy Feeler) 1 MG tablet Take 1 mg by mouth at bedtime.   Yes Historical Provider, MD  divalproex (DEPAKOTE) 250 MG DR tablet Take 250 mg by mouth 2 (two) times daily.   Yes Historical Provider, MD  donepezil (ARICEPT) 10 MG tablet Take 10 mg by mouth at bedtime.   Yes Historical Provider, MD  ferrous sulfate 325 (65 FE) MG tablet Take 325 mg by mouth daily with breakfast.   Yes Historical Provider, MD  haloperidol (HALDOL) 1 MG tablet Take 1 tablet (1 mg total) by mouth daily as needed for agitation. 09/11/16  Yes Pearson Grippe, MD  linaclotide [facility]) 290 MCG CAPS capsule Take 290 mcg by mouth daily before breakfast.   Yes Historical Provider, MD  polyethylene glycol (MIRALAX / GLYCOLAX) packet Take 17 g by mouth 2 (two) times daily.   Yes Historical Provider, MD  traMADol (ULTRAM) 50 MG tablet Take 50 mg by mouth every 12 (twelve) hours as needed for moderate pain or severe pain.    Yes Historical Provider, MD   No Known Allergies Review of Systems  Unable to perform ROS: Dementia    Physical Exam  Constitutional: No distress.  HENT:  Head: Normocephalic and atraumatic.  Cardiovascular: Normal rate and regular rhythm.   Pulmonary/Chest: Effort normal.  NRB  Abdominal: Soft. She exhibits no distension.  Musculoskeletal: She exhibits no edema.  Neurological:  Lethargic   Skin: Skin is warm and dry.  Sacral skin breakdown, breakdown between knees, right heel  Nursing note and vitals reviewed.   Vital Signs: BP (!) 89/41 (BP Location: Left Arm)   Pulse 83   Temp 98.1 F (36.7 C) (Axillary)   Resp (!) 28   Ht 5\' 4"  (1.626 m)   Wt  54.4 kg (119 lb 14.9 oz)   SpO2 100%   BMI 20.59 kg/m  Pain Assessment: No/denies pain       SpO2: SpO2: 100 % O2 Device:SpO2: 100 % O2 Flow Rate: .O2 Flow Rate (L/min): 2 L/min  IO: Intake/output summary:  Intake/Output Summary (Last 24 hours) at 12/19/16 1623 Last data filed at 12/19/16 1240  Gross per 24 hour  Intake             2550 ml  Output                0 ml  Net  2550 ml    LBM:   Baseline Weight: Weight: 54.4 kg (120 lb) Most recent weight: Weight: 54.4 kg (119 lb 14.9 oz)     Palliative Assessment/Data:   Flowsheet Rows   Flowsheet Row Most Recent Value  Intake Tab  Referral Department  Hospitalist  Unit at Time of Referral  ER  Palliative Care Primary Diagnosis  Neurology  Date Notified  12/19/16  Palliative Care Type  Return patient Palliative Care  Reason for referral  Clarify Goals of Care  Date of Admission  12/18/16  # of days IP prior to Palliative referral  1  Clinical Assessment  Palliative Performance Scale Score  20%  Pain Max last 24 hours  Not able to report  Pain Min Last 24 hours  Not able to report  Dyspnea Max Last 24 Hours  Not able to report  Dyspnea Min Last 24 hours  Not able to report  Psychosocial & Spiritual Assessment  Palliative Care Outcomes  Patient/Family meeting held?  Yes  Who was at the meeting?  Daughter Arlyss QueenMyra Reed, son Myrla HalstedLeon Farrish.   Patient/Family wishes: Interventions discontinued/not started   Mechanical Ventilation      Time In: 1515 Time Out: 1610 Time Total: 50 minutes Greater than 50%  of this time was spent counseling and coordinating care related to the above assessment and plan.  Signed by: Katheran Aweasha A Dove, NP   Please contact Palliative Medicine Team phone at [phone] for questions and concerns.  For individual provider: See Loretha StaplerAmion

## 2016-12-19 NOTE — Progress Notes (Signed)
Pharmacy Antibiotic Note  Ashlee BradfordMildred F Barron is Barron 83 y.o. female admitted on 12/18/2016 with sepsis / pna.  Pharmacy has been consulted for Vancomycin and Cefepime dosing.  Plan:  Vancomycin 500mg  IV q12h Check trough at steady state Cefepime 1gm IV q24hrs Monitor labs, renal fxn, progress and c/s Deescalate ABX when improved / appropriate.    Height: 5\' 4"  (162.6 cm) Weight: 120 lb (54.4 kg) IBW/kg (Calculated) : 54.7  Temp (24hrs), Avg:100.1 F (37.8 C), Min:99.3 F (37.4 C), Max:100.8 F (38.2 C)   Recent Labs Lab 12/18/16 2345 12/19/16 0334 12/19/16 0435 12/19/16 0911  WBC 12.8*  --   --   --   CREATININE 0.81  --  0.81  --   LATICACIDVEN  --  3.38*  --  7.0*    Estimated Creatinine Clearance: 42 mL/min (by C-G formula based on SCr of 0.81 mg/dL).    No Known Allergies  Antimicrobials this admission: Vanc 1/29 >>  Cefepime 1/29 >>   Dose adjustments this admission:  Microbiology results:  BCx: pending  UCx: pending   Sputum:    MRSA PCR:   Thank you for allowing pharmacy to be Barron part of this patient's care.  Ashlee Barron, Ashlee Barron Barron 12/19/2016 10:40 AM

## 2016-12-19 NOTE — H&P (Signed)
History and Physical    Ashlee Barron ZOX:[id number] DOB: 01/05/1928 DOA: 12/18/2016  PCP: No PCP Per Patient  Patient coming from:  SNF  Chief Complaint:  Sob, temp of 103  HPI: Ashlee BradfordMildred F Barron is a 83 y.o. female with medical history significant of advanced dementia, HTN, DM sent in from SNF for fever of 103 and worsening breathing.  Pt has advanced dementia, DNR paperwork with her.  Found to have sepsis with pna and uti.  Referred for admission for ivf and iv antiobiotics.  Family was called by dr Manus Gunningrancour who did not wish aggressive treatment, but good with ivf and abx.   Review of Systems:  Unable to obtain due to dementia  Past Medical History:  Diagnosis Date  . Constipation   . Dementia   . Diabetes mellitus without complication (HCC)   . Hypertension     Past Surgical History:  Procedure Laterality Date  . ABDOMINAL HYSTERECTOMY    . APPENDECTOMY    . CHOLECYSTECTOMY       reports that she has never smoked. She has never used smokeless tobacco. She reports that she does not drink alcohol or use drugs.  No Known Allergies  Family History  Problem Relation Age of Onset  . Family history unknown: Yes    Prior to Admission medications   Medication Sig Start Date End Date Taking? Authorizing Provider  acetaminophen (TYLENOL) 500 MG tablet Take 500 mg by mouth 3 (three) times daily as needed for mild pain, fever or headache.    Yes Historical Provider, MD  ALPRAZolam (XANAX) 0.25 MG tablet Take 0.25 mg by mouth daily.    Yes Historical Provider, MD  ALPRAZolam Prudy Feeler(XANAX) 1 MG tablet Take 1 mg by mouth at bedtime.   Yes Historical Provider, MD  bisacodyl (DULCOLAX) 10 MG suppository Place 10 mg rectally daily as needed for moderate constipation.   Yes Historical Provider, MD  divalproex (DEPAKOTE) 250 MG DR tablet Take 250 mg by mouth 2 (two) times daily.   Yes Historical Provider, MD  donepezil (ARICEPT) 10 MG tablet Take 10 mg by mouth at bedtime.   Yes Historical  Provider, MD  ferrous sulfate 325 (65 FE) MG tablet Take 325 mg by mouth daily with breakfast.   Yes Historical Provider, MD  haloperidol (HALDOL) 1 MG tablet Take 1 tablet (1 mg total) by mouth daily as needed for agitation. 09/11/16  Yes Pearson GrippeJames Kim, MD  linaclotide [facility](LINZESS) 290 MCG CAPS capsule Take 290 mcg by mouth daily before breakfast.   Yes Historical Provider, MD  QUEtiapine (SEROQUEL) 50 MG tablet Take 50 mg by mouth at bedtime.   Yes Historical Provider, MD  traMADol (ULTRAM) 50 MG tablet Take 50 mg by mouth every 12 (twelve) hours as needed for moderate pain or severe pain.    Yes Historical Provider, MD    Physical Exam: Vitals:   12/19/16 0200 12/19/16 0218 12/19/16 0230 12/19/16 0300  BP: 111/63 111/63 109/60 121/63  Pulse: 105 102 102 102  Resp: (!) 46 22 (!) 31 (!) 38  Temp:      TempSrc:      SpO2: 95% 96% 97% 96%  Weight:      Height:        Constitutional: NAD, calm, comfortable, dehydrated Vitals:   12/19/16 0200 12/19/16 0218 12/19/16 0230 12/19/16 0300  BP: 111/63 111/63 109/60 121/63  Pulse: 105 102 102 102  Resp: (!) 46 22 (!) 31 (!) 38  Temp:  TempSrc:      SpO2: 95% 96% 97% 96%  Weight:      Height:       Eyes: PERRL, lids and conjunctivae normal ENMT: Mucous membranes are dry. Posterior pharynx clear of any exudate or lesions.Normal dentition.  Neck: normal, supple, no masses, no thyromegaly Respiratory: clear to auscultation bilaterally, no wheezing, no crackles. Normal respiratory effort. No accessory muscle use.  Cardiovascular: Regular rate and rhythm, no murmurs / rubs / gallops. No extremity edema. 2+ pedal pulses. No carotid bruits.  Abdomen: no tenderness, no masses palpated. No hepatosplenomegaly. Bowel sounds positive.  Musculoskeletal: no clubbing / cyanosis. No joint deformity upper and lower extremities. Good ROM, no contractures. Normal muscle tone.  Skin: no rashes, lesions, ulcers. No induration Neurologic:  Does not respond to  questions, demented Psychiatric: calm, not agitated, demented   Labs on Admission: I have personally reviewed following labs and imaging studies  CBC:  Recent Labs Lab 12/18/16 2345  WBC 12.8*  NEUTROABS 11.5*  HGB 10.9*  HCT 36.5  MCV 85.1  PLT 166   Basic Metabolic Panel:  Recent Labs Lab 12/18/16 2345  NA 161*  K 3.2*  CL 121*  CO2 30  GLUCOSE 472*  BUN 38*  CREATININE 0.81  CALCIUM 8.5*   GFR: Estimated Creatinine Clearance: 42 mL/min (by C-G formula based on SCr of 0.81 mg/dL). Liver Function Tests:  Recent Labs Lab 12/18/16 2345  AST 16  ALT 10*  ALKPHOS 113  BILITOT 0.6  PROT 6.5  ALBUMIN 2.0*    Urine analysis:    Component Value Date/Time   COLORURINE YELLOW 12/18/2016 2345   APPEARANCEUR TURBID (A) 12/18/2016 2345   APPEARANCEUR Clear 12/18/2013 2115   LABSPEC 1.025 12/18/2016 2345   LABSPEC 1.008 12/18/2013 2115   PHURINE 5.0 12/18/2016 2345   GLUCOSEU >=500 (A) 12/18/2016 2345   GLUCOSEU Negative 12/18/2013 2115   HGBUR MODERATE (A) 12/18/2016 2345   BILIRUBINUR NEGATIVE 12/18/2016 2345   BILIRUBINUR Negative 12/18/2013 2115   KETONESUR NEGATIVE 12/18/2016 2345   PROTEINUR 100 (A) 12/18/2016 2345   UROBILINOGEN 0.2 05/11/2013 1501   NITRITE NEGATIVE 12/18/2016 2345   LEUKOCYTESUR MODERATE (A) 12/18/2016 2345   LEUKOCYTESUR Negative 12/18/2013 2115   Recent Results (from the past 240 hour(s))  Blood culture (routine x 2)     Status: None (Preliminary result)   Collection Time: 12/18/16 11:58 PM  Result Value Ref Range Status   Specimen Description BLOOD RIGHT ARM  Final   Special Requests BOTTLES DRAWN AEROBIC AND ANAEROBIC 5CC EACH  Final   Culture PENDING  Incomplete   Report Status PENDING  Incomplete  Blood culture (routine x 2)     Status: None (Preliminary result)   Collection Time: 12/19/16 12:10 AM  Result Value Ref Range Status   Specimen Description BLOOD RIGHT HAND  Final   Special Requests BOTTLES DRAWN AEROBIC  ONLY 5CC ONLY  Final   Culture PENDING  Incomplete   Report Status PENDING  Incomplete     Radiological Exams on Admission: Dg Chest Portable 1 View  Result Date: 12/19/2016 CLINICAL DATA:  Aortic atherosclerosis. EXAM: PORTABLE CHEST 1 VIEW COMPARISON:  11/07/2016 FINDINGS: Lungs are hypoinflated with heterogeneous airspace opacification over the right mid to lower lung and left base likely pneumonia. No evidence of effusion. Cardiomediastinal silhouette is within normal. There is minimal calcified plaque over the aortic arch. There are mild degenerative changes of the spine. IMPRESSION: Multifocal airspace process over the mid to lower lungs  right worse than left likely pneumonia. Electronically Signed   By: Elberta Fortis M.D.   On: 12/19/2016 00:06    EKG: Independently reviewed. Sinus tachycardia no acute issues cxr reviewed right infiltrate no edema Yellow DNR mose form at bedside from SNF Old records reviewed Case discussed with dr rancour  Assessment/Plan 83 yo female with advanced dementia and sepsis from uti/HCAP  Principal Problem:   Sepsis (HCC)-  Blood and urine cx obtained.  Place on vancomycin and cefepime.  Keep npo.  Flu neg.  Lactate over 3, ivf and repeat lactic q 3 hours.  Check procalcitonin  Active Problems:   Dehydration with hypernatremia - given ns ivf in ED, repeat bmp now.  Cont ns ivf   Dementia- noted   Acute respiratory failure (HCC)- noted, DNR   Hospital acquired PNA- as above   UTI (urinary tract infection)- as above   Hypernatremia- due to profound dehydration   Consider palliative care consult in am  DVT prophylaxis:  scds Code Status:  DNR Family Communication: none  Disposition Plan:  Per day team Consults called:  none Admission status:  admission   Jaxx Huish A MD [facility]  If 7PM-7AM, please contact night-coverage [email or web address] Password [email or web address]  12/19/2016, 3:41 AM

## 2016-12-20 DIAGNOSIS — R7881 Bacteremia: Secondary | ICD-10-CM

## 2016-12-20 LAB — GLUCOSE, CAPILLARY
GLUCOSE-CAPILLARY: 194 mg/dL — AB (ref 65–99)
Glucose-Capillary: 197 mg/dL — ABNORMAL HIGH (ref 65–99)
Glucose-Capillary: 195 mg/dL — ABNORMAL HIGH (ref 65–99)
Glucose-Capillary: 203 mg/dL — ABNORMAL HIGH (ref 65–99)
Glucose-Capillary: 167 mg/dL — ABNORMAL HIGH (ref 65–99)
Glucose-Capillary: 161 mg/dL — ABNORMAL HIGH (ref 65–99)

## 2016-12-20 LAB — BLOOD CULTURE ID PANEL (REFLEXED)
ACINETOBACTER BAUMANNII: NOT DETECTED
Acinetobacter baumannii: NOT DETECTED
CANDIDA ALBICANS: NOT DETECTED
CANDIDA ALBICANS: NOT DETECTED
CANDIDA GLABRATA: NOT DETECTED
CANDIDA KRUSEI: NOT DETECTED
CANDIDA KRUSEI: NOT DETECTED
CANDIDA PARAPSILOSIS: NOT DETECTED
CANDIDA PARAPSILOSIS: NOT DETECTED
CANDIDA TROPICALIS: NOT DETECTED
CARBAPENEM RESISTANCE: NOT DETECTED
Candida glabrata: NOT DETECTED
Candida tropicalis: NOT DETECTED
Carbapenem resistance: NOT DETECTED
ENTEROBACTER CLOACAE COMPLEX: NOT DETECTED
ENTEROBACTERIACEAE SPECIES: DETECTED — AB
ENTEROCOCCUS SPECIES: NOT DETECTED
Enterobacter cloacae complex: NOT DETECTED
Enterobacteriaceae species: NOT DETECTED
Enterococcus species: NOT DETECTED
Escherichia coli: DETECTED — AB
Escherichia coli: NOT DETECTED
Haemophilus influenzae: NOT DETECTED
Haemophilus influenzae: NOT DETECTED
KLEBSIELLA OXYTOCA: NOT DETECTED
KLEBSIELLA PNEUMONIAE: NOT DETECTED
KLEBSIELLA PNEUMONIAE: NOT DETECTED
Klebsiella oxytoca: NOT DETECTED
Listeria monocytogenes: NOT DETECTED
Listeria monocytogenes: NOT DETECTED
Methicillin resistance: NOT DETECTED
Neisseria meningitidis: NOT DETECTED
Neisseria meningitidis: NOT DETECTED
PROTEUS SPECIES: NOT DETECTED
PSEUDOMONAS AERUGINOSA: NOT DETECTED
Proteus species: NOT DETECTED
Pseudomonas aeruginosa: NOT DETECTED
STAPHYLOCOCCUS AUREUS BCID: NOT DETECTED
STAPHYLOCOCCUS SPECIES: NOT DETECTED
STREPTOCOCCUS PNEUMONIAE: NOT DETECTED
STREPTOCOCCUS PYOGENES: NOT DETECTED
Serratia marcescens: NOT DETECTED
Serratia marcescens: NOT DETECTED
Staphylococcus aureus (BCID): NOT DETECTED
Staphylococcus species: NOT DETECTED
Streptococcus agalactiae: NOT DETECTED
Streptococcus agalactiae: NOT DETECTED
Streptococcus pneumoniae: NOT DETECTED
Streptococcus pyogenes: NOT DETECTED
Streptococcus species: DETECTED — AB
Streptococcus species: NOT DETECTED
VANCOMYCIN RESISTANCE: NOT DETECTED

## 2016-12-20 LAB — BASIC METABOLIC PANEL
Anion gap: 8 (ref 5–15)
BUN: 36 mg/dL — AB (ref 6–20)
CO2: 26 mmol/L (ref 22–32)
Calcium: 7.6 mg/dL — ABNORMAL LOW (ref 8.9–10.3)
Chloride: 129 mmol/L — ABNORMAL HIGH (ref 101–111)
Creatinine, Ser: 0.63 mg/dL (ref 0.44–1.00)
GFR calc Af Amer: >60 mL/min (ref >60)
GLUCOSE: 223 mg/dL — AB (ref 65–99)
Potassium: 2.6 mmol/L — CL (ref 3.5–5.1)
Sodium: 163 mmol/L (ref 135–145)

## 2016-12-20 LAB — MAGNESIUM: MAGNESIUM: 1.9 mg/dL (ref 1.7–2.4)

## 2016-12-20 LAB — CG4 I-STAT (LACTIC ACID): LACTIC ACID, VENOUS: 2.77 mmol/L — AB (ref 0.5–1.9)

## 2016-12-20 LAB — MRSA PCR SCREENING: MRSA by PCR: NEGATIVE

## 2016-12-20 LAB — STREP PNEUMONIAE URINARY ANTIGEN: Strep Pneumo Urinary Antigen: POSITIVE — AB

## 2016-12-20 MED ORDER — DEXTROSE 5 % IV SOLN
INTRAVENOUS | Status: DC
  Administered 2016-12-20 – 2016-12-21 (×2): via INTRAVENOUS

## 2016-12-20 MED ORDER — POTASSIUM CHLORIDE 10 MEQ/100ML IV SOLN
10.0000 meq | INTRAVENOUS | Status: AC
  Administered 2016-12-20 (×6): 10 meq via INTRAVENOUS
  Filled 2016-12-20 (×2): qty 100

## 2016-12-20 NOTE — Progress Notes (Addendum)
PHARMACY - PHYSICIAN COMMUNICATION CRITICAL VALUE ALERT - BLOOD CULTURE IDENTIFICATION (BCID)  Results for orders placed or performed during the hospital encounter of 12/18/16  Blood Culture ID Panel (Reflexed) (Collected: 12/19/2016 11:00 PM)  Result Value Ref Range   Enterococcus species NOT DETECTED NOT DETECTED   Vancomycin resistance NOT DETECTED NOT DETECTED   Listeria monocytogenes NOT DETECTED NOT DETECTED   Staphylococcus species NOT DETECTED NOT DETECTED   Staphylococcus aureus NOT DETECTED NOT DETECTED   Methicillin resistance NOT DETECTED NOT DETECTED   Streptococcus species DETECTED (A) NOT DETECTED   Streptococcus agalactiae NOT DETECTED NOT DETECTED   Streptococcus pneumoniae NOT DETECTED NOT DETECTED   Streptococcus pyogenes NOT DETECTED NOT DETECTED   Acinetobacter baumannii NOT DETECTED NOT DETECTED   Enterobacteriaceae species NOT DETECTED NOT DETECTED   Enterobacter cloacae complex NOT DETECTED NOT DETECTED   Escherichia coli NOT DETECTED NOT DETECTED   Klebsiella oxytoca NOT DETECTED NOT DETECTED   Klebsiella pneumoniae NOT DETECTED NOT DETECTED   Proteus species NOT DETECTED NOT DETECTED   Serratia marcescens NOT DETECTED NOT DETECTED   Carbapenem resistance NOT DETECTED NOT DETECTED   Haemophilus influenzae NOT DETECTED NOT DETECTED   Neisseria meningitidis NOT DETECTED NOT DETECTED   Pseudomonas aeruginosa NOT DETECTED NOT DETECTED   Candida albicans NOT DETECTED NOT DETECTED   Candida glabrata NOT DETECTED NOT DETECTED   Candida krusei NOT DETECTED NOT DETECTED   Candida parapsilosis NOT DETECTED NOT DETECTED   Candida tropicalis NOT DETECTED NOT DETECTED    Name of physician (or Provider) Contacted: Dr. Ardyth HarpsHernandez  A/P  Patient with BCID: Strep species, E. Cloacae, and E. Coli.  Patient currently on broad spectrum abx with vancomycin and cefepime. Continue current regimen. F/U cultures and sensitivities.   Ashlee CyphersLorie Trinna Barron, BS Ashlee Barron D, BCPS Clinical  Pharmacist Pager [phone]  Ashlee Barron, Ashlee Barron 12/20/2016  10:39 AM

## 2016-12-20 NOTE — Progress Notes (Signed)
[facility]. AC states they will need to order the air mattress.

## 2016-12-20 NOTE — Progress Notes (Signed)
CRITICAL VALUE ALERT  Critical value received:  Positive Blood Cultures  Date of notification:  12/20/2016  Time of notification:  0055  Critical value read back: yes  Nurse who received alert:  Felecia JanSheena Whitman Meinhardt, RN  MD notified (1st page):  Dr. Onalee Huaavid  Time of first page:  01:16  MD notified (2nd page): Dr. Onalee Huaavid  Time of second page: 0138  Responding MD:  No response  Time MD responded:  No response

## 2016-12-20 NOTE — Progress Notes (Signed)
PROGRESS NOTE    Ashlee Barron  ZOX:[id number] DOB: [birth year] DOA: 12/18/2016 PCP: No PCP Per Patient     Brief Narrative:  Patient is an 83 year old woman admitted from her skilled nursing facility on 1/28 due to shortness of breath and fever. She has a history of advanced dementia and is basically bedbound prior to admission. She is a DO NOT RESUSCITATE. She is found to have gram positive and gram-negative bacteremia. Is on broad-spectrum antibiotics. We have had multiple palliative care interventions with the family. They appear somewhat in denial of the situation and are hoping that the patient can still be alive until next weekend when other family members can come in.   Assessment & Plan:   Principal Problem:   Sepsis (HCC) Active Problems:   Dehydration   Dementia   Acute respiratory failure [facility])   Hospital acquired PNA   Urinary tract infection with hematuria   Hypernatremia   Pressure injury of skin   Gram-negative bacteremia   Gram-positive bacteremia   Sepsis -Source appears to be both HCAP and a UTI. -Blood culture reflex panel showing strep, Escherichia coli and Enterobacter. -Plan for now to continue broad-spectrum antibiotic therapy with vancomycin and cefepime. -Continue palliative care discussions given poor prognosis and poor protoplasm at baseline.  Hypokalemia -Replete IV, check magnesium level.  Hypernatremia -Sodium is 163 on 1/30. -Switch fluids to D5W, recheck sodium levels in a.m. -This is due to profound dehydration and lack of free water intake in a patient with severe dementia.  Severe dementia -Noted.   DVT prophylaxis: SCDs Code Status: DO NOT RESUSCITATE Family Communication: Nephew and daughter Disposition Plan: To be determined, she is already active with hospice, hospital death versus SNF with hospice versus residential hospice  Consultants:   Palliative care  Procedures:   None  Antimicrobials:  Anti-infectives    Start     Dose/Rate Route Frequency Ordered Stop   12/19/16 2200  ceFEPIme (MAXIPIME) 1 g in dextrose 5 % 50 mL IVPB     1 g 100 mL/hr over 30 Minutes Intravenous Every 24 hours 12/19/16 1035     12/19/16 1400  vancomycin (VANCOCIN) 500 mg in sodium chloride 0.9 % 100 mL IVPB     500 mg 100 mL/hr over 60 Minutes Intravenous Every 12 hours 12/19/16 1038     12/19/16 0115  vancomycin (VANCOCIN) IVPB 750 mg/150 ml premix     750 mg 150 mL/hr over 60 Minutes Intravenous  Once 12/19/16 0100 12/19/16 0326   12/19/16 0045  ceFEPIme (MAXIPIME) 1 g in dextrose 5 % 50 mL IVPB     1 g 100 mL/hr over 30 Minutes Intravenous  Once 12/19/16 0035 12/19/16 0135       Subjective: Remains unresponsive  Objective: Vitals:   12/19/16 1527 12/19/16 2128 12/20/16 0513 12/20/16 1417  BP: (!) 89/41 (!) 92/46 118/63 128/62  Pulse:  86 77 89  Resp:  15 13 15   Temp:  98.6 F (37 C) 98 F (36.7 C) 97.5 F (36.4 C)  TempSrc:  Oral Oral Axillary  SpO2:  100% 100% 100%  Weight:      Height:        Intake/Output Summary (Last 24 hours) at 12/20/16 1609 Last data filed at 12/20/16 1515  Gross per 24 hour  Intake          1163.75 ml  Output             1351 ml  Net          -  187.25 ml   Filed Weights   12/18/16 2321 12/19/16 1346  Weight: 54.4 kg (120 lb) 54.4 kg (119 lb 14.9 oz)    Examination:  General exam: Unresponsive Respiratory system: Clear to auscultation. Respiratory effort normal. Cardiovascular system:RRR. No murmurs, rubs, gallops. Gastrointestinal system: Abdomen is nondistended, soft and nontender. No organomegaly or masses felt. Normal bowel sounds heard. Central nervous system: In coma, unable to assess Extremities: No C/C/E, +pedal pulses Skin: No rashes, lesions or ulcers Psychiatry: Unable to assess given current mental state    Data Reviewed: I have personally reviewed following labs and imaging studies  CBC:  Recent Labs Lab 12/18/16 2345  WBC 12.8*  NEUTROABS  11.5*  HGB 10.9*  HCT 36.5  MCV 85.1  PLT 166   Basic Metabolic Panel:  Recent Labs Lab 12/18/16 2345 12/19/16 0435 12/20/16 0449 12/20/16 1214  NA 161* 160* 163*  --   K 3.2* 3.0* 2.6*  --   CL 121* 122* 129*  --   CO2 30 26 26   --   GLUCOSE 472* 486* 223*  --   BUN 38* 36* 36*  --   CREATININE 0.81 0.81 0.63  --   CALCIUM 8.5* 8.1* 7.6*  --   MG  --   --   --  1.9   GFR: Estimated Creatinine Clearance: 42.5 mL/min (by C-G formula based on SCr of 0.63 mg/dL). Liver Function Tests:  Recent Labs Lab 12/18/16 2345  AST 16  ALT 10*  ALKPHOS 113  BILITOT 0.6  PROT 6.5  ALBUMIN 2.0*   No results for input(s): LIPASE, AMYLASE in the last 168 hours. No results for input(s): AMMONIA in the last 168 hours. Coagulation Profile: No results for input(s): INR, PROTIME in the last 168 hours. Cardiac Enzymes: No results for input(s): CKTOTAL, CKMB, CKMBINDEX, TROPONINI in the last 168 hours. BNP (last 3 results) No results for input(s): PROBNP in the last 8760 hours. HbA1C: No results for input(s): HGBA1C in the last 72 hours. CBG:  Recent Labs Lab 12/19/16 2120 12/20/16 0002 12/20/16 0508 12/20/16 0810 12/20/16 1124  GLUCAP 198* 194* 197* 195* 203*   Lipid Profile: No results for input(s): CHOL, HDL, LDLCALC, TRIG, CHOLHDL, LDLDIRECT in the last 72 hours. Thyroid Function Tests: No results for input(s): TSH, T4TOTAL, FREET4, T3FREE, THYROIDAB in the last 72 hours. Anemia Panel: No results for input(s): VITAMINB12, FOLATE, FERRITIN, TIBC, IRON, RETICCTPCT in the last 72 hours. Urine analysis:    Component Value Date/Time   COLORURINE YELLOW 12/18/2016 2345   APPEARANCEUR TURBID (A) 12/18/2016 2345   APPEARANCEUR Clear 12/18/2013 2115   LABSPEC 1.025 12/18/2016 2345   LABSPEC 1.008 12/18/2013 2115   PHURINE 5.0 12/18/2016 2345   GLUCOSEU >=500 (A) 12/18/2016 2345   GLUCOSEU Negative 12/18/2013 2115   HGBUR MODERATE (A) 12/18/2016 2345   BILIRUBINUR  NEGATIVE 12/18/2016 2345   BILIRUBINUR Negative 12/18/2013 2115   KETONESUR NEGATIVE 12/18/2016 2345   PROTEINUR 100 (A) 12/18/2016 2345   UROBILINOGEN 0.2 05/11/2013 1501   NITRITE NEGATIVE 12/18/2016 2345   LEUKOCYTESUR MODERATE (A) 12/18/2016 2345   LEUKOCYTESUR Negative 12/18/2013 2115   Sepsis Labs: @LABRCNTIP (procalcitonin:4,lacticidven:4)  ) Recent Results (from the past 240 hour(s))  Urine culture     Status: Abnormal (Preliminary result)   Collection Time: 12/18/16 11:45 PM  Result Value Ref Range Status   Specimen Description URINE, RANDOM  Final   Special Requests NONE  Final   Culture (A)  Final    >=100,000 COLONIES/mL ESCHERICHIA  COLI SUSCEPTIBILITIES TO FOLLOW Performed at [facility], [address] [place], [place] [address]    Report Status PENDING  Incomplete  Blood culture (routine x 2)     Status: Abnormal (Preliminary result)   Collection Time: 12/18/16 11:58 PM  Result Value Ref Range Status   Specimen Description BLOOD RIGHT ARM  Final   Special Requests BOTTLES DRAWN AEROBIC AND ANAEROBIC 5CC EACH  Final   Culture  Setup Time   Final    GRAM NEGATIVE RODS RECOVERED FROM AEROBIC BOTTLE Gram Stain Report Called to,Read Back By and Verified With: VALDESE DILDY LPN AT [id number] BY HFLYNT 12/19/16 GRAM POSITIVE COCCI IN CHAINS RECOVERED FROM ANAEROBIC BOTTLE Gram Stain Report Called to,Read Back By and Verified With: ALSTON,C ON 12/19/16 AT 1930 BY LOY,C Organism ID to follow CRITICAL RESULT CALLED TO, READ BACK BY AND VERIFIED WITH: SHEENA HAMILTON,RN @0053  12/20/16 MKELLY,MLT    Culture (A)  Final    ESCHERICHIA COLI GRAM POSITIVE COCCI CULTURE REINCUBATED FOR BETTER GROWTH Performed at [facility], [address] [place], [place] [address]    Report Status PENDING  Incomplete  Blood Culture ID Panel (Reflexed)     Status: Abnormal   Collection Time: 12/18/16 11:58 PM  Result Value Ref Range Status   Enterococcus species NOT DETECTED NOT  DETECTED Corrected   Listeria monocytogenes NOT DETECTED NOT DETECTED Corrected   Staphylococcus species NOT DETECTED NOT DETECTED Corrected   Staphylococcus aureus NOT DETECTED NOT DETECTED Corrected   Streptococcus species NOT DETECTED NOT DETECTED Corrected    Comment: CORRECTED ON 01/30 AT 0054: PREVIOUSLY REPORTED AS DETECTED CRITICAL RESULT CALLED TO, READ BACK BY AND VERIFIED WITH: SHEENA HAMILTON,RN @0053  12/20/16 MKELLY,MLT   Streptococcus agalactiae NOT DETECTED NOT DETECTED Corrected   Streptococcus pneumoniae NOT DETECTED NOT DETECTED Corrected   Streptococcus pyogenes NOT DETECTED NOT DETECTED Corrected   Acinetobacter baumannii NOT DETECTED NOT DETECTED Corrected   Enterobacteriaceae species DETECTED (A) NOT DETECTED Corrected    Comment: CRITICAL RESULT CALLED TO, READ BACK BY AND VERIFIED WITH: SHEENA HAMILTON,RN @0053  12/20/16 MKELLY,MLT CORRECTED ON 01/30 AT 0054: PREVIOUSLY REPORTED AS NOT DETECTED    Enterobacter cloacae complex NOT DETECTED NOT DETECTED Corrected   Escherichia coli DETECTED (A) NOT DETECTED Corrected    Comment: CRITICAL RESULT CALLED TO, READ BACK BY AND VERIFIED WITH: SHEENA HAMILTON,RN @0053  12/20/16 MKELLY,MLT CORRECTED ON 01/30 AT 0054: PREVIOUSLY REPORTED AS NOT DETECTED    Klebsiella oxytoca NOT DETECTED NOT DETECTED Corrected   Klebsiella pneumoniae NOT DETECTED NOT DETECTED Corrected   Proteus species NOT DETECTED NOT DETECTED Corrected   Serratia marcescens NOT DETECTED NOT DETECTED Corrected   Carbapenem resistance NOT DETECTED NOT DETECTED Corrected   Haemophilus influenzae NOT DETECTED NOT DETECTED Corrected   Neisseria meningitidis NOT DETECTED NOT DETECTED Corrected   Pseudomonas aeruginosa NOT DETECTED NOT DETECTED Corrected   Candida albicans NOT DETECTED NOT DETECTED Corrected   Candida glabrata NOT DETECTED NOT DETECTED Corrected   Candida krusei NOT DETECTED NOT DETECTED Corrected   Candida parapsilosis NOT DETECTED NOT  DETECTED Corrected   Candida tropicalis NOT DETECTED NOT DETECTED Corrected    Comment: Performed at [facility], [address] [place], [place] [address]  Blood culture (routine x 2)     Status: None (Preliminary result)   Collection Time: 12/19/16 12:10 AM  Result Value Ref Range Status   Specimen Description BLOOD RIGHT HAND  Final   Special Requests BOTTLES DRAWN AEROBIC ONLY 5CC  ONLY  Final   Culture NO GROWTH 1 DAY  Final   Report Status PENDING  Incomplete  MRSA PCR Screening     Status: None   Collection Time: 12/19/16 11:39 AM  Result Value Ref Range Status   MRSA by PCR NEGATIVE NEGATIVE Final    Comment:        The GeneXpert MRSA Assay (FDA approved for NASAL specimens only), is one component of a comprehensive MRSA colonization surveillance program. It is not intended to diagnose MRSA infection nor to guide or monitor treatment for MRSA infections.   Blood Culture ID Panel (Reflexed)     Status: Abnormal   Collection Time: 12/19/16 11:00 PM  Result Value Ref Range Status   Enterococcus species NOT DETECTED NOT DETECTED Final   Vancomycin resistance NOT DETECTED NOT DETECTED Final   Listeria monocytogenes NOT DETECTED NOT DETECTED Final   Staphylococcus species NOT DETECTED NOT DETECTED Final   Staphylococcus aureus NOT DETECTED NOT DETECTED Final   Methicillin resistance NOT DETECTED NOT DETECTED Final   Streptococcus species DETECTED (A) NOT DETECTED Final    Comment: CRITICAL RESULT CALLED TO, READ BACK BY AND VERIFIED WITH: SHEENA HAMILTON,RN @0053  12/20/16 MKELLY,MLT    Streptococcus agalactiae NOT DETECTED NOT DETECTED Final   Streptococcus pneumoniae NOT DETECTED NOT DETECTED Final   Streptococcus pyogenes NOT DETECTED NOT DETECTED Final   Acinetobacter baumannii NOT DETECTED NOT DETECTED Final   Enterobacteriaceae species NOT DETECTED NOT DETECTED Final   Enterobacter cloacae complex NOT DETECTED NOT DETECTED Final   Escherichia coli NOT  DETECTED NOT DETECTED Final   Klebsiella oxytoca NOT DETECTED NOT DETECTED Final   Klebsiella pneumoniae NOT DETECTED NOT DETECTED Final   Proteus species NOT DETECTED NOT DETECTED Final   Serratia marcescens NOT DETECTED NOT DETECTED Final   Carbapenem resistance NOT DETECTED NOT DETECTED Final   Haemophilus influenzae NOT DETECTED NOT DETECTED Final   Neisseria meningitidis NOT DETECTED NOT DETECTED Final   Pseudomonas aeruginosa NOT DETECTED NOT DETECTED Final   Candida albicans NOT DETECTED NOT DETECTED Final   Candida glabrata NOT DETECTED NOT DETECTED Final   Candida krusei NOT DETECTED NOT DETECTED Final   Candida parapsilosis NOT DETECTED NOT DETECTED Final   Candida tropicalis NOT DETECTED NOT DETECTED Final    Comment: Performed at [facility], [address] [place], [place] [address]         Radiology Studies: Dg Chest Portable 1 View  Result Date: 12/19/2016 CLINICAL DATA:  Sepsis and pneumonia. EXAM: PORTABLE CHEST 1 VIEW COMPARISON:  Single-view of the chest 12/18/2016 and 11/07/2016. FINDINGS: Bilateral mid and lower lung zone airspace disease does not appear changed compared to the most recent examination. Heart size is normal. No pneumothorax. Aortic atherosclerosis noted. IMPRESSION: No marked change in bilateral airspace disease most consistent with pneumonia. Atherosclerosis. Electronically Signed   By: Drusilla Kanner M.D.   On: 12/19/2016 07:23   Dg Chest Portable 1 View  Result Date: 12/19/2016 CLINICAL DATA:  Aortic atherosclerosis. EXAM: PORTABLE CHEST 1 VIEW COMPARISON:  11/07/2016 FINDINGS: Lungs are hypoinflated with heterogeneous airspace opacification over the right mid to lower lung and left base likely pneumonia. No evidence of effusion. Cardiomediastinal silhouette is within normal. There is minimal calcified plaque over the aortic arch. There are mild degenerative changes of the spine. IMPRESSION: Multifocal airspace process over the mid to  lower lungs right worse than left likely pneumonia. Electronically Signed   By: Elberta Fortis M.D.   On: 12/19/2016  00:06        Scheduled Meds: . ceFEPime (MAXIPIME) IV  1 g Intravenous Q24H  . enoxaparin (LOVENOX) injection  40 mg Subcutaneous Q24H  . insulin aspart  0-9 Units Subcutaneous Q4H  . potassium chloride  10 mEq Intravenous Q1 Hr x 6  . sodium hypochlorite   Irrigation Daily  . vancomycin  500 mg Intravenous Q12H   Continuous Infusions: . dextrose 75 mL/hr at 12/20/16 1224     LOS: 1 day    Time spent: 25 minutes. Greater than 50% of this time was spent in direct contact with the patient coordinating care.     Chaya Jan, MD [facility] Pager [phone]  If 7PM-7AM, please contact night-coverage [email or web address] Password [email or web address] 12/20/2016, 4:09 PM

## 2016-12-20 NOTE — Plan of Care (Signed)
Problem: Education: Goal: Knowledge of [place] [facility] Education information/materials will improve Outcome: Progressing Pt not able to respond appropriately verbally. She was able to say ok to some answers and was nonverbal for others.  Problem: Physical Regulation: Goal: Ability to maintain clinical measurements within normal limits will improve Outcome: Progressing See doc flowsheet. Pt's blood pressure runs soft.   Problem: Skin Integrity: Goal: Risk for impaired skin integrity will decrease Outcome: Not Progressing Pt received wound care consult. Dressing changes in place and pt has prevalon boot for offloading.   Problem: Tissue Perfusion: Goal: Risk factors for ineffective tissue perfusion will decrease Outcome: Not Progressing Pt has orders for SCDs and Lovenox, but family states she is in palliative care, only asking for antibiotics and fluids.  Problem: Activity: Goal: Risk for activity intolerance will decrease Outcome: Not Progressing Passive range of motion performed, and pt does minimum movement on her own.   Problem: Nutrition: Goal: Adequate nutrition will be maintained Outcome: Not Progressing Pt NPO.

## 2016-12-20 NOTE — Progress Notes (Signed)
Attempted to contact Dr. Onalee Huaavid at 07:00 to clarify order on pt. She verbalized order to give pt 40% KCl in 0.9% NS, but the pt's Na was elevated at 163. Dr. Onalee Huaavid did not respond, so nurse paged Dr. Ardyth HarpsHernandez. Awaiting response or new orders. Communicated this to oncoming first shift nurse and nurse states she will get with Dr. Ardyth HarpsHernandez.

## 2016-12-20 NOTE — Clinical Social Work Note (Signed)
Clinical Social Work Assessment  Patient Details  Name: Ashlee Barron MRN: [id number] Date of Birth: 04/24/1928  Date of referral:  12/20/16               Reason for consult:  End of Life/Hospice, Discharge Planning                Permission sought to share information with:    Permission granted to share information::     Name::        Agency::     Relationship::     Contact Information:     Housing/Transportation Living arrangements for the past 2 months:  [place] Information:  Adult Children Patient Interpreter Needed:  None Criminal Activity/Legal Involvement Pertinent to Current Situation/Hospitalization:  No - Comment as needed Significant Relationships:  Adult Children Lives with:  Facility Resident Do you feel safe going back to the place where you live?  Yes Need for family participation in patient care:  Yes (Comment)  Care giving concerns:  Will assess for disposition needs when appropriate.    Social Worker assessment / plan:  CSW met with pt's daughter, [name] and [name]'s husband. Pt unable to participate in assessment. [name] shared that she had just met with palliative care. Pt has had [facility] for several weeks. She has been a resident there since last fall. [name] reports pt has had multiple admissions in past 6 months. CSW will continue to follow.   Employment status:  Retired Nurse, adult PT Recommendations:  Not assessed at this time Information / Referral to community resources:  Other (Comment Required) (to be determined)  Patient/Family's Response to care:  Pt's daughter shared conversation with palliative care and indicates that they will continue treatment at this time and will re-evaluate as needed.   Patient/Family's Understanding of and Emotional Response to Diagnosis, Current Treatment, and Prognosis:  Pt's daughter reports understanding of illness. She states she is not in  denial, but is full of hope and faith and plans to take things a day at a time. Support provided.   Emotional Assessment Appearance:  Appears stated age Attitude/Demeanor/Rapport:  Unable to Assess Affect (typically observed):  Unable to Assess Orientation:    Alcohol / Substance use:  Not Applicable Psych involvement (Current and /or in the community):  No (Comment)  Discharge Needs  Concerns to be addressed:  Discharge Planning Concerns Readmission within the last 30 days:  No Current discharge risk:  Chronically ill Barriers to Discharge:  Continued Medical Work up   Salome Arnt, [place] 12/20/2016, 3:14 PM [phone]

## 2016-12-20 NOTE — Progress Notes (Signed)
Notified AC pt needs left prevalon boot per order.

## 2016-12-20 NOTE — Progress Notes (Signed)
Daily Progress Note   Patient Name: Ashlee Barron       Date: 12/20/2016 DOB: [birth year]  Age: 83 y.o. MRN#: [id number] Attending Physician: Memory Argue* Primary Care Physician: No PCP Per Patient Admit Date: 12/18/2016  Reason for Consultation/Follow-up: Establishing goals of care and Psychosocial/spiritual support  Subjective: Ashlee Barron is lying quietly in bed. She does not appear to be uncomfortable. She does not communicate with me in any meaningful way, she does briefly flutter her eyes when I touch her. At bedside today is nephew Shon Hale. We talk about her positive blood cultures, and my concern. I share that we have found 3 bacteria in her blood. Shon Hale states the number 3 means "the father, the son and the [place]".  I talk about the possibility of bacteria being "seeded" and her heart bowels are other places. I share the concern over repeat infections, or her inability to overcome this infection. 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 I share that it would be good to know what was important to Ashlee Barron at this time. Shon Hale agrees. Daughter [name] and her husband arrive.  We also talk at length about positive blood cultures. Shon Hale again makes a statement about the [place]. Family states their goal is to continue with IV fluids and antibiotics. They talk a family member, cousin Tobi Bastos, arriving on Friday. All questions answered, no needs at this time.  Length of Stay: 1  Current Medications: Scheduled Meds:  . ceFEPime (MAXIPIME) IV  1 g Intravenous Q24H  . enoxaparin (LOVENOX) injection  40 mg Subcutaneous Q24H  . insulin aspart  0-9 Units Subcutaneous Q4H  . potassium chloride  10 mEq Intravenous Q1 Hr x 6  . sodium hypochlorite   Irrigation Daily  . vancomycin   500 mg Intravenous Q12H    Continuous Infusions: . dextrose 75 mL/hr at 12/20/16 1224    PRN Meds:   Physical Exam  Constitutional: No distress.  Frail, acutely ill appearing  HENT:  Head: Normocephalic and atraumatic.  Cardiovascular: Normal rate and regular rhythm.   Pulmonary/Chest: Effort normal. No respiratory distress.  Venturi mask  Musculoskeletal: She exhibits no edema.  Neurological:  Does not respond to touch, eye flutter only  Skin: Skin is warm and dry.  Sacral wound, bilateral medial knee wounds, left  heel wound  Nursing note and vitals reviewed.           Vital Signs: BP 128/62 (BP Location: Right Arm)   Pulse 89   Temp 97.5 F (36.4 C) (Axillary)   Resp 15   Ht 5\' 4"  (1.626 m)   Wt 54.4 kg (119 lb 14.9 oz)   SpO2 100%   BMI 20.59 kg/m  SpO2: SpO2: 100 % O2 Device: O2 Device: Venturi Mask O2 Flow Rate: O2 Flow Rate (L/min): 2 L/min  Intake/output summary:  Intake/Output Summary (Last 24 hours) at 12/20/16 1431 Last data filed at 12/20/16 0700  Gross per 24 hour  Intake              650 ml  Output             1351 ml  Net             -701 ml   LBM:   Baseline Weight: Weight: 54.4 kg (120 lb) Most recent weight: Weight: 54.4 kg (119 lb 14.9 oz)       Palliative Assessment/Data:    Flowsheet Rows   Flowsheet Row Most Recent Value  Intake Tab  Referral Department  Hospitalist  Unit at Time of Referral  ER  Palliative Care Primary Diagnosis  Neurology  Date Notified  12/19/16  Palliative Care Type  Return patient Palliative Care  Reason for referral  Clarify Goals of Care  Date of Admission  12/18/16  # of days IP prior to Palliative referral  1  Clinical Assessment  Palliative Performance Scale Score  20%  Pain Max last 24 hours  Not able to report  Pain Min Last 24 hours  Not able to report  Dyspnea Max Last 24 Hours  Not able to report  Dyspnea Min Last 24 hours  Not able to report  Psychosocial & Spiritual Assessment  Palliative  Care Outcomes  Patient/Family meeting held?  Yes  Who was at the meeting?  Daughter [name], son [name].   Patient/Family wishes: Interventions discontinued/not started   Mechanical Ventilation      Patient Active Problem List   Diagnosis Date Noted  . Sepsis (HCC) 12/19/2016  . Acute respiratory failure (HCC) 12/19/2016  . Hospital acquired PNA 12/19/2016  . Urinary tract infection with hematuria 12/19/2016  . Hypernatremia 12/19/2016  . Pressure injury of skin 12/19/2016  . HCAP (healthcare-associated pneumonia)   . Palliative care encounter   . Goals of care, counseling/discussion   . Hypoalbuminemia 11/01/2016  . Edema of left foot 11/01/2016  . Acute cystitis without hematuria   . Sepsis secondary to UTI (HCC) 10/31/2016  . Type 2 diabetes mellitus (HCC) 10/31/2016  . Dementia 10/31/2016  . Essential hypertension 10/31/2016  . Aspiration pneumonia (HCC) 10/31/2016  . Abnormal EKG 10/31/2016  . Altered mental status 10/19/2016  . Constipation 09/09/2016  . Dehydration 09/09/2016  . Diabetes (HCC) 09/09/2016    Palliative Care Assessment & Plan   Patient Profile: 83 y.o. female  with past medical history of Dementia with declines over the last 2 months, diabetes, hypertension, constipation admitted on 12/18/2016 with sepsis.   Assessment: sepsis; IV fluids and IV antibiotics, complicated by positive blood cultures. End stage dementia; patient is bedbound, under the care of hospice at this time. Family states that their goal of bringing [name] to the hospital was to see if she had something that was reversible. We continue to discuss the chronic illness trajectory related to changes for  dementia.  Recommendations/Plan:  families desire is to continue to treat the treatable, continue with IV fluids and IV antibiotics. The goal is for cousin Tobi Bastosnna, to arrive and visit Ashlee Barron on Friday.  Goals of Care and Additional Recommendations:  Limitations on Scope  of Treatment: Continue to treat the treatable, IV fluids and antibiotics,CPR or intimidation.  Code Status:    Code Status Orders        Start     Ordered   12/19/16 1701  Do not attempt resuscitation (DNR)  Continuous    Question Answer Comment  In the event of cardiac or respiratory ARREST Do not call a "code blue"   In the event of cardiac or respiratory ARREST Do not perform Intubation, CPR, defibrillation or ACLS   In the event of cardiac or respiratory ARREST Use medication by any route, position, wound care, and other measures to relive pain and suffering. May use oxygen, suction and manual treatment of airway obstruction as needed for comfort.      12/19/16 1700    Code Status History    Date Active Date Inactive Code Status Order ID Comments User Context   12/19/2016  3:59 AM 12/19/2016  5:00 PM DNR [id number]  Glynn OctaveStephen Rancour, MD ED   10/31/2016  8:16 PM 11/05/2016  5:14 PM DNR [id number]  Bobette Moavid Manuel Ortiz, MD Inpatient   10/20/2016 12:31 AM 10/21/2016  8:18 PM DNR [id number]  Pearson GrippeJames Kim, MD Inpatient   09/09/2016  8:06 PM 09/12/2016  4:08 PM Full Code [id number]  Pearson GrippeJames Kim, MD Inpatient    Advance Directive Documentation   Flowsheet Row Most Recent Value  Type of Advance Directive  Out of facility DNR (pink MOST or yellow form)  Pre-existing out of facility DNR order (yellow form or pink MOST form)  No data  "MOST" Form in Place?  No data       Prognosis:   < 2 weeks would not be surprising based on 3 hospitalizations in the last 6 months functional decline over the last 2 months, frailty, sepsis, now complicated with 3 bacteria in her blood.  Discharge Planning:  To be determined, in-hospital death would not be surprising  Care plan was discussed with nursing staff, case manager, social worker, and Dr. Viann FishHernandes  Thank you for allowing the Palliative Medicine Team to assist in the care of this patient.   Time In: 1045  Time Out: 1115  Total Time 30 minutes   Prolonged Time Billed  no       Greater than 50%  of this time was spent counseling and coordinating care related to the above assessment and plan.  Katheran Aweasha A Dove, NP  Please contact Palliative Medicine Team phone at [phone] for questions and concerns.

## 2016-12-20 NOTE — Progress Notes (Signed)
CRITICAL VALUE ALERT  Critical value received:  Sodium 163 and Potassium 2.6  Date of notification:  12/20/2016  Time of notification:  06:43  Critical value read back: yes  Nurse who received alert: Felecia JanSheena Adar Rase  MD notified (1st page): Dr. Onalee Huaavid  Time of first page:  06:45  MD notified (2nd page):   Time of second page:  Responding MD:  Dr. Onalee Huaavid  Time MD responded:  [phone]

## 2016-12-21 LAB — BASIC METABOLIC PANEL
ANION GAP: 8 (ref 5–15)
BUN: 28 mg/dL — ABNORMAL HIGH (ref 6–20)
CHLORIDE: 127 mmol/L — AB (ref 101–111)
CO2: 28 mmol/L (ref 22–32)
CREATININE: 0.57 mg/dL (ref 0.44–1.00)
Calcium: 8.1 mg/dL — ABNORMAL LOW (ref 8.9–10.3)
GFR calc non Af Amer: >60 mL/min (ref >60)
Glucose, Bld: 148 mg/dL — ABNORMAL HIGH (ref 65–99)
Potassium: 3.8 mmol/L (ref 3.5–5.1)
SODIUM: 163 mmol/L — AB (ref 135–145)

## 2016-12-21 LAB — URINE CULTURE: Culture: >=100000 — AB

## 2016-12-21 LAB — CBC
HCT: 31.9 % — ABNORMAL LOW (ref 36.0–46.0)
Hemoglobin: 9.4 g/dL — ABNORMAL LOW (ref 12.0–15.0)
MCH: 25.4 pg — ABNORMAL LOW (ref 26.0–34.0)
MCHC: 29.5 g/dL — ABNORMAL LOW (ref 30.0–36.0)
MCV: 86.2 fL (ref 78.0–100.0)
PLATELETS: 92 10*3/uL — AB (ref 150–400)
RBC: 3.7 MIL/uL — ABNORMAL LOW (ref 3.87–5.11)
RDW: 17.3 % — ABNORMAL HIGH (ref 11.5–15.5)
WBC: 19.9 10*3/uL — ABNORMAL HIGH (ref 4.0–10.5)

## 2016-12-21 LAB — GLUCOSE, CAPILLARY
GLUCOSE-CAPILLARY: 144 mg/dL — AB (ref 65–99)
GLUCOSE-CAPILLARY: 137 mg/dL — AB (ref 65–99)
Glucose-Capillary: 129 mg/dL — ABNORMAL HIGH (ref 65–99)
Glucose-Capillary: 146 mg/dL — ABNORMAL HIGH (ref 65–99)
Glucose-Capillary: 162 mg/dL — ABNORMAL HIGH (ref 65–99)
Glucose-Capillary: 209 mg/dL — ABNORMAL HIGH (ref 65–99)

## 2016-12-21 LAB — PROCALCITONIN: Procalcitonin: 14.78 ng/mL

## 2016-12-21 MED ORDER — POTASSIUM CHLORIDE 2 MEQ/ML IV SOLN: INTRAVENOUS | Status: DC

## 2016-12-21 MED ORDER — POTASSIUM CHLORIDE IN NACL 20-0.9 MEQ/L-% IV SOLN
INTRAVENOUS | Status: DC
  Administered 2016-12-21 – 2016-12-22 (×2): via INTRAVENOUS

## 2016-12-21 MED ORDER — SODIUM CHLORIDE 0.9 % IV SOLN
1.0000 g | Freq: Two times a day (BID) | INTRAVENOUS | Status: DC
  Administered 2016-12-21 – 2016-12-28 (×15): 1 g via INTRAVENOUS
  Filled 2016-12-21 (×18): qty 1

## 2016-12-21 NOTE — Progress Notes (Signed)
PROGRESS NOTE  Ashlee Barron WUJ:[id number] DOB: 1928-07-23 DOA: 12/18/2016 PCP: No PCP Per Patient  Brief History:  83 year old woman admitted from her skilled nursing facility on 1/28 due to shortness of breath and fever. She has a history of advanced dementia and is basically bedbound prior to admission. She is a DO NOT RESUSCITATE. She is found to have gram positive and gram-negative bacteremia and ESBL Escherichia coli UTI.  The patient also has aspiration pneumonia. The patient's antibiotics were changed to meropenem on 12/21/2016. We have had multiple palliative care interventions with the family.   Assessment/Plan: Sepsis -Secondary to aspiration pneumonia, UTI and bacteremia -Discontinue vancomycin and cefepime -Start meropenem -Continue IV fluids -Lactic acid peaked at 7.0 -Procalcitonin 14.78  UTI -ESBL Escherichia coli -Start meropenem  Bacteremia--Escherichia coli -Continue meropenem as discussed -Viridans streptococcus likely a contaminant -Surveillance blood cultures in a.m.  Aspiration pneumonia -Long discussion with the patient's family -They appear to be somewhat accepting of the risks of aspiration, but at the same time ask about a "feeding tube" -Speech therapy evaluation -will continue to address concept of comfort feeding with family  Acute respiratory failure with hypoxia -Secondary to aspiration pneumonia -Continue supplemental oxygen  Hypernatremia -continue D5W--increase rate to 100 cc/hr  Hypokalemia -replete -check mag  Dementia without behavior disturbance   Disposition Plan:   In-hospital death vs. SNF with palliative services vs. Residential hospice Family Communication:   Family at bedside updated--Total time spent 35 minutes.  Greater than 50% spent face to face counseling and coordinating care.   Consultants:  Palliative medicine  Code Status:   DNR  DVT Prophylaxis:  [place] Lovenox   Procedures: As Listed in  Progress Note Above  Antibiotics: Merrem 12/21/16>>> Vanc/cefepime 1/28>>>1/31    Subjective: Patient is awake, but not answering questions appropriately. No respiratory distress, vomiting, diarrhea, uncontrolled pain. She does not follow commands.  Objective: Vitals:   12/19/16 1527 12/19/16 2128 12/20/16 0513 12/20/16 1417  BP: (!) 89/41 (!) 92/46 118/63 128/62  Pulse:  86 77 89  Resp:  15 13 15   Temp:  98.6 F (37 C) 98 F (36.7 C) 97.5 F (36.4 C)  TempSrc:  Oral Oral Axillary  SpO2:  100% 100% 100%  Weight:      Height:        Intake/Output Summary (Last 24 hours) at 12/21/16 1236 Last data filed at 12/21/16 0900  Gross per 24 hour  Intake          1862.92 ml  Output              500 ml  Net          1362.92 ml   Weight change:  Exam:   General:  Pt is alert, does not follow commands appropriately, not in acute distress  HEENT: No icterus, No thrush, No neck mass, [place]/AT  Cardiovascular: RRR, S1/S2, no rubs, no gallops  Respiratory: Bibasilar crackles. No wheezing. Good air movement.  Abdomen: Soft/+BS, non tender, non distended, no guarding  Extremities: trace LE edema, No lymphangitis, No petechiae, No rashes, no synovitis   Data Reviewed: I have personally reviewed following labs and imaging studies Basic Metabolic Panel:  Recent Labs Lab 12/18/16 2345 12/19/16 0435 12/20/16 0449 12/20/16 1214 12/21/16 0511  NA 161* 160* 163*  --  163*  K 3.2* 3.0* 2.6*  --  3.8  CL 121* 122* 129*  --  127*  CO2 30 26 26   --  28  GLUCOSE 472* 486* 223*  --  148*  BUN 38* 36* 36*  --  28*  CREATININE 0.81 0.81 0.63  --  0.57  CALCIUM 8.5* 8.1* 7.6*  --  8.1*  MG  --   --   --  1.9  --    Liver Function Tests:  Recent Labs Lab 12/18/16 2345  AST 16  ALT 10*  ALKPHOS 113  BILITOT 0.6  PROT 6.5  ALBUMIN 2.0*   No results for input(s): LIPASE, AMYLASE in the last 168 hours. No results for input(s): AMMONIA in the last 168 hours. Coagulation  Profile: No results for input(s): INR, PROTIME in the last 168 hours. CBC:  Recent Labs Lab 12/18/16 2345 12/21/16 0511  WBC 12.8* 19.9*  NEUTROABS 11.5*  --   HGB 10.9* 9.4*  HCT 36.5 31.9*  MCV 85.1 86.2  PLT 166 92*   Cardiac Enzymes: No results for input(s): CKTOTAL, CKMB, CKMBINDEX, TROPONINI in the last 168 hours. BNP: Invalid input(s): POCBNP CBG:  Recent Labs Lab 12/20/16 2100 12/21/16 0028 12/21/16 0427 12/21/16 0730 12/21/16 1138  GLUCAP 161* 162* 129* 144* 209*   HbA1C: No results for input(s): HGBA1C in the last 72 hours. Urine analysis:    Component Value Date/Time   COLORURINE YELLOW 12/18/2016 2345   APPEARANCEUR TURBID (A) 12/18/2016 2345   APPEARANCEUR Clear 12/18/2013 2115   LABSPEC 1.025 12/18/2016 2345   LABSPEC 1.008 12/18/2013 2115   PHURINE 5.0 12/18/2016 2345   GLUCOSEU >=500 (A) 12/18/2016 2345   GLUCOSEU Negative 12/18/2013 2115   HGBUR MODERATE (A) 12/18/2016 2345   BILIRUBINUR NEGATIVE 12/18/2016 2345   BILIRUBINUR Negative 12/18/2013 2115   KETONESUR NEGATIVE 12/18/2016 2345   PROTEINUR 100 (A) 12/18/2016 2345   UROBILINOGEN 0.2 05/11/2013 1501   NITRITE NEGATIVE 12/18/2016 2345   LEUKOCYTESUR MODERATE (A) 12/18/2016 2345   LEUKOCYTESUR Negative 12/18/2013 2115   Sepsis Labs: @LABRCNTIP (procalcitonin:4,lacticidven:4) ) Recent Results (from the past 240 hour(s))  Urine culture     Status: Abnormal   Collection Time: 12/18/16 11:45 PM  Result Value Ref Range Status   Specimen Description URINE, RANDOM  Final   Special Requests NONE  Final   Culture (A)  Final    >=100,000 COLONIES/mL ESCHERICHIA COLI Confirmed Extended Spectrum Beta-Lactamase Producer (ESBL) Performed at [facility], [address] [place], [place] [address]    Report Status 12/21/2016 FINAL  Final   Organism ID, Bacteria ESCHERICHIA COLI (A)  Final      Susceptibility   Escherichia coli - MIC*    AMPICILLIN >=32 RESISTANT Resistant      CEFAZOLIN >=64 RESISTANT Resistant     CEFTRIAXONE >=64 RESISTANT Resistant     CIPROFLOXACIN >=4 RESISTANT Resistant     GENTAMICIN <=1 SENSITIVE Sensitive     IMIPENEM <=0.25 SENSITIVE Sensitive     NITROFURANTOIN 128 RESISTANT Resistant     TRIMETH/SULFA >=320 RESISTANT Resistant     AMPICILLIN/SULBACTAM 16 INTERMEDIATE Intermediate     PIP/TAZO <=4 SENSITIVE Sensitive     Extended ESBL POSITIVE Resistant     * >=100,000 COLONIES/mL ESCHERICHIA COLI  Blood culture (routine x 2)     Status: Abnormal (Preliminary result)   Collection Time: 12/18/16 11:58 PM  Result Value Ref Range Status   Specimen Description BLOOD RIGHT ARM  Final   Special Requests BOTTLES DRAWN AEROBIC AND ANAEROBIC 5CC EACH  Final   Culture  Setup Time   Final    GRAM NEGATIVE RODS RECOVERED FROM  AEROBIC BOTTLE Gram Stain Report Called to,Read Back By and Verified With: VALDESE DILDY LPN AT [id number] BY HFLYNT 12/19/16 GRAM POSITIVE COCCI IN CHAINS RECOVERED FROM ANAEROBIC BOTTLE Gram Stain Report Called to,Read Back By and Verified With: ALSTON,C ON 12/19/16 AT 1930 BY LOY,C Organism ID to follow CRITICAL RESULT CALLED TO, READ BACK BY AND VERIFIED WITH: SHEENA HAMILTON,RN @0053  12/20/16 MKELLY,MLT Performed at [facility], [address] [place], [place] [address]    Culture ESCHERICHIA COLI VIRIDANS STREPTOCOCCUS  (A)  Final   Report Status PENDING  Incomplete  Blood Culture ID Panel (Reflexed)     Status: Abnormal   Collection Time: 12/18/16 11:58 PM  Result Value Ref Range Status   Enterococcus species NOT DETECTED NOT DETECTED Corrected   Listeria monocytogenes NOT DETECTED NOT DETECTED Corrected   Staphylococcus species NOT DETECTED NOT DETECTED Corrected   Staphylococcus aureus NOT DETECTED NOT DETECTED Corrected   Streptococcus species NOT DETECTED NOT DETECTED Corrected    Comment: CORRECTED ON 01/30 AT 0054: PREVIOUSLY REPORTED AS DETECTED CRITICAL RESULT CALLED TO, READ BACK BY AND VERIFIED  WITH: SHEENA HAMILTON,RN @0053  12/20/16 MKELLY,MLT   Streptococcus agalactiae NOT DETECTED NOT DETECTED Corrected   Streptococcus pneumoniae NOT DETECTED NOT DETECTED Corrected   Streptococcus pyogenes NOT DETECTED NOT DETECTED Corrected   Acinetobacter baumannii NOT DETECTED NOT DETECTED Corrected   Enterobacteriaceae species DETECTED (A) NOT DETECTED Corrected    Comment: CRITICAL RESULT CALLED TO, READ BACK BY AND VERIFIED WITH: SHEENA HAMILTON,RN @0053  12/20/16 MKELLY,MLT CORRECTED ON 01/30 AT 0054: PREVIOUSLY REPORTED AS NOT DETECTED    Enterobacter cloacae complex NOT DETECTED NOT DETECTED Corrected   Escherichia coli DETECTED (A) NOT DETECTED Corrected    Comment: CRITICAL RESULT CALLED TO, READ BACK BY AND VERIFIED WITH: SHEENA HAMILTON,RN @0053  12/20/16 MKELLY,MLT CORRECTED ON 01/30 AT 0054: PREVIOUSLY REPORTED AS NOT DETECTED    Klebsiella oxytoca NOT DETECTED NOT DETECTED Corrected   Klebsiella pneumoniae NOT DETECTED NOT DETECTED Corrected   Proteus species NOT DETECTED NOT DETECTED Corrected   Serratia marcescens NOT DETECTED NOT DETECTED Corrected   Carbapenem resistance NOT DETECTED NOT DETECTED Corrected   Haemophilus influenzae NOT DETECTED NOT DETECTED Corrected   Neisseria meningitidis NOT DETECTED NOT DETECTED Corrected   Pseudomonas aeruginosa NOT DETECTED NOT DETECTED Corrected   Candida albicans NOT DETECTED NOT DETECTED Corrected   Candida glabrata NOT DETECTED NOT DETECTED Corrected   Candida krusei NOT DETECTED NOT DETECTED Corrected   Candida parapsilosis NOT DETECTED NOT DETECTED Corrected   Candida tropicalis NOT DETECTED NOT DETECTED Corrected    Comment: Performed at [facility], [address] [place], [place] [address]  Blood culture (routine x 2)     Status: None (Preliminary result)   Collection Time: 12/19/16 12:10 AM  Result Value Ref Range Status   Specimen Description BLOOD RIGHT HAND  Final   Special Requests BOTTLES DRAWN AEROBIC ONLY  5CC ONLY  Final   Culture NO GROWTH 2 DAYS  Final   Report Status PENDING  Incomplete  MRSA PCR Screening     Status: None   Collection Time: 12/19/16 11:39 AM  Result Value Ref Range Status   MRSA by PCR NEGATIVE NEGATIVE Final    Comment:        The GeneXpert MRSA Assay (FDA approved for NASAL specimens only), is one component of a comprehensive MRSA colonization surveillance program. It is not intended to diagnose MRSA infection nor to guide or monitor treatment for MRSA infections.  Blood Culture ID Panel (Reflexed)     Status: Abnormal   Collection Time: 12/19/16 11:00 PM  Result Value Ref Range Status   Enterococcus species NOT DETECTED NOT DETECTED Final   Vancomycin resistance NOT DETECTED NOT DETECTED Final   Listeria monocytogenes NOT DETECTED NOT DETECTED Final   Staphylococcus species NOT DETECTED NOT DETECTED Final   Staphylococcus aureus NOT DETECTED NOT DETECTED Final   Methicillin resistance NOT DETECTED NOT DETECTED Final   Streptococcus species DETECTED (A) NOT DETECTED Final    Comment: CRITICAL RESULT CALLED TO, READ BACK BY AND VERIFIED WITH: SHEENA HAMILTON,RN @0053  12/20/16 MKELLY,MLT    Streptococcus agalactiae NOT DETECTED NOT DETECTED Final   Streptococcus pneumoniae NOT DETECTED NOT DETECTED Final   Streptococcus pyogenes NOT DETECTED NOT DETECTED Final   Acinetobacter baumannii NOT DETECTED NOT DETECTED Final   Enterobacteriaceae species NOT DETECTED NOT DETECTED Final   Enterobacter cloacae complex NOT DETECTED NOT DETECTED Final   Escherichia coli NOT DETECTED NOT DETECTED Final   Klebsiella oxytoca NOT DETECTED NOT DETECTED Final   Klebsiella pneumoniae NOT DETECTED NOT DETECTED Final   Proteus species NOT DETECTED NOT DETECTED Final   Serratia marcescens NOT DETECTED NOT DETECTED Final   Carbapenem resistance NOT DETECTED NOT DETECTED Final   Haemophilus influenzae NOT DETECTED NOT DETECTED Final   Neisseria meningitidis NOT DETECTED NOT  DETECTED Final   Pseudomonas aeruginosa NOT DETECTED NOT DETECTED Final   Candida albicans NOT DETECTED NOT DETECTED Final   Candida glabrata NOT DETECTED NOT DETECTED Final   Candida krusei NOT DETECTED NOT DETECTED Final   Candida parapsilosis NOT DETECTED NOT DETECTED Final   Candida tropicalis NOT DETECTED NOT DETECTED Final    Comment: Performed at [facility], [address], [place], [place] [address]     Scheduled Meds: . enoxaparin (LOVENOX) injection  40 mg Subcutaneous Q24H  . insulin aspart  0-9 Units Subcutaneous Q4H  . meropenem (MERREM) IV  1 g Intravenous Q12H   Continuous Infusions: . dextrose 100 mL/hr at 12/21/16 0920    Procedures/Studies: Dg Chest Portable 1 View  Result Date: 12/19/2016 CLINICAL DATA:  Sepsis and pneumonia. EXAM: PORTABLE CHEST 1 VIEW COMPARISON:  Single-view of the chest 12/18/2016 and 11/07/2016. FINDINGS: Bilateral mid and lower lung zone airspace disease does not appear changed compared to the most recent examination. Heart size is normal. No pneumothorax. Aortic atherosclerosis noted. IMPRESSION: No marked change in bilateral airspace disease most consistent with pneumonia. Atherosclerosis. Electronically Signed   By: Drusilla Kanner M.D.   On: 12/19/2016 07:23   Dg Chest Portable 1 View  Result Date: 12/19/2016 CLINICAL DATA:  Aortic atherosclerosis. EXAM: PORTABLE CHEST 1 VIEW COMPARISON:  11/07/2016 FINDINGS: Lungs are hypoinflated with heterogeneous airspace opacification over the right mid to lower lung and left base likely pneumonia. No evidence of effusion. Cardiomediastinal silhouette is within normal. There is minimal calcified plaque over the aortic arch. There are mild degenerative changes of the spine. IMPRESSION: Multifocal airspace process over the mid to lower lungs right worse than left likely pneumonia. Electronically Signed   By: Elberta Fortis M.D.   On: 12/19/2016 00:06    Obera Stauch, DO  [facility] Pager  [phone]  If 7PM-7AM, please contact night-coverage [email or web address] Password [email or web address] 12/21/2016, 12:36 PM   LOS: 2 days

## 2016-12-21 NOTE — Evaluation (Signed)
Clinical/Bedside Swallow Evaluation Patient Details  Name: Ashlee Barron MRN: [id number] Date of Birth: October 12, 1928  Today's Date: 12/21/2016 Time: SLP Start Time (ACUTE ONLY): 1315 SLP Stop Time (ACUTE ONLY): 1400 SLP Time Calculation (min) (ACUTE ONLY): 45 min  Past Medical History:  Past Medical History:  Diagnosis Date  . Constipation   . Dementia   . Diabetes mellitus without complication (HCC)   . Hypertension    Past Surgical History:  Past Surgical History:  Procedure Laterality Date  . ABDOMINAL HYSTERECTOMY    . APPENDECTOMY    . CHOLECYSTECTOMY     HPI: Ashlee Barron is a 83 y.o. female with medical history significant of advanced dementia, HTN, DM sent in from SNF for fever of 103 and worsening breathing.  Pt has advanced dementia, DNR paperwork with her.  Found to have sepsis with pna and uti.  Referred for admission for ivf and iv antiobiotics.  Family was called by dr Manus Gunningrancour who did not wish aggressive treatment, but good with ivf and abx. BSE ordered due to aspiration pneumonia to determine safest diet.      Assessment / Plan / Recommendation Clinical Impression   Pt was drowsy throughout session, opening eyes and speaking at times (unintelligible-baseline per daughter). Family present during evaluation (see notes below). Pt has dementia and direction following is significantly impaired-required full assist with all feeding tasks. Oral care revealed no residue. She was seen upright upright in bed for po trials. She consumed ice chips with no s/sx of aspiration with max cues for oral acceptance. She consumed thin water via spoon and cup with no coughing, noted multiple swallows; noted immediate and delayed cough with regular and small sips from straw.  Assessed family with liquid presentation as daughter requested SLP observe their method of presenting straw sips (very small sips), and delayed coughing noted despite daughter's verbal cues to accept liquids and take  small sips. She consumed applesauce with strong oral acceptance, noted multiple swallows, no coughing. She then consumed graham cracker with applesauce with prolonged, labored mastication requiring oral care to remove oral cavity residue. Family describes [facility] and [facility] (which was also recommended by SLP following BSE in 10/2017). SLP recommends thin liquids (no straws), puree, oral care before and after meals, follow precautions posted above HOB, crush meds in puree - educated nursing and family about precautions. Plan to follow while on acute for diet tolerance and family education.     Aspiration Risk  Moderate aspiration risk    Diet Recommendation Dysphagia 1 (Puree);Thin liquid;Other (Comment) (no straws)   Liquid Administration via: Spoon;Cup;No straw Medication Administration: Crushed with puree Supervision: Full supervision/cueing for compensatory strategies Compensations: Slow rate;Small sips/bites Postural Changes: Seated upright at 90 degrees;Remain upright for at least 30 minutes after po intake    Other  Recommendations Recommended Consults: Other (Comment) (nutrition consult) Oral Care Recommendations: Oral care before and after PO;Oral care BID   Follow up Recommendations Skilled Nursing facility      Frequency and Duration min 2x/week  1 week       Prognosis Prognosis for Safe Diet Advancement: Guarded Barriers to Reach Goals: Cognitive deficits;Severity of deficits      Swallow Study   General Date of Onset: 12/18/16 Type of Study: Bedside Swallow Evaluation Diet Prior to this Study: NPO Temperature Spikes Noted: Yes (ED NOTES) Respiratory Status: Nasal cannula History of Recent Intubation: No Behavior/Cognition: Lethargic/Drowsy Oral Cavity Assessment: Within Functional Limits Oral Care Completed by SLP:  Yes Oral Cavity - Dentition: Missing dentition;Other (Comment) (dentition present, bottom front only ) Self-Feeding Abilities: Needs  assist Patient Positioning: Upright in bed Baseline Vocal Quality: Normal Volitional Cough: Cognitively unable to elicit Volitional Swallow: Unable to elicit    Oral/Motor/Sensory Function Overall Oral Motor/Sensory Function: Within functional limits   Ice Chips Ice chips: Within functional limits   Thin Liquid Thin Liquid: Impaired Presentation: Cup;Spoon;Straw Oral Phase Impairments: Reduced labial seal Pharyngeal  Phase Impairments: Cough - Immediate;Cough - Delayed;Multiple swallows (with straws)    Nectar Thick     Honey Thick     Puree Puree: Impaired Presentation: Spoon Pharyngeal Phase Impairments: Multiple swallows   Solid   Thank you,   Danella Maiers. Orvan Falconer, MS, CCC-SLP  Speech-Language Pathologist [phone]       Solid: Impaired Presentation: Spoon Oral Phase Impairments: Reduced labial seal;Reduced lingual movement/coordination Oral Phase Functional Implications: Impaired mastication        Addison Naegeli 12/21/2016,3:09 PM

## 2016-12-21 NOTE — Progress Notes (Signed)
CRITICAL VALUE ALERT  Critical value received:  Na 163  Date of notification:  12/21/16  Time of notification:  0700  Critical value read back:Yes.    Nurse who received alert:  Elby BeckAlicia Mebane  MD notified (1st page):  Dr. Arbutus Leasat  Time of first page:  0725  MD notified (2nd page):  Time of second page:  Responding MD:  Dr. Arbutus Leasat  Time MD responded:  0730

## 2016-12-21 NOTE — Progress Notes (Signed)
PHARMACY NOTE:  ANTIMICROBIAL RENAL DOSAGE ADJUSTMENT  Current antimicrobial regimen includes a mismatch between antimicrobial dosage and estimated renal function.  As per policy approved by the Pharmacy & Therapeutics and Medical Executive Committees, the antimicrobial dosage will be adjusted accordingly.  Current antimicrobial dosage:  Meropenem 1gm IV every 8 hours.  Indication: ESBL E. Coli UTI  Renal Function:  Estimated Creatinine Clearance: 42.5 mL/min (by C-G formula based on SCr of 0.57 mg/dL). []      On intermittent HD, scheduled: []      On CRRT    Antimicrobial dosage has been changed to:  Meropenem 1gm IV every 12 hours.  Additional comments:   Thank you for allowing pharmacy to be a part of this patient's care.  Mady GemmaHayes, Marvie Brevik R, [facility] 12/21/2016 8:54 AM

## 2016-12-22 DIAGNOSIS — D696 Thrombocytopenia, unspecified: Secondary | ICD-10-CM

## 2016-12-22 DIAGNOSIS — Z515 Encounter for palliative care: Secondary | ICD-10-CM

## 2016-12-22 DIAGNOSIS — B9620 Unspecified Escherichia coli [E. coli] as the cause of diseases classified elsewhere: Secondary | ICD-10-CM

## 2016-12-22 DIAGNOSIS — J9600 Acute respiratory failure, unspecified whether with hypoxia or hypercapnia: Secondary | ICD-10-CM

## 2016-12-22 DIAGNOSIS — A419 Sepsis, unspecified organism: Principal | ICD-10-CM

## 2016-12-22 DIAGNOSIS — L89150 Pressure ulcer of sacral region, unstageable: Secondary | ICD-10-CM

## 2016-12-22 DIAGNOSIS — R7881 Bacteremia: Secondary | ICD-10-CM

## 2016-12-22 DIAGNOSIS — E870 Hyperosmolality and hypernatremia: Secondary | ICD-10-CM

## 2016-12-22 DIAGNOSIS — G9340 Encephalopathy, unspecified: Secondary | ICD-10-CM

## 2016-12-22 DIAGNOSIS — Z7189 Other specified counseling: Secondary | ICD-10-CM

## 2016-12-22 DIAGNOSIS — E860 Dehydration: Secondary | ICD-10-CM

## 2016-12-22 LAB — GLUCOSE, CAPILLARY
GLUCOSE-CAPILLARY: 131 mg/dL — AB (ref 65–99)
GLUCOSE-CAPILLARY: 149 mg/dL — AB (ref 65–99)
GLUCOSE-CAPILLARY: 157 mg/dL — AB (ref 65–99)
GLUCOSE-CAPILLARY: 182 mg/dL — AB (ref 65–99)
GLUCOSE-CAPILLARY: 183 mg/dL — AB (ref 65–99)
GLUCOSE-CAPILLARY: 269 mg/dL — AB (ref 65–99)

## 2016-12-22 LAB — CULTURE, BLOOD (ROUTINE X 2)

## 2016-12-22 LAB — CBC
HEMATOCRIT: 25.9 % — AB (ref 36.0–46.0)
Hemoglobin: 8 g/dL — ABNORMAL LOW (ref 12.0–15.0)
MCH: 25.4 pg — AB (ref 26.0–34.0)
MCHC: 30.9 g/dL (ref 30.0–36.0)
MCV: 82.2 fL (ref 78.0–100.0)
PLATELETS: 63 10*3/uL — AB (ref 150–400)
RBC: 3.15 MIL/uL — AB (ref 3.87–5.11)
RDW: 17.1 % — ABNORMAL HIGH (ref 11.5–15.5)
WBC: 15.3 10*3/uL — ABNORMAL HIGH (ref 4.0–10.5)

## 2016-12-22 LAB — MAGNESIUM: Magnesium: 1.6 mg/dL — ABNORMAL LOW (ref 1.7–2.4)

## 2016-12-22 MED ORDER — DONEPEZIL HCL 5 MG PO TABS
10.0000 mg | ORAL_TABLET | Freq: Every day | ORAL | Status: DC
  Administered 2016-12-22 – 2016-12-23 (×2): 10 mg via ORAL
  Filled 2016-12-22 (×3): qty 2

## 2016-12-22 MED ORDER — DAKINS (1/4 STRENGTH) 0.125 % EX SOLN
Freq: Every morning | CUTANEOUS | Status: AC
  Administered 2016-12-22 – 2016-12-23 (×2): via TOPICAL
  Administered 2016-12-24: 1 via TOPICAL
  Filled 2016-12-22: qty 473

## 2016-12-22 MED ORDER — MAGNESIUM SULFATE 2 GM/50ML IV SOLN
2.0000 g | Freq: Once | INTRAVENOUS | Status: AC
  Administered 2016-12-22: 2 g via INTRAVENOUS
  Filled 2016-12-22: qty 50

## 2016-12-22 MED ORDER — HALOPERIDOL LACTATE 5 MG/ML IJ SOLN
2.0000 mg | Freq: Four times a day (QID) | INTRAMUSCULAR | Status: DC | PRN
  Administered 2016-12-24 – 2016-12-25 (×2): 2 mg via INTRAVENOUS
  Filled 2016-12-22 (×2): qty 1

## 2016-12-22 NOTE — Progress Notes (Signed)
Daily Progress Note   Patient Name: Ashlee Barron       Date: 12/22/2016 DOB: February 01, 1928  Age: 83 y.o. MRN#: [id number] Attending Physician: Catarina Hartshorn, MD Primary Care Physician: No PCP Per Patient Admit Date: 12/18/2016  Reason for Consultation/Follow-up: Disposition, Establishing goals of care and Psychosocial/spiritual support  Subjective: Ashlee Barron is resting quietly in bed. Although she looks improved from yesterday, she is still very ill. Family sees Ashlee Barron's improvement as substantial, and a sign that she will return to her prior state of health (which has declined since November per Shon Hale).  [name] and I talk in the hallway. He talks about her sacral wounds. He shares that family was not aware of these wounds.  Shon Hale tells me that [facility] told him they were not aware of any wounds, they must've occurred in the hospital. Shon Hale states that he is considering seeking legal advice about these wounds. I share with Shon Hale that skin breakdown is part of the chronic illness trajectory of dementia, and is expected as people have decreased mobility decreased nutritional status, and are nearing end-of-life.  He states that he and his sister/cousin [name], are unsure if they wish for Ashlee Barron to return to [facility]. He states that family is considering placing her in a SNF. He shares that he will discuss this with [name]'s brother, as they need financial support. I advise Shon Hale that social worker needs to know as soon as possible if they are considering moving Ashlee Barron from ALF to SNF, and their choice of facility. I share that there must be time to request a bed, and be accepted.    Length of Stay: 3  Current Medications: Scheduled Meds:  . donepezil  10 mg Oral  QHS  . meropenem (MERREM) IV  1 g Intravenous Q12H  . sodium hypochlorite   Topical q morning - 10a    Continuous Infusions: . 0.9 % NaCl with KCl 20 mEq / L 100 mL/hr at 12/21/16 1340    PRN Meds: haloperidol lactate  Physical Exam  Constitutional: No distress.  Frail and thin, chronically ill appearing  HENT:  Head: Normocephalic and atraumatic.  Cardiovascular: Normal rate and regular rhythm.   Pulmonary/Chest: Effort normal. No respiratory distress.  Abdominal: Soft. She exhibits no distension.  Musculoskeletal:  She exhibits no edema.  Neurological:  More wakeful today, demented  Skin: Skin is warm and dry.  End-stage bull sacral wound, bilateral medial knee blisters, heel wound  Nursing note and vitals reviewed.           Vital Signs: BP (!) 129/49 (BP Location: Right Arm)   Pulse 81   Temp 99.3 F (37.4 C) (Axillary)   Resp 16   Ht 5\' 4"  (1.626 m)   Wt 54.4 kg (119 lb 14.9 oz)   SpO2 99%   BMI 20.59 kg/m  SpO2: SpO2: 99 % O2 Device: O2 Device: Nasal Cannula O2 Flow Rate: O2 Flow Rate (L/min): 3 L/min  Intake/output summary:  Intake/Output Summary (Last 24 hours) at 12/22/16 1538 Last data filed at 12/22/16 1423  Gross per 24 hour  Intake             2670 ml  Output             1450 ml  Net             1220 ml   LBM: Last BM Date: 12/21/16 Baseline Weight: Weight: 54.4 kg (120 lb) Most recent weight: Weight: 54.4 kg (119 lb 14.9 oz)       Palliative Assessment/Data:    Flowsheet Rows   Flowsheet Row Most Recent Value  Intake Tab  Referral Department  Hospitalist  Unit at Time of Referral  ER  Palliative Care Primary Diagnosis  Neurology  Date Notified  12/19/16  Palliative Care Type  Return patient Palliative Care  Reason for referral  Clarify Goals of Care  Date of Admission  12/18/16  # of days IP prior to Palliative referral  1  Clinical Assessment  Palliative Performance Scale Score  20%  Pain Max last 24 hours  Not able to report    Pain Min Last 24 hours  Not able to report  Dyspnea Max Last 24 Hours  Not able to report  Dyspnea Min Last 24 hours  Not able to report  Psychosocial & Spiritual Assessment  Palliative Care Outcomes  Patient/Family meeting held?  Yes  Who was at the meeting?  Daughter [name], son [name].   Patient/Family wishes: Interventions discontinued/not started   Mechanical Ventilation      Patient Active Problem List   Diagnosis Date Noted  . Bacteremia due to Escherichia coli 12/22/2016  . Thrombocytopenia (HCC) 12/22/2016  . Acute encephalopathy 12/22/2016  . Gram-negative bacteremia 12/20/2016  . Gram-positive bacteremia 12/20/2016  . Sepsis (HCC) 12/19/2016  . Acute respiratory failure (HCC) 12/19/2016  . Hospital acquired PNA 12/19/2016  . Urinary tract infection with hematuria 12/19/2016  . Hypernatremia 12/19/2016  . Pressure injury of skin 12/19/2016  . HCAP (healthcare-associated pneumonia)   . Palliative care encounter   . Goals of care, counseling/discussion   . Hypoalbuminemia 11/01/2016  . Edema of left foot 11/01/2016  . Acute cystitis without hematuria   . Sepsis secondary to UTI (HCC) 10/31/2016  . Type 2 diabetes mellitus (HCC) 10/31/2016  . Dementia 10/31/2016  . Essential hypertension 10/31/2016  . Aspiration pneumonia (HCC) 10/31/2016  . Abnormal EKG 10/31/2016  . Altered mental status 10/19/2016  . Constipation 09/09/2016  . Dehydration 09/09/2016  . Diabetes (HCC) 09/09/2016    Palliative Care Assessment & Plan   Patient Profile: 83 y.o.femalewith past medical history of Dementia with declines over the last 2 months, diabetes, hypertension, constipationadmitted on 1/28/2018with sepsis, Found to have 3 bacteria positive blood culture  Assessment: sepsis; IV fluids and IV antibiotics, complicated by positive blood cultures. End stage dementia; patient is bedbound, under the care of [facility] hosp[facility] at this time. Family states that their goal  of bringing Ashlee Barron to the hospital was to see if she had something that was reversible. We continue to discuss the chronic illness trajectory related to changes for dementia. Today Shon Hale and I discussed the normal changes related to skin breakdown with immobility related to dementia.  Recommendations/Plan:  families desire is to continue to treat the treatable, continue with IV fluids and IV antibiotics. Family's goal is for Mrs. Levan to continue improving, return to their prior state of health. They await the arrival of cousin Tobi Bastos, on Friday.  Goals of Care and Additional Recommendations:  Limitations on Scope of Treatment: Continue to treat the treatable, IV fluids and antibiotics,CPR or intimidation.  Code Status:    Code Status Orders        Start     Ordered   12/19/16 1701  Do not attempt resuscitation (DNR)  Continuous    Question Answer Comment  In the event of cardiac or respiratory ARREST Do not call a "code blue"   In the event of cardiac or respiratory ARREST Do not perform Intubation, CPR, defibrillation or ACLS   In the event of cardiac or respiratory ARREST Use medication by any route, position, wound care, and other measures to relive pain and suffering. May use oxygen, suction and manual treatment of airway obstruction as needed for comfort.      12/19/16 1700    Code Status History    Date Active Date Inactive Code Status Order ID Comments User Context   12/19/2016  3:59 AM 12/19/2016  5:00 PM DNR [id number]  Glynn Octave, MD ED   10/31/2016  8:16 PM 11/05/2016  5:14 PM DNR [id number]  Bobette Mo, MD Inpatient   10/20/2016 12:31 AM 10/21/2016  8:18 PM DNR [id number]  Pearson Grippe, MD Inpatient   09/09/2016  8:06 PM 09/12/2016  4:08 PM Full Code [id number]  Pearson Grippe, MD Inpatient    Advance Directive Documentation   Flowsheet Row Most Recent Value  Type of Advance Directive  Out of facility DNR (pink MOST or yellow form)  Pre-existing out of facility DNR  order (yellow form or pink MOST form)  No data  "MOST" Form in Place?  No data       Prognosis:   < 4 weeks would not be surprising. Although Mrs. Staron is improving with IV fluids and antibiotics, she is clearly declining with functional status, nutritional status.  Based on 3 hospitalizations in the last 6 months functional decline over the last 2 months, frailty, sepsis, now complicated with 3 bacteria in her blood.  Discharge Planning:  Son/nephew, Milus Banister, states today that they are considering choosing SNF for Mrs. Thoma, not returning to [facility].  I share the importance of letting social worker know as soon as possible of their choice  Care plan was discussed with nursing staff, case manager, social worker, and Dr. Arbutus Leas on next rounds  Thank you for allowing the Palliative Medicine Team to assist in the care of this patient.   Time In: 1335  Time Out: 1400  Total Time 25 minutes  Prolonged Time Billed  no       Greater than 50%  of this time was spent counseling and coordinating care related to the above assessment and plan.  Katheran Awe, NP  Please contact Palliative Medicine Team phone at [phone] for questions and concerns.

## 2016-12-22 NOTE — Progress Notes (Signed)
PROGRESS NOTE  Ashlee Barron ZOX:[id number] DOB: 16-Oct-1928 DOA: 12/18/2016 PCP: No PCP Per Patient  Brief History:  83 year old woman admitted from her skilled nursing facility on 1/28 due to shortness of breath, obtundation, and fever. She has a history of advanced dementia and is basically bedbound prior to admission. She is a DO NOT RESUSCITATE. She is found to have gram positive and ESBL EColi bacteremia and ESBL Escherichia coli UTI.  The patient also has aspiration pneumonia. The patient's antibiotics were changed to meropenem on 12/21/2016. We have had multiple palliative care interventions with the family.   Assessment/Plan: Sepsis -Secondary to aspiration pneumonia, UTI and bacteremia -Discontinue vancomycin and cefepime -Start meropenem -Continue IV fluids -Lactic acid peaked at 7.0 -Procalcitonin 14.78  UTI -ESBL Escherichia coli -Continue meropenem  Bacteremia--Escherichia coli -Continue meropenem as discussed -Viridans streptococcus likely a contaminant -Surveillance blood cultures--follow -Will need 14 days of IV antibiotics from the last negative culture  Aspiration pneumonia -Long discussion with the patient's family -They appear to be somewhat accepting of the risks of aspiration, but at the same time ask about a "feeding tube" -Speech therapy evaluation appreciated--> dysphagia 1 diet with thin liquids -will continue to address concept of comfort feeding with family  Acute respiratory failure with hypoxia -Secondary to aspiration pneumonia -Continue supplemental oxygen--stable on 3 L  Acute metabolic encephalopathy -Secondary to infectious process -Gradually improving  Hypernatremia -continue D5W--increase rate to 100 cc/hr -am BMP  Hypokalemia/hypomagnesemia -replete -check mag  Thrombocytopenia -likely due to sepsis -check coags -check fibrinogen  Dementia without behavior disturbance -haldol prn agitation  Stage III  sacral decubitus ulcer -consult wound care nurse -present at time of admission  GOC -maintain DNR -family has unrealistic expectations--they continue to state that "the patient is a IT sales professionalfighter, 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  Greater than 50% spent face to face counseling and coordinating care.   Consultants:  Palliative medicine  Code Status:   DNR  DVT Prophylaxis:  SCDs   Procedures: As Listed in Progress Note Above  Antibiotics: Merrem 12/21/16>>> Vanc/cefepime 1/28>>>1/31     Subjective: Patient is more weight, but does not follow commands. She occasionally mumbles yes or no. She denies any chest pain, shortness breath, abdominal pain. No reports of vomiting.  Objective: Vitals:   12/21/16 1609 12/21/16 2100 12/21/16 2146 12/22/16 0414  BP: (!) 146/74 (!) 109/51  (!) 129/49  Pulse: 97 100  81  Resp: 16 16  16   Temp: 97.6 F (36.4 C) 99 F (37.2 C)  99.3 F (37.4 C)  TempSrc:  Axillary  Axillary  SpO2: 96% 100% 99% 99%  Weight:      Height:        Intake/Output Summary (Last 24 hours) at 12/22/16 1314 Last data filed at 12/22/16 0619  Gross per 24 hour  Intake           291.67 ml  Output             1450 ml  Net         -1158.33 ml   Weight change:  Exam:   General:  Pt is alert, does not follow commands appropriately, not in acute distress  HEENT: No icterus, No thrush, No neck mass, [place]/AT  Cardiovascular: RRR, S1/S2, no rubs, no gallops  Respiratory: Bibasilar crackles, right greater than left. No wheezing. Good air movement.  Abdomen: Soft/+BS, non tender, non distended,  no guarding  Extremities: No edema, No lymphangitis, No petechiae, No rashes, no synovitis   Data Reviewed: I have personally reviewed following labs and imaging studies Basic Metabolic Panel:  Recent Labs Lab 12/18/16 2345 12/19/16 0435 12/20/16 0449 12/20/16 1214 12/21/16 0511 12/22/16 0551  NA 161* 160* 163*  --  163*  --   K 3.2* 3.0* 2.6*  --  3.8  --   CL 121* 122* 129*  --  127*  --   CO2 30 26 26   --  28  --   GLUCOSE 472* 486* 223*  --  148*  --   BUN 38* 36* 36*  --  28*  --   CREATININE 0.81 0.81 0.63  --  0.57  --   CALCIUM 8.5* 8.1* 7.6*  --  8.1*  --   MG  --   --   --  1.9  --  1.6*   Liver Function Tests:  Recent Labs Lab 12/18/16 2345  AST 16  ALT 10*  ALKPHOS 113  BILITOT 0.6  PROT 6.5  ALBUMIN 2.0*   No results for input(s): LIPASE, AMYLASE in the last 168 hours. No results for input(s): AMMONIA in the last 168 hours. Coagulation Profile: No results for input(s): INR, PROTIME in the last 168 hours. CBC:  Recent Labs Lab 12/18/16 2345 12/21/16 0511 12/22/16 0551  WBC 12.8* 19.9* 15.3*  NEUTROABS 11.5*  --   --   HGB 10.9* 9.4* 8.0*  HCT 36.5 31.9* 25.9*  MCV 85.1 86.2 82.2  PLT 166 92* 63*   Cardiac Enzymes: No results for input(s): CKTOTAL, CKMB, CKMBINDEX, TROPONINI in the last 168 hours. BNP: Invalid input(s): POCBNP CBG:  Recent Labs Lab 12/21/16 1943 12/22/16 0009 12/22/16 0358 12/22/16 0801 12/22/16 1146  GLUCAP 137* 149* 157* 131* 182*   HbA1C: No results for input(s): HGBA1C in the last 72 hours. Urine analysis:    Component Value Date/Time   COLORURINE YELLOW 12/18/2016 2345   APPEARANCEUR TURBID (A) 12/18/2016 2345   APPEARANCEUR Clear 12/18/2013 2115   LABSPEC 1.025 12/18/2016 2345   LABSPEC 1.008 12/18/2013 2115   PHURINE 5.0 12/18/2016 2345   GLUCOSEU >=500 (A) 12/18/2016 2345   GLUCOSEU Negative 12/18/2013 2115   HGBUR MODERATE (A) 12/18/2016 2345   BILIRUBINUR NEGATIVE 12/18/2016 2345   BILIRUBINUR  Negative 12/18/2013 2115   KETONESUR NEGATIVE 12/18/2016 2345   PROTEINUR 100 (A) 12/18/2016 2345   UROBILINOGEN 0.2 05/11/2013 1501   NITRITE NEGATIVE 12/18/2016 2345   LEUKOCYTESUR MODERATE (A) 12/18/2016 2345   LEUKOCYTESUR Negative 12/18/2013 2115   Sepsis Labs: @LABRCNTIP (procalcitonin:4,lacticidven:4) ) Recent Results (from the past 240 hour(s))  Urine culture     Status: Abnormal   Collection Time: 12/18/16 11:45 PM  Result Value Ref Range Status   Specimen Description URINE, RANDOM  Final   Special Requests NONE  Final   Culture (A)  Final    >=100,000 COLONIES/mL ESCHERICHIA COLI Confirmed Extended Spectrum Beta-Lactamase Producer (ESBL) Performed at [facility], [address], [place], [place] [address]    Report Status 12/21/2016 FINAL  Final   Organism ID, Bacteria ESCHERICHIA COLI (A)  Final      Susceptibility  Escherichia coli - MIC*    AMPICILLIN >=32 RESISTANT Resistant     CEFAZOLIN >=64 RESISTANT Resistant     CEFTRIAXONE >=64 RESISTANT Resistant     CIPROFLOXACIN >=4 RESISTANT Resistant     GENTAMICIN <=1 SENSITIVE Sensitive     IMIPENEM <=0.25 SENSITIVE Sensitive     NITROFURANTOIN 128 RESISTANT Resistant     TRIMETH/SULFA >=320 RESISTANT Resistant     AMPICILLIN/SULBACTAM 16 INTERMEDIATE Intermediate     PIP/TAZO <=4 SENSITIVE Sensitive     Extended ESBL POSITIVE Resistant     * >=100,000 COLONIES/mL ESCHERICHIA COLI  Blood culture (routine x 2)     Status: Abnormal   Collection Time: 12/18/16 11:58 PM  Result Value Ref Range Status   Specimen Description BLOOD RIGHT ARM  Final   Special Requests BOTTLES DRAWN AEROBIC AND ANAEROBIC 5CC EACH  Final   Culture  Setup Time   Final    GRAM NEGATIVE RODS RECOVERED FROM AEROBIC BOTTLE Gram Stain Report Called to,Read Back By and Verified With: VALDESE DILDY LPN AT 1610 BY HFLYNT 12/19/16 GRAM POSITIVE COCCI IN CHAINS RECOVERED FROM ANAEROBIC BOTTLE Gram Stain Report Called to,Read Back By and  Verified With: ALSTON,C ON 12/19/16 AT 1930 BY LOY,C CRITICAL RESULT CALLED TO, READ BACK BY AND VERIFIED WITH: SHEENA HAMILTON,RN @0053  12/20/16 MKELLY,MLT    Culture (A)  Final    ESCHERICHIA COLI Confirmed Extended Spectrum Beta-Lactamase Producer (ESBL) VIRIDANS STREPTOCOCCUS THE SIGNIFICANCE OF ISOLATING THIS ORGANISM FROM A SINGLE SET OF BLOOD CULTURES WHEN MULTIPLE SETS ARE DRAWN IS UNCERTAIN. PLEASE NOTIFY THE MICROBIOLOGY DEPARTMENT WITHIN ONE WEEK IF SPECIATION AND SENSITIVITIES ARE REQUIRED. Performed at [facility], [address], [place], [place] [address]    Report Status 12/22/2016 FINAL  Final   Organism ID, Bacteria ESCHERICHIA COLI  Final      Susceptibility   Escherichia coli - MIC*    AMPICILLIN >=32 RESISTANT Resistant     CEFAZOLIN >=64 RESISTANT Resistant     CEFEPIME 2 RESISTANT Resistant     CEFTAZIDIME 16 RESISTANT Resistant     CEFTRIAXONE >=64 RESISTANT Resistant     CIPROFLOXACIN >=4 RESISTANT Resistant     GENTAMICIN <=1 SENSITIVE Sensitive     IMIPENEM <=0.25 SENSITIVE Sensitive     TRIMETH/SULFA >=320 RESISTANT Resistant     AMPICILLIN/SULBACTAM 16 INTERMEDIATE Intermediate     PIP/TAZO <=4 SENSITIVE Sensitive     Extended ESBL POSITIVE Resistant     * ESCHERICHIA COLI  Blood Culture ID Panel (Reflexed)     Status: Abnormal   Collection Time: 12/18/16 11:58 PM  Result Value Ref Range Status   Enterococcus species NOT DETECTED NOT DETECTED Corrected   Listeria monocytogenes NOT DETECTED NOT DETECTED Corrected   Staphylococcus species NOT DETECTED NOT DETECTED Corrected   Staphylococcus aureus NOT DETECTED NOT DETECTED Corrected   Streptococcus species NOT DETECTED NOT DETECTED Corrected    Comment: CORRECTED ON 01/30 AT 0054: PREVIOUSLY REPORTED AS DETECTED CRITICAL RESULT CALLED TO, READ BACK BY AND VERIFIED WITH: SHEENA HAMILTON,RN @0053  12/20/16 MKELLY,MLT   Streptococcus agalactiae NOT DETECTED NOT DETECTED Corrected   Streptococcus  pneumoniae NOT DETECTED NOT DETECTED Corrected   Streptococcus pyogenes NOT DETECTED NOT DETECTED Corrected   Acinetobacter baumannii NOT DETECTED NOT DETECTED Corrected   Enterobacteriaceae species DETECTED (A) NOT DETECTED Corrected    Comment: CRITICAL RESULT CALLED TO, READ BACK BY AND VERIFIED WITH: SHEENA HAMILTON,RN @0053  12/20/16 MKELLY,MLT CORRECTED ON 01/30 AT 0054: PREVIOUSLY REPORTED AS NOT DETECTED  Enterobacter cloacae complex NOT DETECTED NOT DETECTED Corrected   Escherichia coli DETECTED (A) NOT DETECTED Corrected    Comment: CRITICAL RESULT CALLED TO, READ BACK BY AND VERIFIED WITH: SHEENA HAMILTON,RN @0053  12/20/16 MKELLY,MLT CORRECTED ON 01/30 AT 0054: PREVIOUSLY REPORTED AS NOT DETECTED    Klebsiella oxytoca NOT DETECTED NOT DETECTED Corrected   Klebsiella pneumoniae NOT DETECTED NOT DETECTED Corrected   Proteus species NOT DETECTED NOT DETECTED Corrected   Serratia marcescens NOT DETECTED NOT DETECTED Corrected   Carbapenem resistance NOT DETECTED NOT DETECTED Corrected   Haemophilus influenzae NOT DETECTED NOT DETECTED Corrected   Neisseria meningitidis NOT DETECTED NOT DETECTED Corrected   Pseudomonas aeruginosa NOT DETECTED NOT DETECTED Corrected   Candida albicans NOT DETECTED NOT DETECTED Corrected   Candida glabrata NOT DETECTED NOT DETECTED Corrected   Candida krusei NOT DETECTED NOT DETECTED Corrected   Candida parapsilosis NOT DETECTED NOT DETECTED Corrected   Candida tropicalis NOT DETECTED NOT DETECTED Corrected    Comment: Performed at [facility], [address], [place], [place] [address]  Blood culture (routine x 2)     Status: None (Preliminary result)   Collection Time: 12/19/16 12:10 AM  Result Value Ref Range Status   Specimen Description BLOOD RIGHT HAND  Final   Special Requests BOTTLES DRAWN AEROBIC ONLY 5CC ONLY  Final   Culture NO GROWTH 3 DAYS  Final   Report Status PENDING  Incomplete  MRSA PCR Screening     Status: None    Collection Time: 12/19/16 11:39 AM  Result Value Ref Range Status   MRSA by PCR NEGATIVE NEGATIVE Final    Comment:        The GeneXpert MRSA Assay (FDA approved for NASAL specimens only), is one component of a comprehensive MRSA colonization surveillance program. It is not intended to diagnose MRSA infection nor to guide or monitor treatment for MRSA infections.   Blood Culture ID Panel (Reflexed)     Status: Abnormal   Collection Time: 12/19/16 11:00 PM  Result Value Ref Range Status   Enterococcus species NOT DETECTED NOT DETECTED Final   Vancomycin resistance NOT DETECTED NOT DETECTED Final   Listeria monocytogenes NOT DETECTED NOT DETECTED Final   Staphylococcus species NOT DETECTED NOT DETECTED Final   Staphylococcus aureus NOT DETECTED NOT DETECTED Final   Methicillin resistance NOT DETECTED NOT DETECTED Final   Streptococcus species DETECTED (A) NOT DETECTED Final    Comment: CRITICAL RESULT CALLED TO, READ BACK BY AND VERIFIED WITH: SHEENA HAMILTON,RN @0053  12/20/16 MKELLY,MLT    Streptococcus agalactiae NOT DETECTED NOT DETECTED Final   Streptococcus pneumoniae NOT DETECTED NOT DETECTED Final   Streptococcus pyogenes NOT DETECTED NOT DETECTED Final   Acinetobacter baumannii NOT DETECTED NOT DETECTED Final   Enterobacteriaceae species NOT DETECTED NOT DETECTED Final   Enterobacter cloacae complex NOT DETECTED NOT DETECTED Final   Escherichia coli NOT DETECTED NOT DETECTED Final   Klebsiella oxytoca NOT DETECTED NOT DETECTED Final   Klebsiella pneumoniae NOT DETECTED NOT DETECTED Final   Proteus species NOT DETECTED NOT DETECTED Final   Serratia marcescens NOT DETECTED NOT DETECTED Final   Carbapenem resistance NOT DETECTED NOT DETECTED Final   Haemophilus influenzae NOT DETECTED NOT DETECTED Final   Neisseria meningitidis NOT DETECTED NOT DETECTED Final   Pseudomonas aeruginosa NOT DETECTED NOT DETECTED Final   Candida albicans NOT DETECTED NOT DETECTED Final    Candida glabrata NOT DETECTED NOT DETECTED Final   Candida krusei NOT DETECTED NOT DETECTED Final  Candida parapsilosis NOT DETECTED NOT DETECTED Final   Candida tropicalis NOT DETECTED NOT DETECTED Final    Comment: Performed at [facility], [address] [place], [place] [address]  Culture, blood (Routine X 2) w Reflex to ID Panel     Status: None (Preliminary result)   Collection Time: 12/22/16  9:14 AM  Result Value Ref Range Status   Specimen Description BLOOD RIGHT ARM  Final   Special Requests BOTTLES DRAWN AEROBIC AND ANAEROBIC 10CC  Final   Culture PENDING  Incomplete   Report Status PENDING  Incomplete  Culture, blood (Routine X 2) w Reflex to ID Panel     Status: None (Preliminary result)   Collection Time: 12/22/16  9:26 AM  Result Value Ref Range Status   Specimen Description BLOOD RIGHT HAND  Final   Special Requests BOTTLES DRAWN AEROBIC ONLY 6CC  Final   Culture PENDING  Incomplete   Report Status PENDING  Incomplete     Scheduled Meds: . insulin aspart  0-9 Units Subcutaneous Q4H  . magnesium sulfate 1 - 4 g bolus IVPB  2 g Intravenous Once  . meropenem (MERREM) IV  1 g Intravenous Q12H   Continuous Infusions: . 0.9 % NaCl with KCl 20 mEq / L 100 mL/hr at 12/21/16 1340    Procedures/Studies: Dg Chest Portable 1 View  Result Date: 12/19/2016 CLINICAL DATA:  Sepsis and pneumonia. EXAM: PORTABLE CHEST 1 VIEW COMPARISON:  Single-view of the chest 12/18/2016 and 11/07/2016. FINDINGS: Bilateral mid and lower lung zone airspace disease does not appear changed compared to the most recent examination. Heart size is normal. No pneumothorax. Aortic atherosclerosis noted. IMPRESSION: No marked change in bilateral airspace disease most consistent with pneumonia. Atherosclerosis. Electronically Signed   By: Drusilla Kanner M.D.   On: 12/19/2016 07:23   Dg Chest Portable 1 View  Result Date: 12/19/2016 CLINICAL DATA:  Aortic atherosclerosis. EXAM: PORTABLE CHEST 1  VIEW COMPARISON:  11/07/2016 FINDINGS: Lungs are hypoinflated with heterogeneous airspace opacification over the right mid to lower lung and left base likely pneumonia. No evidence of effusion. Cardiomediastinal silhouette is within normal. There is minimal calcified plaque over the aortic arch. There are mild degenerative changes of the spine. IMPRESSION: Multifocal airspace process over the mid to lower lungs right worse than left likely pneumonia. Electronically Signed   By: Elberta Fortis M.D.   On: 12/19/2016 00:06    Jo-Ann Johanning, DO  [facility] Pager [phone]  If 7PM-7AM, please contact night-coverage [email or web address] Password [email or web address] 12/22/2016, 1:14 PM   LOS: 3 days

## 2016-12-22 NOTE — Consult Note (Signed)
WOC consult requested for heel and sacrum wound.  This was already performed on 1/29; refer to previous consult note for assessment and plan of care. Topical treatment orders have been provided for the bedside nurses.  Please re-consult if further assistance is needed.  Thank-you,  Cammie Mcgeeawn Layken Doenges MSN, RN, CWOCN, [place]AP, CNS [phone]

## 2016-12-23 DIAGNOSIS — R319 Hematuria, unspecified: Secondary | ICD-10-CM

## 2016-12-23 DIAGNOSIS — N390 Urinary tract infection, site not specified: Secondary | ICD-10-CM

## 2016-12-23 LAB — CBC
HCT: 25 % — ABNORMAL LOW (ref 36.0–46.0)
Hemoglobin: 7.6 g/dL — ABNORMAL LOW (ref 12.0–15.0)
MCH: 25.3 pg — ABNORMAL LOW (ref 26.0–34.0)
MCHC: 30.4 g/dL (ref 30.0–36.0)
MCV: 83.3 fL (ref 78.0–100.0)
PLATELETS: 57 10*3/uL — AB (ref 150–400)
RBC: 3 MIL/uL — ABNORMAL LOW (ref 3.87–5.11)
RDW: 17.2 % — AB (ref 11.5–15.5)
WBC: 13.5 10*3/uL — AB (ref 4.0–10.5)

## 2016-12-23 LAB — BASIC METABOLIC PANEL
Anion gap: 5 (ref 5–15)
BUN: 14 mg/dL (ref 6–20)
CALCIUM: 7.2 mg/dL — AB (ref 8.9–10.3)
CHLORIDE: 124 mmol/L — AB (ref 101–111)
CO2: 27 mmol/L (ref 22–32)
CREATININE: 0.44 mg/dL (ref 0.44–1.00)
Glucose, Bld: 284 mg/dL — ABNORMAL HIGH (ref 65–99)
Potassium: 2.8 mmol/L — ABNORMAL LOW (ref 3.5–5.1)
SODIUM: 156 mmol/L — AB (ref 135–145)

## 2016-12-23 LAB — PROCALCITONIN: PROCALCITONIN: 2.98 ng/mL

## 2016-12-23 LAB — GLUCOSE, CAPILLARY
GLUCOSE-CAPILLARY: 265 mg/dL — AB (ref 65–99)
Glucose-Capillary: 265 mg/dL — ABNORMAL HIGH (ref 65–99)

## 2016-12-23 LAB — FIBRINOGEN: Fibrinogen: 439 mg/dL (ref 210–475)

## 2016-12-23 LAB — MAGNESIUM: MAGNESIUM: 1.8 mg/dL (ref 1.7–2.4)

## 2016-12-23 LAB — APTT: aPTT: 33 seconds (ref 24–36)

## 2016-12-23 LAB — PROTIME-INR
INR: 1.38
PROTHROMBIN TIME: 17.1 s — AB (ref 11.4–15.2)

## 2016-12-23 MED ORDER — POTASSIUM CHLORIDE 20 MEQ/15ML (10%) PO SOLN
40.0000 meq | Freq: Once | ORAL | Status: AC
  Administered 2016-12-23: 40 meq via ORAL
  Filled 2016-12-23: qty 30

## 2016-12-23 MED ORDER — POTASSIUM CHLORIDE IN NACL 40-0.9 MEQ/L-% IV SOLN
INTRAVENOUS | Status: DC
  Administered 2016-12-23 – 2016-12-24 (×2): 100 mL/h via INTRAVENOUS

## 2016-12-23 MED ORDER — POTASSIUM CHLORIDE 2 MEQ/ML IV SOLN
INTRAVENOUS | Status: DC
Start: 2016-12-23 — End: 2016-12-23

## 2016-12-23 MED ORDER — POTASSIUM CHLORIDE 10 MEQ/100ML IV SOLN
10.0000 meq | INTRAVENOUS | Status: AC
  Administered 2016-12-23 (×3): 10 meq via INTRAVENOUS
  Filled 2016-12-23 (×2): qty 100

## 2016-12-23 MED ORDER — POTASSIUM CHLORIDE 2 MEQ/ML IV SOLN: 30.0000 meq | Freq: Once | INTRAVENOUS | Status: DC

## 2016-12-23 NOTE — Progress Notes (Signed)
PROGRESS NOTE  Ashlee Barron ZOX:[id number] DOB: March 09, 1928 DOA: 12/18/2016 PCP: No PCP Per Patient  Brief History:  83 year old woman admitted from her skilled nursing facility on 1/28 due to shortness of breath, obtundation, and fever. She has a history of advanced dementia and is basically bedbound prior to admission. She is a DO NOT RESUSCITATE. She is found to have gram positive and ESBL EColi bacteremiaand ESBL Escherichia coli UTI. The patient also has aspiration pneumonia. The patient's antibiotics were changed to meropenem on 12/21/2016.We have had multiple palliative care interventions with the family.   Assessment/Plan: Sepsis -Secondary to aspiration pneumonia,UTI and bacteremia -Discontinue vancomycin and cefepime -Continue meropenem -Continue IV fluids -Lactic acid peaked at 7.0 -Procalcitonin 14.78-->2.98  UTI -ESBL Escherichia coli -Continue meropenem  Bacteremia--Escherichia coli -Continue meropenem as discussed -Viridans streptococcus likely a contaminant -Surveillance blood cultures--negative to date -Will need 14 days of IV antibiotics from the last negative culture -plan PICC line on 12/26/16 if repeat blood cultures remain negative  Aspiration pneumonia -Long discussion with the patient's family -They appear to be somewhat accepting of the risks of aspiration, but at the same time ask about a "feeding tube" -Speech therapy evaluation appreciated--> dysphagia 1 diet with thin liquids  Acute respiratory failure with hypoxia -Secondary to aspiration pneumonia -Continue supplemental oxygen--stable on 2 L  Acute metabolic encephalopathy -Secondary to infectious process -Gradually improving  Hypernatremia -continue D5W--increase rate to 100 cc/hr-->improving -am BMP  Hypokalemia/hypomagnesemia -repleted  Thrombocytopenia -likely due to sepsis -INR 1.38 -PTT--33 -check fibrinogen--439  Dementia without behavior  disturbance -haldol prn agitation  Stage III sacral decubitus ulcer -wound care nurse consult appreciated -present at time of admission  GOC -maintain DNR -family has unrealistic expectations--they 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 She she intermittently answers questions appropriately. Denies any fevers, chills, sob. When asked if she is having any pain, she states that "I hurt all over". No reports of vomiting, diarrhea, respiratory distress.  Objective: Vitals:   12/22/16 0414 12/22/16 2052 12/22/16 2142 12/23/16 0652  BP: (!) 129/49  (!) 142/60 (!) 150/69  Pulse: 81  90 87  Resp: 16  16 18   Temp: 99.3 F (37.4 C)  98.3 F (36.8 C) 98.1 F (36.7 C)  TempSrc: Axillary  Oral Oral  SpO2: 99% 99% 99% 100%  Weight:      Height:        Intake/Output Summary (Last 24 hours) at 12/23/16 1147 Last data filed at 12/23/16 0656  Gross per 24 hour  Intake          3811.67 ml  Output             1300 ml  Net          2511.67 ml   Weight change:  Exam:   General:  Pt is alert, follows commands appropriately, not in acute distress  HEENT: No icterus, No thrush, No  neck mass, [place]/AT  Cardiovascular: RRR, S1/S2, no rubs, no gallops  Respiratory: Bibasilar rales, regular (no wheeze.  Abdomen: Soft/+BS, non tender, non distended, no  guarding  Extremities: No edema, No lymphangitis, No petechiae, No rashes, no synovitis  -Sacral wound with small amount of yellow slough at the base with greater than 50% granular tissue. Some mild necrotic skin on the periwound.   Data Reviewed: I have personally reviewed following labs and imaging studies Basic Metabolic Panel:  Recent Labs Lab 12/18/16 2345 12/19/16 0435 12/20/16 0449 12/20/16 1214 12/21/16 0511 12/22/16 0551 12/23/16 0623  NA 161* 160* 163*  --  163*  --  156*  K 3.2* 3.0* 2.6*  --  3.8  --  2.8*  CL 121* 122* 129*  --  127*  --  124*  CO2 30 26 26   --  28  --  27  GLUCOSE 472* 486* 223*  --  148*  --  284*  BUN 38* 36* 36*  --  28*  --  14  CREATININE 0.81 0.81 0.63  --  0.57  --  0.44  CALCIUM 8.5* 8.1* 7.6*  --  8.1*  --  7.2*  MG  --   --   --  1.9  --  1.6* 1.8   Liver Function Tests:  Recent Labs Lab 12/18/16 2345  AST 16  ALT 10*  ALKPHOS 113  BILITOT 0.6  PROT 6.5  ALBUMIN 2.0*   No results for input(s): LIPASE, AMYLASE in the last 168 hours. No results for input(s): AMMONIA in the last 168 hours. Coagulation Profile:  Recent Labs Lab 12/23/16 0623  INR 1.38   CBC:  Recent Labs Lab 12/18/16 2345 12/21/16 0511 12/22/16 0551 12/23/16 0623  WBC 12.8* 19.9* 15.3* 13.5*  NEUTROABS 11.5*  --   --   --   HGB 10.9* 9.4* 8.0* 7.6*  HCT 36.5 31.9* 25.9* 25.0*  MCV 85.1 86.2 82.2 83.3  PLT 166 92* 63* 57*   Cardiac Enzymes: No results for input(s): CKTOTAL, CKMB, CKMBINDEX, TROPONINI in the last 168 hours. BNP: Invalid input(s): POCBNP CBG:  Recent Labs Lab 12/22/16 1146 12/22/16 1645 12/22/16 2140 12/23/16 0049 12/23/16 0428  GLUCAP 182* 183* 269* 265* 265*   HbA1C: No results for input(s): HGBA1C in the last 72 hours. Urine analysis:    Component Value Date/Time   COLORURINE YELLOW 12/18/2016 2345   APPEARANCEUR TURBID (A) 12/18/2016 2345   APPEARANCEUR Clear 12/18/2013 2115   LABSPEC 1.025 12/18/2016 2345    LABSPEC 1.008 12/18/2013 2115   PHURINE 5.0 12/18/2016 2345   GLUCOSEU >=500 (A) 12/18/2016 2345   GLUCOSEU Negative 12/18/2013 2115   HGBUR MODERATE (A) 12/18/2016 2345   BILIRUBINUR NEGATIVE 12/18/2016 2345   BILIRUBINUR Negative 12/18/2013 2115   KETONESUR NEGATIVE 12/18/2016 2345   PROTEINUR 100 (A) 12/18/2016 2345   UROBILINOGEN 0.2 05/11/2013 1501   NITRITE NEGATIVE 12/18/2016 2345   LEUKOCYTESUR MODERATE (A) 12/18/2016 2345   LEUKOCYTESUR Negative 12/18/2013 2115   Sepsis Labs: @LABRCNTIP (procalcitonin:4,lacticidven:4) ) Recent Results (from the past 240 hour(s))  Urine culture     Status: Abnormal   Collection Time: 12/18/16 11:45 PM  Result Value Ref Range Status   Specimen Description URINE, RANDOM  Final   Special Requests NONE  Final   Culture (A)  Final    >=100,000 COLONIES/mL ESCHERICHIA COLI Confirmed Extended Spectrum Beta-Lactamase Producer (ESBL) Performed at [facility], [address] [place], [place] [address]    Report Status 12/21/2016  FINAL  Final   Organism ID, Bacteria ESCHERICHIA COLI (A)  Final      Susceptibility   Escherichia coli - MIC*    AMPICILLIN >=32 RESISTANT Resistant     CEFAZOLIN >=64 RESISTANT Resistant     CEFTRIAXONE >=64 RESISTANT Resistant     CIPROFLOXACIN >=4 RESISTANT Resistant     GENTAMICIN <=1 SENSITIVE Sensitive     IMIPENEM <=0.25 SENSITIVE Sensitive     NITROFURANTOIN 128 RESISTANT Resistant     TRIMETH/SULFA >=320 RESISTANT Resistant     AMPICILLIN/SULBACTAM 16 INTERMEDIATE Intermediate     PIP/TAZO <=4 SENSITIVE Sensitive     Extended ESBL POSITIVE Resistant     * >=100,000 COLONIES/mL ESCHERICHIA COLI  Blood culture (routine x 2)     Status: Abnormal   Collection Time: 12/18/16 11:58 PM  Result Value Ref Range Status   Specimen Description BLOOD RIGHT ARM  Final   Special Requests BOTTLES DRAWN AEROBIC AND ANAEROBIC 5CC EACH  Final   Culture  Setup Time   Final    GRAM NEGATIVE RODS RECOVERED FROM  AEROBIC BOTTLE Gram Stain Report Called to,Read Back By and Verified With: VALDESE DILDY LPN AT 1610 BY HFLYNT 12/19/16 GRAM POSITIVE COCCI IN CHAINS RECOVERED FROM ANAEROBIC BOTTLE Gram Stain Report Called to,Read Back By and Verified With: ALSTON,C ON 12/19/16 AT 1930 BY LOY,C CRITICAL RESULT CALLED TO, READ BACK BY AND VERIFIED WITH: SHEENA HAMILTON,RN @0053  12/20/16 MKELLY,MLT    Culture (A)  Final    ESCHERICHIA COLI Confirmed Extended Spectrum Beta-Lactamase Producer (ESBL) VIRIDANS STREPTOCOCCUS THE SIGNIFICANCE OF ISOLATING THIS ORGANISM FROM A SINGLE SET OF BLOOD CULTURES WHEN MULTIPLE SETS ARE DRAWN IS UNCERTAIN. PLEASE NOTIFY THE MICROBIOLOGY DEPARTMENT WITHIN ONE WEEK IF SPECIATION AND SENSITIVITIES ARE REQUIRED. Performed at [facility], [address] [place], [place] [address]    Report Status 12/22/2016 FINAL  Final   Organism ID, Bacteria ESCHERICHIA COLI  Final      Susceptibility   Escherichia coli - MIC*    AMPICILLIN >=32 RESISTANT Resistant     CEFAZOLIN >=64 RESISTANT Resistant     CEFEPIME 2 RESISTANT Resistant     CEFTAZIDIME 16 RESISTANT Resistant     CEFTRIAXONE >=64 RESISTANT Resistant     CIPROFLOXACIN >=4 RESISTANT Resistant     GENTAMICIN <=1 SENSITIVE Sensitive     IMIPENEM <=0.25 SENSITIVE Sensitive     TRIMETH/SULFA >=320 RESISTANT Resistant     AMPICILLIN/SULBACTAM 16 INTERMEDIATE Intermediate     PIP/TAZO <=4 SENSITIVE Sensitive     Extended ESBL POSITIVE Resistant     * ESCHERICHIA COLI  Blood Culture ID Panel (Reflexed)     Status: Abnormal   Collection Time: 12/18/16 11:58 PM  Result Value Ref Range Status   Enterococcus species NOT DETECTED NOT DETECTED Corrected   Listeria monocytogenes NOT DETECTED NOT DETECTED Corrected   Staphylococcus species NOT DETECTED NOT DETECTED Corrected   Staphylococcus aureus NOT DETECTED NOT DETECTED Corrected   Streptococcus species NOT DETECTED NOT DETECTED Corrected    Comment: CORRECTED ON 01/30 AT  0054: PREVIOUSLY REPORTED AS DETECTED CRITICAL RESULT CALLED TO, READ BACK BY AND VERIFIED WITH: SHEENA HAMILTON,RN @0053  12/20/16 MKELLY,MLT   Streptococcus agalactiae NOT DETECTED NOT DETECTED Corrected   Streptococcus pneumoniae NOT DETECTED NOT DETECTED Corrected   Streptococcus pyogenes NOT DETECTED NOT DETECTED Corrected   Acinetobacter baumannii NOT DETECTED NOT DETECTED Corrected   Enterobacteriaceae species DETECTED (A) NOT DETECTED Corrected    Comment: CRITICAL RESULT CALLED TO, READ BACK  BY AND VERIFIED WITH: SHEENA HAMILTON,RN @0053  12/20/16 MKELLY,MLT CORRECTED ON 01/30 AT 0054: PREVIOUSLY REPORTED AS NOT DETECTED    Enterobacter cloacae complex NOT DETECTED NOT DETECTED Corrected   Escherichia coli DETECTED (A) NOT DETECTED Corrected    Comment: CRITICAL RESULT CALLED TO, READ BACK BY AND VERIFIED WITH: SHEENA HAMILTON,RN @0053  12/20/16 MKELLY,MLT CORRECTED ON 01/30 AT 0054: PREVIOUSLY REPORTED AS NOT DETECTED    Klebsiella oxytoca NOT DETECTED NOT DETECTED Corrected   Klebsiella pneumoniae NOT DETECTED NOT DETECTED Corrected   Proteus species NOT DETECTED NOT DETECTED Corrected   Serratia marcescens NOT DETECTED NOT DETECTED Corrected   Carbapenem resistance NOT DETECTED NOT DETECTED Corrected   Haemophilus influenzae NOT DETECTED NOT DETECTED Corrected   Neisseria meningitidis NOT DETECTED NOT DETECTED Corrected   Pseudomonas aeruginosa NOT DETECTED NOT DETECTED Corrected   Candida albicans NOT DETECTED NOT DETECTED Corrected   Candida glabrata NOT DETECTED NOT DETECTED Corrected   Candida krusei NOT DETECTED NOT DETECTED Corrected   Candida parapsilosis NOT DETECTED NOT DETECTED Corrected   Candida tropicalis NOT DETECTED NOT DETECTED Corrected    Comment: Performed at [facility], [address] [place], [place] [address]  Blood culture (routine x 2)     Status: None (Preliminary result)   Collection Time: 12/19/16 12:10 AM  Result Value Ref Range Status    Specimen Description BLOOD RIGHT HAND  Final   Special Requests BOTTLES DRAWN AEROBIC ONLY 5CC ONLY  Final   Culture NO GROWTH 4 DAYS  Final   Report Status PENDING  Incomplete  MRSA PCR Screening     Status: None   Collection Time: 12/19/16 11:39 AM  Result Value Ref Range Status   MRSA by PCR NEGATIVE NEGATIVE Final    Comment:        The GeneXpert MRSA Assay (FDA approved for NASAL specimens only), is one component of a comprehensive MRSA colonization surveillance program. It is not intended to diagnose MRSA infection nor to guide or monitor treatment for MRSA infections.   Blood Culture ID Panel (Reflexed)     Status: Abnormal   Collection Time: 12/19/16 11:00 PM  Result Value Ref Range Status   Enterococcus species NOT DETECTED NOT DETECTED Final   Vancomycin resistance NOT DETECTED NOT DETECTED Final   Listeria monocytogenes NOT DETECTED NOT DETECTED Final   Staphylococcus species NOT DETECTED NOT DETECTED Final   Staphylococcus aureus NOT DETECTED NOT DETECTED Final   Methicillin resistance NOT DETECTED NOT DETECTED Final   Streptococcus species DETECTED (A) NOT DETECTED Final    Comment: CRITICAL RESULT CALLED TO, READ BACK BY AND VERIFIED WITH: SHEENA HAMILTON,RN @0053  12/20/16 MKELLY,MLT    Streptococcus agalactiae NOT DETECTED NOT DETECTED Final   Streptococcus pneumoniae NOT DETECTED NOT DETECTED Final   Streptococcus pyogenes NOT DETECTED NOT DETECTED Final   Acinetobacter baumannii NOT DETECTED NOT DETECTED Final   Enterobacteriaceae species NOT DETECTED NOT DETECTED Final   Enterobacter cloacae complex NOT DETECTED NOT DETECTED Final   Escherichia coli NOT DETECTED NOT DETECTED Final   Klebsiella oxytoca NOT DETECTED NOT DETECTED Final   Klebsiella pneumoniae NOT DETECTED NOT DETECTED Final   Proteus species NOT DETECTED NOT DETECTED Final   Serratia marcescens NOT DETECTED NOT DETECTED Final   Carbapenem resistance NOT DETECTED NOT DETECTED Final    Haemophilus influenzae NOT DETECTED NOT DETECTED Final   Neisseria meningitidis NOT DETECTED NOT DETECTED Final   Pseudomonas aeruginosa NOT DETECTED NOT DETECTED Final   Candida albicans NOT DETECTED NOT  DETECTED Final   Candida glabrata NOT DETECTED NOT DETECTED Final   Candida krusei NOT DETECTED NOT DETECTED Final   Candida parapsilosis NOT DETECTED NOT DETECTED Final   Candida tropicalis NOT DETECTED NOT DETECTED Final    Comment: Performed at [facility], [address], [place], [place] [address]  Culture, blood (Routine X 2) w Reflex to ID Panel     Status: None (Preliminary result)   Collection Time: 12/22/16  9:14 AM  Result Value Ref Range Status   Specimen Description BLOOD RIGHT ARM  Final   Special Requests BOTTLES DRAWN AEROBIC AND ANAEROBIC 10CC  Final   Culture NO GROWTH < 24 HOURS  Final   Report Status PENDING  Incomplete  Culture, blood (Routine X 2) w Reflex to ID Panel     Status: None (Preliminary result)   Collection Time: 12/22/16  9:26 AM  Result Value Ref Range Status   Specimen Description BLOOD RIGHT HAND  Final   Special Requests BOTTLES DRAWN AEROBIC ONLY 6CC  Final   Culture NO GROWTH < 24 HOURS  Final   Report Status PENDING  Incomplete     Scheduled Meds: . donepezil  10 mg Oral QHS  . meropenem (MERREM) IV  1 g Intravenous Q12H  . sodium hypochlorite   Topical q morning - 10a   Continuous Infusions: . 0.9 % NaCl with KCl 20 mEq / L 100 mL/hr at 12/22/16 2018    Procedures/Studies: Dg Chest Portable 1 View  Result Date: 12/19/2016 CLINICAL DATA:  Sepsis and pneumonia. EXAM: PORTABLE CHEST 1 VIEW COMPARISON:  Single-view of the chest 12/18/2016 and 11/07/2016. FINDINGS: Bilateral mid and lower lung zone airspace disease does not appear changed compared to the most recent examination. Heart size is normal. No pneumothorax. Aortic atherosclerosis noted. IMPRESSION: No marked change in bilateral airspace disease most consistent with pneumonia.  Atherosclerosis. Electronically Signed   By: Drusilla Kanner M.D.   On: 12/19/2016 07:23   Dg Chest Portable 1 View  Result Date: 12/19/2016 CLINICAL DATA:  Aortic atherosclerosis. EXAM: PORTABLE CHEST 1 VIEW COMPARISON:  11/07/2016 FINDINGS: Lungs are hypoinflated with heterogeneous airspace opacification over the right mid to lower lung and left base likely pneumonia. No evidence of effusion. Cardiomediastinal silhouette is within normal. There is minimal calcified plaque over the aortic arch. There are mild degenerative changes of the spine. IMPRESSION: Multifocal airspace process over the mid to lower lungs right worse than left likely pneumonia. Electronically Signed   By: Elberta Fortis M.D.   On: 12/19/2016 00:06    Andersyn Fragoso, DO  [facility] Pager [phone]  If 7PM-7AM, please contact night-coverage [email or web address] Password [email or web address] 12/23/2016, 11:47 AM   LOS: 4 days

## 2016-12-23 NOTE — Clinical Social Work Placement (Signed)
Discussed SNF with pt's daughter, Ashlee Barron as pt will require IV antibiotics at d/c. Request[place] or [facility].   CLINICAL SOCIAL WORK PLACEMENT  NOTE  Date:  12/23/2016  Patient Details  Name: Ashlee Barron MRN: [id number] Date of Birth: [birth year]  Clinical Social Work is seeking post-discharge placement for this patient at the Skilled  Nursing Facility level of care (*CSW will initial, date and re-position this form in  chart as items are completed):  Yes   Patient/family provided with [place] Clinical Social Work Department's list of facilities offering this level of care within the geographic area requested by the patient (or if unable, by the patient's family).  Yes   Patient/family informed of their freedom to choose among providers that offer the needed level of care, that participate in Medicare, Medicaid or managed care program needed by the patient, have an available bed and are willing to accept the patient.  Yes   Patient/family informed of [facility]'s ownership interest in [facility] and [facility], as well as of the fact that they are under no obligation to receive care at these facilities.  PASRR submitted to EDS on 12/23/16     PASRR number received on 12/23/16     Existing PASRR number confirmed on       FL2 transmitted to all facilities in geographic area requested by pt/family on 12/23/16     FL2 transmitted to all facilities within larger geographic area on       Patient informed that his/her managed care company has contracts with or will negotiate with certain facilities, including the following:            Patient/family informed of bed offers received.  Patient chooses bed at       Physician recommends and patient chooses bed at      Patient to be transferred to   on  .  Patient to be transferred to facility by       Patient family notified on   of transfer.  Name of family member notified:        PHYSICIAN        Additional Comment:    _______________________________________________ [name], Daphney Hopke [place], LCSW 12/23/2016, 9:31 AM [phone]

## 2016-12-23 NOTE — Progress Notes (Signed)
Inpatient Diabetes Program Recommendations  AACE/ADA: New Consensus Statement on Inpatient Glycemic Control (2015)  Target Ranges:  Prepandial:   less than 140 mg/dL      Peak postprandial:   less than 180 mg/dL (1-2 hours)      Critically ill patients:  140 - 180 mg/dL   Results for Orlean BradfordHINTON, Myca F (MRN [id number]) as of 12/23/2016 12:08  Ref. Range 12/22/2016 21:40 12/23/2016 00:49 12/23/2016 04:28  Glucose-Capillary Latest Ref Range: 65 - 99 mg/dL 696269 (H) 295265 (H) 284265 (H)    Home DM Meds: None  Current Insulin Orders: None     MD- If within goals of care for this patient, please consider placing orders for Novolog Sensitive Correction Scale/ SSI (0-9 units) TID AC + HS     --Will follow patient during hospitalization--  Ambrose FinlandJeannine Johnston Kelsye Loomer RN, MSN, CDE Diabetes Coordinator Inpatient Glycemic Control Team Team Pager: [phone] (8a-5p)

## 2016-12-23 NOTE — Care Management Important Message (Signed)
Important Message  Patient Details  Name: Orlean BradfordMildred F Weinert MRN: [id number] Date of Birth: [birth year]   Medicare Important Message Given:  Yes    Malcolm MetroChildress, Mikala Podoll [place], RN 12/23/2016, 12:01 PM

## 2016-12-23 NOTE — NC FL2 (Signed)
[place] MEDICAID FL2 LEVEL OF CARE SCREENING TOOL     IDENTIFICATION  Patient Name: Ashlee Barron Birthdate: 11/02/1928 Sex: female Admission Date (Current Location): 12/18/2016  [facility] and [place] Number:  [employer] and Address:  [facility],  [address], [place] [address]      Provider Number: [id number]  Attending Physician Name and Address:  Catarina Hartshorn, MD  Relative Name and Phone Number:       Current Level of Care: Hospital Recommended Level of Care: Skilled Nursing Facility Prior Approval Number:    Date Approved/Denied:   PASRR Number: [id number]  Discharge Plan: SNF    Current Diagnoses: Patient Active Problem List   Diagnosis Date Noted  . Bacteremia due to Escherichia coli 12/22/2016  . Thrombocytopenia (HCC) 12/22/2016  . Acute encephalopathy 12/22/2016  . Gram-negative bacteremia 12/20/2016  . Gram-positive bacteremia 12/20/2016  . Sepsis (HCC) 12/19/2016  . Acute respiratory failure (HCC) 12/19/2016  . Hospital acquired PNA 12/19/2016  . Urinary tract infection with hematuria 12/19/2016  . Hypernatremia 12/19/2016  . Pressure injury of skin 12/19/2016  . HCAP (healthcare-associated pneumonia)   . Palliative care encounter   . Goals of care, counseling/discussion   . Hypoalbuminemia 11/01/2016  . Edema of left foot 11/01/2016  . Acute cystitis without hematuria   . Sepsis secondary to UTI (HCC) 10/31/2016  . Type 2 diabetes mellitus (HCC) 10/31/2016  . Dementia 10/31/2016  . Essential hypertension 10/31/2016  . Aspiration pneumonia (HCC) 10/31/2016  . Abnormal EKG 10/31/2016  . Altered mental status 10/19/2016  . Constipation 09/09/2016  . Dehydration 09/09/2016  . Diabetes (HCC) 09/09/2016    Orientation RESPIRATION BLADDER Height & Weight     Self  O2 (6 L) Indwelling catheter Weight: 119 lb 14.9 oz (54.4 kg) Height:  5\' 4"  (162.6 cm)  BEHAVIORAL SYMPTOMS/MOOD NEUROLOGICAL BOWEL NUTRITION STATUS   Other (Comment) (none)  (n/a) Incontinent Diet (Dysphagia 1 with thin liquids. No straws.)  AMBULATORY STATUS COMMUNICATION OF NEEDS Skin   Total Care Verbally Other (Comment), Skin abrasions (Moisture associated skin damage to groin. Unstageable to left heel with daily dressing changes. Stage IV to sacrum with abdominal pads and gauze. Wounds to bilateral knees with foam dressing. )                       Personal Care Assistance Level of Assistance  Total care       Total Care Assistance: Maximum assistance   Functional Limitations Info  Sight, Hearing, Speech Sight Info: Adequate Hearing Info: Adequate Speech Info: Adequate    SPECIAL CARE FACTORS FREQUENCY  Diabetic urine testing                    Contractures      Additional Factors Info  Psychotropic Code Status Info: DNR Allergies Info: No known allergies Psychotropic Info: Haldol   Isolation Precautions Info: 12/18/2016 E Coli + ESBL in urine culture      Current Medications (12/23/2016):  This is the current hospital active medication list Current Facility-Administered Medications  Medication Dose Route Frequency Provider Last Rate Last Dose  . 0.9 % NaCl with KCl 20 mEq/ L  infusion   Intravenous Continuous Catarina Hartshorn, MD 100 mL/hr at 12/22/16 2018    . donepezil (ARICEPT) tablet 10 mg  10 mg Oral QHS Catarina Hartshorn, MD   10 mg at 12/22/16 2256  . haloperidol lactate (HALDOL) injection 2 mg  2 mg  Intravenous Q6H PRN Catarina Hartshornavid Tat, MD      . meropenem (MERREM) 1 g in sodium chloride 0.9 % 100 mL IVPB  1 g Intravenous Q12H Catarina Hartshornavid Tat, MD   1 g at 12/22/16 2256  . sodium hypochlorite (DAKIN'S 1/4 STRENGTH) topical solution   Topical q morning - 10a Catarina Hartshornavid Tat, MD         Discharge Medications: Please see discharge summary for a list of discharge medications.  Relevant Imaging Results:  Relevant Lab Results:   Additional Information SSN: [id number]. PICC line for IV antibiotics 14 days past negative culture.    Derenda FennelStultz, Jaelah Hauth [place], [place] [phone]

## 2016-12-23 NOTE — Care Management Note (Signed)
Case Management Note  Patient Details  Name: Ashlee BradfordMildred F Puchalski MRN: [id number] Date of Birth: 1928/02/11  Subjective/Objective:                  [facility]. She was active with [facility]. Palliaitve NP seeing pt here in hospital. Plan for pt to DC to SNF. CSW is aware and working with pt/family on arrangements. Donnella Shamharlse Alston, of [facility], is aware of admission and DC plan.   Action/Plan: No CM needs anticipated.   Expected Discharge Date:     12/27/2016             Expected Discharge Plan:  Skilled Nursing Facility  In-House Referral:  Clinical Social Work, Hospice / Palliative Care  Discharge planning Services  CM Consult  Post Acute Care Choice:  NA Choice offered to:  NA  Status of Service:  Completed, signed off  Malcolm MetroChildress, Jeily Guthridge Demske, RN 12/23/2016, 11:59 AM

## 2016-12-23 NOTE — Clinical Social Work Placement (Signed)
   CLINICAL SOCIAL WORK PLACEMENT  NOTE  Date:  12/23/2016  Patient Details  Name: Orlean BradfordMildred F Lyvers MRN: [id number] Date of Birth: November 19, 1928  Clinical Social Work is seeking post-discharge placement for this patient at the Skilled  Nursing Facility level of care (*CSW will initial, date and re-position this form in  chart as items are completed):  Yes   Patient/family provided with [place] Clinical Social Work Department's list of facilities offering this level of care within the geographic area requested by the patient (or if unable, by the patient's family).  Yes   Patient/family informed of their freedom to choose among providers that offer the needed level of care, that participate in Medicare, Medicaid or managed care program needed by the patient, have an available bed and are willing to accept the patient.  Yes   Patient/family informed of [facility]'s ownership interest in [facility] and [facility], as well as of the fact that they are under no obligation to receive care at these facilities.  PASRR submitted to EDS on 12/23/16     PASRR number received on 12/23/16     Existing PASRR number confirmed on       FL2 transmitted to all facilities in geographic area requested by pt/family on 12/23/16     FL2 transmitted to all facilities within larger geographic area on       Patient informed that his/her managed care company has contracts with or will negotiate with certain facilities, including the following:        Yes   Patient/family informed of bed offers received.  [facility]     Physician recommends and patient chooses bed at      [facility]  .  Patient to be transferred to facility by       Patient family notified on   of transfer.  Name of family member notified:        PHYSICIAN       Additional Comment:     _______________________________________________ [name], Johntae Broxterman Shanaberger, LCSW 12/23/2016, 12:23 PM [phone]

## 2016-12-24 LAB — CBC
HCT: 23.9 % — ABNORMAL LOW (ref 36.0–46.0)
Hemoglobin: 7.2 g/dL — ABNORMAL LOW (ref 12.0–15.0)
MCH: 25.2 pg — AB (ref 26.0–34.0)
MCHC: 30.1 g/dL (ref 30.0–36.0)
MCV: 83.6 fL (ref 78.0–100.0)
PLATELETS: 70 10*3/uL — AB (ref 150–400)
RBC: 2.86 MIL/uL — AB (ref 3.87–5.11)
RDW: 17.1 % — ABNORMAL HIGH (ref 11.5–15.5)
WBC: 11.6 10*3/uL — ABNORMAL HIGH (ref 4.0–10.5)

## 2016-12-24 LAB — GLUCOSE, CAPILLARY
Glucose-Capillary: 222 mg/dL — ABNORMAL HIGH (ref 65–99)
Glucose-Capillary: 214 mg/dL — ABNORMAL HIGH (ref 65–99)
Glucose-Capillary: 218 mg/dL — ABNORMAL HIGH (ref 65–99)

## 2016-12-24 LAB — BASIC METABOLIC PANEL
Anion gap: 5 (ref 5–15)
BUN: 11 mg/dL (ref 6–20)
CALCIUM: 7 mg/dL — AB (ref 8.9–10.3)
CO2: 26 mmol/L (ref 22–32)
CREATININE: 0.42 mg/dL — AB (ref 0.44–1.00)
Chloride: 124 mmol/L — ABNORMAL HIGH (ref 101–111)
GFR calc non Af Amer: >60 mL/min (ref >60)
Glucose, Bld: 298 mg/dL — ABNORMAL HIGH (ref 65–99)
Potassium: 3 mmol/L — ABNORMAL LOW (ref 3.5–5.1)
Sodium: 155 mmol/L — ABNORMAL HIGH (ref 135–145)

## 2016-12-24 LAB — CULTURE, BLOOD (ROUTINE X 2): Culture: NO GROWTH

## 2016-12-24 LAB — MAGNESIUM: MAGNESIUM: 1.5 mg/dL — AB (ref 1.7–2.4)

## 2016-12-24 MED ORDER — POTASSIUM CHLORIDE 2 MEQ/ML IV SOLN
INTRAVENOUS | Status: DC
  Administered 2016-12-24 – 2016-12-27 (×7): via INTRAVENOUS
  Filled 2016-12-24 (×9): qty 1000

## 2016-12-24 MED ORDER — POTASSIUM CHLORIDE 10 MEQ/100ML IV SOLN
10.0000 meq | INTRAVENOUS | Status: AC
  Administered 2016-12-24 (×3): 10 meq via INTRAVENOUS
  Filled 2016-12-24: qty 100

## 2016-12-24 MED ORDER — INSULIN ASPART 100 UNIT/ML ~~LOC~~ SOLN
0.0000 [IU] | Freq: Every day | SUBCUTANEOUS | Status: DC
  Administered 2016-12-24: 2 [IU] via SUBCUTANEOUS
  Administered 2016-12-25: 3 [IU] via SUBCUTANEOUS

## 2016-12-24 MED ORDER — POTASSIUM CHLORIDE 20 MEQ/15ML (10%) PO SOLN
40.0000 meq | Freq: Every day | ORAL | Status: DC
  Administered 2016-12-24 – 2016-12-28 (×3): 40 meq via ORAL
  Filled 2016-12-24 (×3): qty 30

## 2016-12-24 MED ORDER — INSULIN ASPART 100 UNIT/ML ~~LOC~~ SOLN
0.0000 [IU] | Freq: Three times a day (TID) | SUBCUTANEOUS | Status: DC
  Administered 2016-12-24: 3 [IU] via SUBCUTANEOUS
  Administered 2016-12-24: 2 [IU] via SUBCUTANEOUS
  Administered 2016-12-25: 3 [IU] via SUBCUTANEOUS
  Administered 2016-12-25: 5 [IU] via SUBCUTANEOUS
  Administered 2016-12-25 – 2016-12-26 (×3): 3 [IU] via SUBCUTANEOUS
  Administered 2016-12-26: 5 [IU] via SUBCUTANEOUS
  Administered 2016-12-27: 1 [IU] via SUBCUTANEOUS
  Administered 2016-12-27: 2 [IU] via SUBCUTANEOUS

## 2016-12-24 MED ORDER — MAGNESIUM SULFATE 2 GM/50ML IV SOLN
2.0000 g | Freq: Once | INTRAVENOUS | Status: AC
  Administered 2016-12-24: 2 g via INTRAVENOUS
  Filled 2016-12-24: qty 50

## 2016-12-24 NOTE — Progress Notes (Signed)
PROGRESS NOTE  Ashlee BradfordMildred F Barron ZOX:[id number] DOB: 1928/03/02 DOA: 12/18/2016 PCP: No PCP Per Patient  Brief History: 83 year old woman admitted from her skilled nursing facility on 1/28 due to shortness of breath, obtundation,and fever. She has a history of advanced dementia and is basically bedbound prior to admission. She is a DO NOT RESUSCITATE. She is found to have gram positive and ESBL EColi bacteremiaand ESBL Escherichia coli UTI. The patient also has aspiration pneumonia. The patient's antibiotics were changed to meropenem on 12/21/2016.We have had multiple palliative care interventions with the family.   Assessment/Plan: Sepsis -Secondary to aspiration pneumonia,UTI and bacteremia -Discontinue vancomycin and cefepime -Continue meropenem -Continue IV fluids--switch to D5 -Lactic acid peaked at 7.0 -Procalcitonin 14.78-->2.98  UTI -ESBL Escherichia coli -Continuemeropenem  Bacteremia--Escherichia coli -Continue meropenem as discussed -Viridans streptococcus likely a contaminant -Surveillance blood cultures--negative to date -Willneed 14 days of IV antibiotics from the last negative culture -plan PICC line on 12/26/16 if repeat blood cultures remain negative  Aspiration pneumonia -Long discussion with the patient's family -They appear to be somewhat accepting of the risks of aspiration, but at the same time ask about a "feeding tube" -Speech therapy evaluationappreciated-->dysphagia 1 diet with thin liquids  Acute respiratory failure with hypoxia -Secondary to aspiration pneumonia -Continue supplemental oxygen--stable on 2 L  Acute metabolic encephalopathy -Secondary to infectious process -Gradually improving  Hypernatremia -continue D5W--increase rate to 100 cc/hr-->improving -am BMP  Hypokalemia/hypomagnesemia -repleted  Thrombocytopenia -likely due to sepsis -INR 1.38 -PTT--33 -check fibrinogen--439  Dementia without  behavior disturbance -haldol prn agitation--not to be used for dressing changes  Stage III sacral decubitus ulcer -wound care nurse consult appreciated -present at time of admission  GOC -maintain DNR -family has unrealistic expectations--they continue to state that "the patient is a IT sales professionalfighter, and "she will make it through this" without concern of her repeated hospital admissions and ultimate quality of life -Appreciated palliative medicine -With clinical improvement, goal at this point is likely discharge back to SNF with palliative services    Disposition Plan: SNF with palliative services 12/26/16 if stable Family Communication: No family at bedside  Consultants: Palliative medicine  Code Status: DNR  DVT Prophylaxis: SCDs   Procedures: As Listed in Progress Note Above  Antibiotics: Merrem 12/21/16>>> Vanc/cefepime 1/28>>>1/31   Subjective:   Objective: Vitals:   12/23/16 1423 12/23/16 2159 12/24/16 0500 12/24/16 1429  BP: (!) 122/53 (!) 157/64 (!) 157/70 (!) 142/74  Pulse: 92 94 92 91  Resp: 18 18 18 18   Temp: 97.8 F (36.6 C) 98.2 F (36.8 C) 98.6 F (37 C) 98.6 F (37 C)  TempSrc: Axillary Oral Axillary Axillary  SpO2: 100% 100% 99% 98%  Weight:      Height:        Intake/Output Summary (Last 24 hours) at 12/24/16 1514 Last data filed at 12/24/16 1143  Gross per 24 hour  Intake          1856.66 ml  Output             1800 ml  Net            56.66 ml   Weight change:  Exam:   General:  Pt is alert, does not follow commands appropriately, not in acute distress  HEENT: No icterus, No thrush, No neck mass, [place]/AT  Cardiovascular: RRR, S1/S2, no rubs, no gallops  Respiratory: Diminished breath sounds bilateral bases. Bibasilar rales. No wheezing.  Abdomen: Soft/+BS, non tender, non  distended, no guarding  Extremities: 1 + LE edema, No lymphangitis, No petechiae, No rashes, no synovitis   Data Reviewed: I have personally  reviewed following labs and imaging studies Basic Metabolic Panel:  Recent Labs Lab 12/19/16 0435 12/20/16 0449 12/20/16 1214 12/21/16 0511 12/22/16 0551 12/23/16 0623 12/24/16 0616  NA 160* 163*  --  163*  --  156* 155*  K 3.0* 2.6*  --  3.8  --  2.8* 3.0*  CL 122* 129*  --  127*  --  124* 124*  CO2 26 26  --  28  --  27 26  GLUCOSE 486* 223*  --  148*  --  284* 298*  BUN 36* 36*  --  28*  --  14 11  CREATININE 0.81 0.63  --  0.57  --  0.44 0.42*  CALCIUM 8.1* 7.6*  --  8.1*  --  7.2* 7.0*  MG  --   --  1.9  --  1.6* 1.8 1.5*   Liver Function Tests:  Recent Labs Lab 12/18/16 2345  AST 16  ALT 10*  ALKPHOS 113  BILITOT 0.6  PROT 6.5  ALBUMIN 2.0*   No results for input(s): LIPASE, AMYLASE in the last 168 hours. No results for input(s): AMMONIA in the last 168 hours. Coagulation Profile:  Recent Labs Lab 12/23/16 0623  INR 1.38   CBC:  Recent Labs Lab 12/18/16 2345 12/21/16 0511 12/22/16 0551 12/23/16 0623 12/24/16 0616  WBC 12.8* 19.9* 15.3* 13.5* 11.6*  NEUTROABS 11.5*  --   --   --   --   HGB 10.9* 9.4* 8.0* 7.6* 7.2*  HCT 36.5 31.9* 25.9* 25.0* 23.9*  MCV 85.1 86.2 82.2 83.3 83.6  PLT 166 92* 63* 57* 70*   Cardiac Enzymes: No results for input(s): CKTOTAL, CKMB, CKMBINDEX, TROPONINI in the last 168 hours. BNP: Invalid input(s): POCBNP CBG:  Recent Labs Lab 12/22/16 1645 12/22/16 2140 12/23/16 0049 12/23/16 0428 12/24/16 1207  GLUCAP 183* 269* 265* 265* 222*   HbA1C: No results for input(s): HGBA1C in the last 72 hours. Urine analysis:    Component Value Date/Time   COLORURINE YELLOW 12/18/2016 2345   APPEARANCEUR TURBID (A) 12/18/2016 2345   APPEARANCEUR Clear 12/18/2013 2115   LABSPEC 1.025 12/18/2016 2345   LABSPEC 1.008 12/18/2013 2115   PHURINE 5.0 12/18/2016 2345   GLUCOSEU >=500 (A) 12/18/2016 2345   GLUCOSEU Negative 12/18/2013 2115   HGBUR MODERATE (A) 12/18/2016 2345   BILIRUBINUR NEGATIVE 12/18/2016 2345    BILIRUBINUR Negative 12/18/2013 2115   KETONESUR NEGATIVE 12/18/2016 2345   PROTEINUR 100 (A) 12/18/2016 2345   UROBILINOGEN 0.2 05/11/2013 1501   NITRITE NEGATIVE 12/18/2016 2345   LEUKOCYTESUR MODERATE (A) 12/18/2016 2345   LEUKOCYTESUR Negative 12/18/2013 2115   Sepsis Labs: @LABRCNTIP (procalcitonin:4,lacticidven:4) ) Recent Results (from the past 240 hour(s))  Urine culture     Status: Abnormal   Collection Time: 12/18/16 11:45 PM  Result Value Ref Range Status   Specimen Description URINE, RANDOM  Final   Special Requests NONE  Final   Culture (A)  Final    >=100,000 COLONIES/mL ESCHERICHIA COLI Confirmed Extended Spectrum Beta-Lactamase Producer (ESBL) Performed at [facility], [address] [place], [place] [address]    Report Status 12/21/2016 FINAL  Final   Organism ID, Bacteria ESCHERICHIA COLI (A)  Final      Susceptibility   Escherichia coli - MIC*    AMPICILLIN >=32 RESISTANT Resistant     CEFAZOLIN >=64 RESISTANT  Resistant     CEFTRIAXONE >=64 RESISTANT Resistant     CIPROFLOXACIN >=4 RESISTANT Resistant     GENTAMICIN <=1 SENSITIVE Sensitive     IMIPENEM <=0.25 SENSITIVE Sensitive     NITROFURANTOIN 128 RESISTANT Resistant     TRIMETH/SULFA >=320 RESISTANT Resistant     AMPICILLIN/SULBACTAM 16 INTERMEDIATE Intermediate     PIP/TAZO <=4 SENSITIVE Sensitive     Extended ESBL POSITIVE Resistant     * >=100,000 COLONIES/mL ESCHERICHIA COLI  Blood culture (routine x 2)     Status: Abnormal   Collection Time: 12/18/16 11:58 PM  Result Value Ref Range Status   Specimen Description BLOOD RIGHT ARM  Final   Special Requests BOTTLES DRAWN AEROBIC AND ANAEROBIC 5CC EACH  Final   Culture  Setup Time   Final    GRAM NEGATIVE RODS RECOVERED FROM AEROBIC BOTTLE Gram Stain Report Called to,Read Back By and Verified With: VALDESE DILDY LPN AT 8657 BY HFLYNT 12/19/16 GRAM POSITIVE COCCI IN CHAINS RECOVERED FROM ANAEROBIC BOTTLE Gram Stain Report Called to,Read Back  By and Verified With: ALSTON,C ON 12/19/16 AT 1930 BY LOY,C CRITICAL RESULT CALLED TO, READ BACK BY AND VERIFIED WITH: SHEENA HAMILTON,RN @0053  12/20/16 MKELLY,MLT    Culture (A)  Final    ESCHERICHIA COLI Confirmed Extended Spectrum Beta-Lactamase Producer (ESBL) VIRIDANS STREPTOCOCCUS THE SIGNIFICANCE OF ISOLATING THIS ORGANISM FROM A SINGLE SET OF BLOOD CULTURES WHEN MULTIPLE SETS ARE DRAWN IS UNCERTAIN. PLEASE NOTIFY THE MICROBIOLOGY DEPARTMENT WITHIN ONE WEEK IF SPECIATION AND SENSITIVITIES ARE REQUIRED. Performed at [facility], [address], [place], [place] [address]    Report Status 12/22/2016 FINAL  Final   Organism ID, Bacteria ESCHERICHIA COLI  Final      Susceptibility   Escherichia coli - MIC*    AMPICILLIN >=32 RESISTANT Resistant     CEFAZOLIN >=64 RESISTANT Resistant     CEFEPIME 2 RESISTANT Resistant     CEFTAZIDIME 16 RESISTANT Resistant     CEFTRIAXONE >=64 RESISTANT Resistant     CIPROFLOXACIN >=4 RESISTANT Resistant     GENTAMICIN <=1 SENSITIVE Sensitive     IMIPENEM <=0.25 SENSITIVE Sensitive     TRIMETH/SULFA >=320 RESISTANT Resistant     AMPICILLIN/SULBACTAM 16 INTERMEDIATE Intermediate     PIP/TAZO <=4 SENSITIVE Sensitive     Extended ESBL POSITIVE Resistant     * ESCHERICHIA COLI  Blood Culture ID Panel (Reflexed)     Status: Abnormal   Collection Time: 12/18/16 11:58 PM  Result Value Ref Range Status   Enterococcus species NOT DETECTED NOT DETECTED Corrected   Listeria monocytogenes NOT DETECTED NOT DETECTED Corrected   Staphylococcus species NOT DETECTED NOT DETECTED Corrected   Staphylococcus aureus NOT DETECTED NOT DETECTED Corrected   Streptococcus species NOT DETECTED NOT DETECTED Corrected    Comment: CORRECTED ON 01/30 AT 0054: PREVIOUSLY REPORTED AS DETECTED CRITICAL RESULT CALLED TO, READ BACK BY AND VERIFIED WITH: SHEENA HAMILTON,RN @0053  12/20/16 MKELLY,MLT   Streptococcus agalactiae NOT DETECTED NOT DETECTED Corrected    Streptococcus pneumoniae NOT DETECTED NOT DETECTED Corrected   Streptococcus pyogenes NOT DETECTED NOT DETECTED Corrected   Acinetobacter baumannii NOT DETECTED NOT DETECTED Corrected   Enterobacteriaceae species DETECTED (A) NOT DETECTED Corrected    Comment: CRITICAL RESULT CALLED TO, READ BACK BY AND VERIFIED WITH: SHEENA HAMILTON,RN @0053  12/20/16 MKELLY,MLT CORRECTED ON 01/30 AT 0054: PREVIOUSLY REPORTED AS NOT DETECTED    Enterobacter cloacae complex NOT DETECTED NOT DETECTED Corrected   Escherichia coli DETECTED (A) NOT DETECTED Corrected  Comment: CRITICAL RESULT CALLED TO, READ BACK BY AND VERIFIED WITH: SHEENA HAMILTON,RN @0053  12/20/16 MKELLY,MLT CORRECTED ON 01/30 AT 0054: PREVIOUSLY REPORTED AS NOT DETECTED    Klebsiella oxytoca NOT DETECTED NOT DETECTED Corrected   Klebsiella pneumoniae NOT DETECTED NOT DETECTED Corrected   Proteus species NOT DETECTED NOT DETECTED Corrected   Serratia marcescens NOT DETECTED NOT DETECTED Corrected   Carbapenem resistance NOT DETECTED NOT DETECTED Corrected   Haemophilus influenzae NOT DETECTED NOT DETECTED Corrected   Neisseria meningitidis NOT DETECTED NOT DETECTED Corrected   Pseudomonas aeruginosa NOT DETECTED NOT DETECTED Corrected   Candida albicans NOT DETECTED NOT DETECTED Corrected   Candida glabrata NOT DETECTED NOT DETECTED Corrected   Candida krusei NOT DETECTED NOT DETECTED Corrected   Candida parapsilosis NOT DETECTED NOT DETECTED Corrected   Candida tropicalis NOT DETECTED NOT DETECTED Corrected    Comment: Performed at [facility], [address], [place], [place] [address]  Blood culture (routine x 2)     Status: None   Collection Time: 12/19/16 12:10 AM  Result Value Ref Range Status   Specimen Description BLOOD RIGHT HAND  Final   Special Requests BOTTLES DRAWN AEROBIC ONLY 5CC ONLY  Final   Culture NO GROWTH 5 DAYS  Final   Report Status 12/24/2016 FINAL  Final  MRSA PCR Screening     Status: None    Collection Time: 12/19/16 11:39 AM  Result Value Ref Range Status   MRSA by PCR NEGATIVE NEGATIVE Final    Comment:        The GeneXpert MRSA Assay (FDA approved for NASAL specimens only), is one component of a comprehensive MRSA colonization surveillance program. It is not intended to diagnose MRSA infection nor to guide or monitor treatment for MRSA infections.   Blood Culture ID Panel (Reflexed)     Status: Abnormal   Collection Time: 12/19/16 11:00 PM  Result Value Ref Range Status   Enterococcus species NOT DETECTED NOT DETECTED Final   Vancomycin resistance NOT DETECTED NOT DETECTED Final   Listeria monocytogenes NOT DETECTED NOT DETECTED Final   Staphylococcus species NOT DETECTED NOT DETECTED Final   Staphylococcus aureus NOT DETECTED NOT DETECTED Final   Methicillin resistance NOT DETECTED NOT DETECTED Final   Streptococcus species DETECTED (A) NOT DETECTED Final    Comment: CRITICAL RESULT CALLED TO, READ BACK BY AND VERIFIED WITH: SHEENA HAMILTON,RN @0053  12/20/16 MKELLY,MLT    Streptococcus agalactiae NOT DETECTED NOT DETECTED Final   Streptococcus pneumoniae NOT DETECTED NOT DETECTED Final   Streptococcus pyogenes NOT DETECTED NOT DETECTED Final   Acinetobacter baumannii NOT DETECTED NOT DETECTED Final   Enterobacteriaceae species NOT DETECTED NOT DETECTED Final   Enterobacter cloacae complex NOT DETECTED NOT DETECTED Final   Escherichia coli NOT DETECTED NOT DETECTED Final   Klebsiella oxytoca NOT DETECTED NOT DETECTED Final   Klebsiella pneumoniae NOT DETECTED NOT DETECTED Final   Proteus species NOT DETECTED NOT DETECTED Final   Serratia marcescens NOT DETECTED NOT DETECTED Final   Carbapenem resistance NOT DETECTED NOT DETECTED Final   Haemophilus influenzae NOT DETECTED NOT DETECTED Final   Neisseria meningitidis NOT DETECTED NOT DETECTED Final   Pseudomonas aeruginosa NOT DETECTED NOT DETECTED Final   Candida albicans NOT DETECTED NOT DETECTED Final    Candida glabrata NOT DETECTED NOT DETECTED Final   Candida krusei NOT DETECTED NOT DETECTED Final   Candida parapsilosis NOT DETECTED NOT DETECTED Final   Candida tropicalis NOT DETECTED NOT DETECTED Final    Comment: Performed  at [facility], [address] [place], [place] [address]  Culture, blood (Routine X 2) w Reflex to ID Panel     Status: None (Preliminary result)   Collection Time: 12/22/16  9:14 AM  Result Value Ref Range Status   Specimen Description BLOOD RIGHT ARM  Final   Special Requests BOTTLES DRAWN AEROBIC AND ANAEROBIC 10CC  Final   Culture NO GROWTH 2 DAYS  Final   Report Status PENDING  Incomplete  Culture, blood (Routine X 2) w Reflex to ID Panel     Status: None (Preliminary result)   Collection Time: 12/22/16  9:26 AM  Result Value Ref Range Status   Specimen Description BLOOD RIGHT HAND  Final   Special Requests BOTTLES DRAWN AEROBIC ONLY 6CC  Final   Culture NO GROWTH 2 DAYS  Final   Report Status PENDING  Incomplete     Scheduled Meds: . donepezil  10 mg Oral QHS  . insulin aspart  0-5 Units Subcutaneous QHS  . insulin aspart  0-9 Units Subcutaneous TID WC  . meropenem (MERREM) IV  1 g Intravenous Q12H  . potassium chloride  40 mEq Oral Daily   Continuous Infusions: . dextrose 5 % 1,000 mL with potassium chloride 40 mEq infusion 100 mL/hr at 12/24/16 1234    Procedures/Studies: Dg Chest Portable 1 View  Result Date: 12/19/2016 CLINICAL DATA:  Sepsis and pneumonia. EXAM: PORTABLE CHEST 1 VIEW COMPARISON:  Single-view of the chest 12/18/2016 and 11/07/2016. FINDINGS: Bilateral mid and lower lung zone airspace disease does not appear changed compared to the most recent examination. Heart size is normal. No pneumothorax. Aortic atherosclerosis noted. IMPRESSION: No marked change in bilateral airspace disease most consistent with pneumonia. Atherosclerosis. Electronically Signed   By: Drusilla Kanner M.D.   On: 12/19/2016 07:23   Dg Chest Portable 1  View  Result Date: 12/19/2016 CLINICAL DATA:  Aortic atherosclerosis. EXAM: PORTABLE CHEST 1 VIEW COMPARISON:  11/07/2016 FINDINGS: Lungs are hypoinflated with heterogeneous airspace opacification over the right mid to lower lung and left base likely pneumonia. No evidence of effusion. Cardiomediastinal silhouette is within normal. There is minimal calcified plaque over the aortic arch. There are mild degenerative changes of the spine. IMPRESSION: Multifocal airspace process over the mid to lower lungs right worse than left likely pneumonia. Electronically Signed   By: Elberta Fortis M.D.   On: 12/19/2016 00:06    Pio Eatherly, DO  [facility] Pager [phone]  If 7PM-7AM, please contact night-coverage [email or web address] Password [email or web address] 12/24/2016, 3:14 PM   LOS: 5 days

## 2016-12-25 LAB — GLUCOSE, CAPILLARY
GLUCOSE-CAPILLARY: 219 mg/dL — AB (ref 65–99)
GLUCOSE-CAPILLARY: 295 mg/dL — AB (ref 65–99)
Glucose-Capillary: 257 mg/dL — ABNORMAL HIGH (ref 65–99)
Glucose-Capillary: 216 mg/dL — ABNORMAL HIGH (ref 65–99)

## 2016-12-25 LAB — CBC
HEMATOCRIT: 23.7 % — AB (ref 36.0–46.0)
HEMOGLOBIN: 7.2 g/dL — AB (ref 12.0–15.0)
MCH: 25.1 pg — ABNORMAL LOW (ref 26.0–34.0)
MCHC: 30.4 g/dL (ref 30.0–36.0)
MCV: 82.6 fL (ref 78.0–100.0)
Platelets: 75 10*3/uL — ABNORMAL LOW (ref 150–400)
RBC: 2.87 MIL/uL — ABNORMAL LOW (ref 3.87–5.11)
RDW: 17 % — ABNORMAL HIGH (ref 11.5–15.5)
WBC: 10.7 10*3/uL — AB (ref 4.0–10.5)

## 2016-12-25 LAB — BASIC METABOLIC PANEL
ANION GAP: 4 — AB (ref 5–15)
BUN: 7 mg/dL (ref 6–20)
CHLORIDE: 116 mmol/L — AB (ref 101–111)
CO2: 27 mmol/L (ref 22–32)
Calcium: 6.9 mg/dL — ABNORMAL LOW (ref 8.9–10.3)
Creatinine, Ser: 0.45 mg/dL (ref 0.44–1.00)
GFR calc Af Amer: >60 mL/min (ref >60)
Glucose, Bld: 268 mg/dL — ABNORMAL HIGH (ref 65–99)
POTASSIUM: 3.3 mmol/L — AB (ref 3.5–5.1)
SODIUM: 147 mmol/L — AB (ref 135–145)

## 2016-12-25 LAB — MAGNESIUM: Magnesium: 1.8 mg/dL (ref 1.7–2.4)

## 2016-12-25 MED ORDER — MEROPENEM 1 G IV SOLR: 1.0000 g | Freq: Two times a day (BID) | INTRAVENOUS | 0 refills | Status: AC

## 2016-12-25 MED ORDER — INSULIN ASPART 100 UNIT/ML ~~LOC~~ SOLN: SUBCUTANEOUS | 0 refills | Status: AC

## 2016-12-25 MED ORDER — HALOPERIDOL 1 MG PO TABS: 1.0000 mg | ORAL_TABLET | Freq: Three times a day (TID) | ORAL | 0 refills | Status: AC | PRN

## 2016-12-25 NOTE — Progress Notes (Addendum)
PROGRESS NOTE  Ashlee BradfordMildred F Barron ZOX:[id number] DOB: 04/29/1928 DOA: 12/18/2016 PCP: No PCP Per Patient  Brief History: 83 year old woman admitted from her skilled nursing facility on 1/28 due to shortness of breath, obtundation,and fever. She has a history of advanced dementia and is basically bedbound prior to admission. She is a DO NOT RESUSCITATE. She is found to have gram positive and ESBL EColi bacteremiaand ESBL Escherichia coli UTI. The patient also has aspiration pneumonia. The patient's antibiotics were changed to meropenem on 12/21/2016.We have had multiple palliative care interventions with the family.   Assessment/Plan: Sepsis -Secondary to aspiration pneumonia,UTI and bacteremia -Discontinue vancomycin and cefepime -Continuemeropenem -Continue IV fluids--switch to D5 -Lactic acid peaked at 7.0 -Procalcitonin 14.78-->2.98  UTI -ESBL Escherichia coli -Continuemeropenem  Bacteremia--ESBL Escherichia coli -Continue meropenem as discussed -Viridans streptococcus likely a contaminant -Surveillance blood cultures--negative to date -Plan IV Merrem through 01/03/17 -order PICC line  Aspiration pneumonia -Long discussion with the patient's family -They appear to be somewhat accepting of the risks of aspiration, but at the same time ask about a "feeding tube" -Speech therapy evaluationappreciated-->dysphagia 1 diet with thin liquids  Acute respiratory failure with hypoxia -Secondary to aspiration pneumonia -Continue supplemental oxygen--stable on 2L  Acute metabolic encephalopathy -Secondary to infectious process -12/25/16--family state pt is back to baseline  Hypernatremia -continue D5W--increase rate to 100 cc/hr-->improving -am BMP  Hypokalemia/hypomagnesemia -repleted  Thrombocytopenia -likely due to sepsis -INR 1.38 -PTT--33 -check fibrinogen--439  Hyperglycemia -ISS -A1C  Dementia without behavior disturbance -haldol prn  agitation--not to be used for dressing changes  Stage III sacral decubitus ulcer -wound care nurse consult appreciated -present at time of admission  GOC -maintain DNR -family has unrealistic expectations--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  Greater than 50% spent face to face counseling and coordinating care.   Consultants: Palliative medicine  Code Status: DNR  DVT Prophylaxis: SCDs   Procedures: As Listed in Progress Note Above  Antibiotics: Merrem 12/21/16>>> Vanc/cefepime 1/28>>>1/31   Subjective: The patient intermittently answer yes/no questions. Denies any chest pain or shortness breath. No reports of vomiting, diarrhea, started distress.  Objective: Vitals:   12/24/16 1429 12/24/16 2200 12/25/16 0653 12/25/16 1300  BP: (!) 142/74 131/72 (!) 133/56 130/68  Pulse: 91 77 74 71  Resp: 18 18 18 18   Temp: 98.6 F (37 C) 98.4 F (36.9 C) 97.5 F (36.4 C) 98 F (36.7 C)  TempSrc: Axillary Oral Oral Oral  SpO2: 98% 100% 100% 100%  Weight:      Height:        Intake/Output Summary (Last 24 hours) at 12/25/16 1600 Last data filed at 12/25/16 1200  Gross per 24 hour  Intake          1971.67 ml  Output              800 ml  Net          1171.67 ml   Weight change:  Exam:   General:  Pt is alert,  not in acute distress  HEENT: No icterus, No thrush, No neck mass, [place]/AT  Cardiovascular:  RRR, S1/S2, no rubs, no gallops  Respiratory: Diminished breath sounds in the bases. Bibasilar rales. No wheezing.  Abdomen:  Soft/+BS, non tender, non distended, no guarding  Extremities: trace edema, No lymphangitis, No petechiae, No rashes, no synovitis   Data Reviewed: I have personally reviewed following labs and imaging studies Basic Metabolic Panel:  Recent Labs Lab 12/20/16 0449 12/20/16 1214 12/21/16 0511 12/22/16 0551 12/23/16 0623 12/24/16 0616 12/25/16 0611  NA 163*  --  163*  --  156* 155* 147*  K 2.6*  --  3.8  --  2.8* 3.0* 3.3*  CL 129*  --  127*  --  124* 124* 116*  CO2 26  --  28  --  27 26 27   GLUCOSE 223*  --  148*  --  284* 298* 268*  BUN 36*  --  28*  --  14 11 7   CREATININE 0.63  --  0.57  --  0.44 0.42* 0.45  CALCIUM 7.6*  --  8.1*  --  7.2* 7.0* 6.9*  MG  --  1.9  --  1.6* 1.8 1.5* 1.8   Liver Function Tests:  Recent Labs Lab 12/18/16 2345  AST 16  ALT 10*  ALKPHOS 113  BILITOT 0.6  PROT 6.5  ALBUMIN 2.0*   No results for input(s): LIPASE, AMYLASE in the last 168 hours. No results for input(s): AMMONIA in the last 168 hours. Coagulation Profile:  Recent Labs Lab 12/23/16 0623  INR 1.38   CBC:  Recent Labs Lab 12/18/16 2345 12/21/16 0511 12/22/16 0551 12/23/16 0623 12/24/16 0616 12/25/16 0611  WBC 12.8* 19.9* 15.3* 13.5* 11.6* 10.7*  NEUTROABS 11.5*  --   --   --   --   --   HGB 10.9* 9.4* 8.0* 7.6* 7.2* 7.2*  HCT 36.5 31.9* 25.9* 25.0* 23.9* 23.7*  MCV 85.1 86.2 82.2 83.3 83.6 82.6  PLT 166 92* 63* 57* 70* 75*   Cardiac Enzymes: No results for input(s): CKTOTAL, CKMB, CKMBINDEX, TROPONINI in the last 168 hours. BNP: Invalid input(s): POCBNP CBG:  Recent Labs Lab 12/24/16 1207 12/24/16 1628 12/24/16 2157 12/25/16 0730 12/25/16 1152  GLUCAP 222* 214* 218* 257* 216*   HbA1C: No results for input(s): HGBA1C in the last 72 hours. Urine analysis:    Component Value Date/Time   COLORURINE YELLOW 12/18/2016 2345   APPEARANCEUR TURBID (A) 12/18/2016 2345   APPEARANCEUR Clear 12/18/2013 2115   LABSPEC 1.025 12/18/2016 2345   LABSPEC  1.008 12/18/2013 2115   PHURINE 5.0 12/18/2016 2345   GLUCOSEU >=500 (A) 12/18/2016 2345   GLUCOSEU Negative 12/18/2013 2115   HGBUR MODERATE (A) 12/18/2016 2345   BILIRUBINUR NEGATIVE 12/18/2016 2345   BILIRUBINUR Negative 12/18/2013 2115   KETONESUR NEGATIVE 12/18/2016 2345   PROTEINUR 100 (A) 12/18/2016 2345   UROBILINOGEN 0.2 05/11/2013 1501   NITRITE NEGATIVE 12/18/2016 2345   LEUKOCYTESUR MODERATE (A) 12/18/2016 2345   LEUKOCYTESUR Negative 12/18/2013 2115   Sepsis Labs: @LABRCNTIP (procalcitonin:4,lacticidven:4) ) Recent Results (from the past 240 hour(s))  Urine culture     Status: Abnormal   Collection Time: 12/18/16 11:45 PM  Result Value Ref Range Status   Specimen Description URINE, RANDOM  Final   Special Requests NONE  Final   Culture (A)  Final    >=100,000 COLONIES/mL ESCHERICHIA COLI Confirmed Extended Spectrum Beta-Lactamase Producer (ESBL) Performed at [facility], [address], [place], [place] [address]    Report Status 12/21/2016 FINAL  Final   Organism ID, Bacteria ESCHERICHIA  COLI (A)  Final      Susceptibility   Escherichia coli - MIC*    AMPICILLIN >=32 RESISTANT Resistant     CEFAZOLIN >=64 RESISTANT Resistant     CEFTRIAXONE >=64 RESISTANT Resistant     CIPROFLOXACIN >=4 RESISTANT Resistant     GENTAMICIN <=1 SENSITIVE Sensitive     IMIPENEM <=0.25 SENSITIVE Sensitive     NITROFURANTOIN 128 RESISTANT Resistant     TRIMETH/SULFA >=320 RESISTANT Resistant     AMPICILLIN/SULBACTAM 16 INTERMEDIATE Intermediate     PIP/TAZO <=4 SENSITIVE Sensitive     Extended ESBL POSITIVE Resistant     * >=100,000 COLONIES/mL ESCHERICHIA COLI  Blood culture (routine x 2)     Status: Abnormal   Collection Time: 12/18/16 11:58 PM  Result Value Ref Range Status   Specimen Description BLOOD RIGHT ARM  Final   Special Requests BOTTLES DRAWN AEROBIC AND ANAEROBIC 5CC EACH  Final   Culture  Setup Time   Final    GRAM NEGATIVE RODS RECOVERED FROM AEROBIC  BOTTLE Gram Stain Report Called to,Read Back By and Verified With: VALDESE DILDY LPN AT 2440 BY HFLYNT 12/19/16 GRAM POSITIVE COCCI IN CHAINS RECOVERED FROM ANAEROBIC BOTTLE Gram Stain Report Called to,Read Back By and Verified With: ALSTON,C ON 12/19/16 AT 1930 BY LOY,C CRITICAL RESULT CALLED TO, READ BACK BY AND VERIFIED WITH: SHEENA HAMILTON,RN @0053  12/20/16 MKELLY,MLT    Culture (A)  Final    ESCHERICHIA COLI Confirmed Extended Spectrum Beta-Lactamase Producer (ESBL) VIRIDANS STREPTOCOCCUS THE SIGNIFICANCE OF ISOLATING THIS ORGANISM FROM A SINGLE SET OF BLOOD CULTURES WHEN MULTIPLE SETS ARE DRAWN IS UNCERTAIN. PLEASE NOTIFY THE MICROBIOLOGY DEPARTMENT WITHIN ONE WEEK IF SPECIATION AND SENSITIVITIES ARE REQUIRED. Performed at [facility], [address], [place], [place] [address]    Report Status 12/22/2016 FINAL  Final   Organism ID, Bacteria ESCHERICHIA COLI  Final      Susceptibility   Escherichia coli - MIC*    AMPICILLIN >=32 RESISTANT Resistant     CEFAZOLIN >=64 RESISTANT Resistant     CEFEPIME 2 RESISTANT Resistant     CEFTAZIDIME 16 RESISTANT Resistant     CEFTRIAXONE >=64 RESISTANT Resistant     CIPROFLOXACIN >=4 RESISTANT Resistant     GENTAMICIN <=1 SENSITIVE Sensitive     IMIPENEM <=0.25 SENSITIVE Sensitive     TRIMETH/SULFA >=320 RESISTANT Resistant     AMPICILLIN/SULBACTAM 16 INTERMEDIATE Intermediate     PIP/TAZO <=4 SENSITIVE Sensitive     Extended ESBL POSITIVE Resistant     * ESCHERICHIA COLI  Blood Culture ID Panel (Reflexed)     Status: Abnormal   Collection Time: 12/18/16 11:58 PM  Result Value Ref Range Status   Enterococcus species NOT DETECTED NOT DETECTED Corrected   Listeria monocytogenes NOT DETECTED NOT DETECTED Corrected   Staphylococcus species NOT DETECTED NOT DETECTED Corrected   Staphylococcus aureus NOT DETECTED NOT DETECTED Corrected   Streptococcus species NOT DETECTED NOT DETECTED Corrected    Comment: CORRECTED ON 01/30 AT 0054:  PREVIOUSLY REPORTED AS DETECTED CRITICAL RESULT CALLED TO, READ BACK BY AND VERIFIED WITH: SHEENA HAMILTON,RN @0053  12/20/16 MKELLY,MLT   Streptococcus agalactiae NOT DETECTED NOT DETECTED Corrected   Streptococcus pneumoniae NOT DETECTED NOT DETECTED Corrected   Streptococcus pyogenes NOT DETECTED NOT DETECTED Corrected   Acinetobacter baumannii NOT DETECTED NOT DETECTED Corrected   Enterobacteriaceae species DETECTED (A) NOT DETECTED Corrected    Comment: CRITICAL RESULT CALLED TO, READ BACK BY AND VERIFIED WITH: SHEENA HAMILTON,RN @0053  12/20/16 MKELLY,MLT  CORRECTED ON 01/30 AT 0054: PREVIOUSLY REPORTED AS NOT DETECTED    Enterobacter cloacae complex NOT DETECTED NOT DETECTED Corrected   Escherichia coli DETECTED (A) NOT DETECTED Corrected    Comment: CRITICAL RESULT CALLED TO, READ BACK BY AND VERIFIED WITH: SHEENA HAMILTON,RN @0053  12/20/16 MKELLY,MLT CORRECTED ON 01/30 AT 0054: PREVIOUSLY REPORTED AS NOT DETECTED    Klebsiella oxytoca NOT DETECTED NOT DETECTED Corrected   Klebsiella pneumoniae NOT DETECTED NOT DETECTED Corrected   Proteus species NOT DETECTED NOT DETECTED Corrected   Serratia marcescens NOT DETECTED NOT DETECTED Corrected   Carbapenem resistance NOT DETECTED NOT DETECTED Corrected   Haemophilus influenzae NOT DETECTED NOT DETECTED Corrected   Neisseria meningitidis NOT DETECTED NOT DETECTED Corrected   Pseudomonas aeruginosa NOT DETECTED NOT DETECTED Corrected   Candida albicans NOT DETECTED NOT DETECTED Corrected   Candida glabrata NOT DETECTED NOT DETECTED Corrected   Candida krusei NOT DETECTED NOT DETECTED Corrected   Candida parapsilosis NOT DETECTED NOT DETECTED Corrected   Candida tropicalis NOT DETECTED NOT DETECTED Corrected    Comment: Performed at [facility], [address], [place], [place] [address]  Blood culture (routine x 2)     Status: None   Collection Time: 12/19/16 12:10 AM  Result Value Ref Range Status   Specimen Description BLOOD  RIGHT HAND  Final   Special Requests BOTTLES DRAWN AEROBIC ONLY 5CC ONLY  Final   Culture NO GROWTH 5 DAYS  Final   Report Status 12/24/2016 FINAL  Final  MRSA PCR Screening     Status: None   Collection Time: 12/19/16 11:39 AM  Result Value Ref Range Status   MRSA by PCR NEGATIVE NEGATIVE Final    Comment:        The GeneXpert MRSA Assay (FDA approved for NASAL specimens only), is one component of a comprehensive MRSA colonization surveillance program. It is not intended to diagnose MRSA infection nor to guide or monitor treatment for MRSA infections.   Blood Culture ID Panel (Reflexed)     Status: Abnormal   Collection Time: 12/19/16 11:00 PM  Result Value Ref Range Status   Enterococcus species NOT DETECTED NOT DETECTED Final   Vancomycin resistance NOT DETECTED NOT DETECTED Final   Listeria monocytogenes NOT DETECTED NOT DETECTED Final   Staphylococcus species NOT DETECTED NOT DETECTED Final   Staphylococcus aureus NOT DETECTED NOT DETECTED Final   Methicillin resistance NOT DETECTED NOT DETECTED Final   Streptococcus species DETECTED (A) NOT DETECTED Final    Comment: CRITICAL RESULT CALLED TO, READ BACK BY AND VERIFIED WITH: SHEENA HAMILTON,RN @0053  12/20/16 MKELLY,MLT    Streptococcus agalactiae NOT DETECTED NOT DETECTED Final   Streptococcus pneumoniae NOT DETECTED NOT DETECTED Final   Streptococcus pyogenes NOT DETECTED NOT DETECTED Final   Acinetobacter baumannii NOT DETECTED NOT DETECTED Final   Enterobacteriaceae species NOT DETECTED NOT DETECTED Final   Enterobacter cloacae complex NOT DETECTED NOT DETECTED Final   Escherichia coli NOT DETECTED NOT DETECTED Final   Klebsiella oxytoca NOT DETECTED NOT DETECTED Final   Klebsiella pneumoniae NOT DETECTED NOT DETECTED Final   Proteus species NOT DETECTED NOT DETECTED Final   Serratia marcescens NOT DETECTED NOT DETECTED Final   Carbapenem resistance NOT DETECTED NOT DETECTED Final   Haemophilus influenzae NOT  DETECTED NOT DETECTED Final   Neisseria meningitidis NOT DETECTED NOT DETECTED Final   Pseudomonas aeruginosa NOT DETECTED NOT DETECTED Final   Candida albicans NOT DETECTED NOT DETECTED Final   Candida glabrata NOT DETECTED NOT DETECTED  Final   Candida krusei NOT DETECTED NOT DETECTED Final   Candida parapsilosis NOT DETECTED NOT DETECTED Final   Candida tropicalis NOT DETECTED NOT DETECTED Final    Comment: Performed at [facility], [address], [place], [place] [address]  Culture, blood (Routine X 2) w Reflex to ID Panel     Status: None (Preliminary result)   Collection Time: 12/22/16  9:14 AM  Result Value Ref Range Status   Specimen Description BLOOD RIGHT ARM  Final   Special Requests BOTTLES DRAWN AEROBIC AND ANAEROBIC 10CC  Final   Culture NO GROWTH 3 DAYS  Final   Report Status PENDING  Incomplete  Culture, blood (Routine X 2) w Reflex to ID Panel     Status: None (Preliminary result)   Collection Time: 12/22/16  9:26 AM  Result Value Ref Range Status   Specimen Description BLOOD RIGHT HAND  Final   Special Requests BOTTLES DRAWN AEROBIC ONLY 6CC  Final   Culture NO GROWTH 3 DAYS  Final   Report Status PENDING  Incomplete     Scheduled Meds: . donepezil  10 mg Oral QHS  . insulin aspart  0-5 Units Subcutaneous QHS  . insulin aspart  0-9 Units Subcutaneous TID WC  . meropenem (MERREM) IV  1 g Intravenous Q12H  . potassium chloride  40 mEq Oral Daily   Continuous Infusions: . dextrose 5 % 1,000 mL with potassium chloride 40 mEq infusion 100 mL/hr at 12/25/16 1202    Procedures/Studies: Dg Chest Portable 1 View  Result Date: 12/19/2016 CLINICAL DATA:  Sepsis and pneumonia. EXAM: PORTABLE CHEST 1 VIEW COMPARISON:  Single-view of the chest 12/18/2016 and 11/07/2016. FINDINGS: Bilateral mid and lower lung zone airspace disease does not appear changed compared to the most recent examination. Heart size is normal. No pneumothorax. Aortic atherosclerosis noted.  IMPRESSION: No marked change in bilateral airspace disease most consistent with pneumonia. Atherosclerosis. Electronically Signed   By: Drusilla Kanner M.D.   On: 12/19/2016 07:23   Dg Chest Portable 1 View  Result Date: 12/19/2016 CLINICAL DATA:  Aortic atherosclerosis. EXAM: PORTABLE CHEST 1 VIEW COMPARISON:  11/07/2016 FINDINGS: Lungs are hypoinflated with heterogeneous airspace opacification over the right mid to lower lung and left base likely pneumonia. No evidence of effusion. Cardiomediastinal silhouette is within normal. There is minimal calcified plaque over the aortic arch. There are mild degenerative changes of the spine. IMPRESSION: Multifocal airspace process over the mid to lower lungs right worse than left likely pneumonia. Electronically Signed   By: Elberta Fortis M.D.   On: 12/19/2016 00:06    Richmond Coldren, DO  [facility] Pager [phone]  If 7PM-7AM, please contact night-coverage [email or web address] Password [email or web address] 12/25/2016, 4:00 PM   LOS: 6 days

## 2016-12-26 ENCOUNTER — Inpatient Hospital Stay (HOSPITAL_COMMUNITY): Payer: Medicare Other

## 2016-12-26 LAB — URINALYSIS, ROUTINE W REFLEX MICROSCOPIC
Bilirubin Urine: NEGATIVE
Glucose, UA: 150 mg/dL — AB
Ketones, ur: NEGATIVE mg/dL
Nitrite: NEGATIVE
Protein, ur: 30 mg/dL — AB
Specific Gravity, Urine: 1.012 (ref 1.005–1.030)
pH: 5 (ref 5.0–8.0)

## 2016-12-26 LAB — GLUCOSE, CAPILLARY
GLUCOSE-CAPILLARY: 260 mg/dL — AB (ref 65–99)
GLUCOSE-CAPILLARY: 184 mg/dL — AB (ref 65–99)
Glucose-Capillary: 242 mg/dL — ABNORMAL HIGH (ref 65–99)
Glucose-Capillary: 212 mg/dL — ABNORMAL HIGH (ref 65–99)

## 2016-12-26 LAB — BASIC METABOLIC PANEL
Anion gap: 4 — ABNORMAL LOW (ref 5–15)
BUN: 9 mg/dL (ref 6–20)
CALCIUM: 7.1 mg/dL — AB (ref 8.9–10.3)
CHLORIDE: 109 mmol/L (ref 101–111)
CO2: 27 mmol/L (ref 22–32)
CREATININE: 0.43 mg/dL — AB (ref 0.44–1.00)
GFR calc non Af Amer: >60 mL/min (ref >60)
Glucose, Bld: 294 mg/dL — ABNORMAL HIGH (ref 65–99)
Potassium: 3.6 mmol/L (ref 3.5–5.1)
SODIUM: 140 mmol/L (ref 135–145)

## 2016-12-26 LAB — CBC
HCT: 21.7 % — ABNORMAL LOW (ref 36.0–46.0)
Hemoglobin: 6.7 g/dL — CL (ref 12.0–15.0)
MCH: 25.4 pg — ABNORMAL LOW (ref 26.0–34.0)
MCHC: 30.9 g/dL (ref 30.0–36.0)
MCV: 82.2 fL (ref 78.0–100.0)
PLATELETS: 90 10*3/uL — AB (ref 150–400)
RBC: 2.64 MIL/uL — AB (ref 3.87–5.11)
RDW: 17.2 % — ABNORMAL HIGH (ref 11.5–15.5)
WBC: 10.7 10*3/uL — AB (ref 4.0–10.5)

## 2016-12-26 LAB — ABO/RH: ABO/RH(D): O POS

## 2016-12-26 LAB — PREPARE RBC (CROSSMATCH)

## 2016-12-26 MED ORDER — MAGNESIUM SULFATE 2 GM/50ML IV SOLN
2.0000 g | Freq: Once | INTRAVENOUS | Status: AC
  Administered 2016-12-26: 2 g via INTRAVENOUS
  Filled 2016-12-26: qty 50

## 2016-12-26 MED ORDER — ACETAMINOPHEN 650 MG RE SUPP
650.0000 mg | Freq: Four times a day (QID) | RECTAL | Status: DC | PRN
  Administered 2016-12-26 – 2016-12-27 (×2): 650 mg via RECTAL
  Filled 2016-12-26 (×2): qty 1

## 2016-12-26 MED ORDER — SODIUM CHLORIDE 0.9 % IV SOLN: Freq: Once | INTRAVENOUS | Status: DC

## 2016-12-26 MED ORDER — ACETAMINOPHEN 325 MG PO TABS: 650.0000 mg | ORAL_TABLET | Freq: Four times a day (QID) | ORAL | Status: DC | PRN

## 2016-12-26 NOTE — Progress Notes (Signed)
Late entry 1255:  Dr. Arbutus Leasat notified via text page of patient's temperature.  Dr. Arbutus Leasat returned page and gave order to hold off on administering blood at this time.  Orders to follow for Tylenol.

## 2016-12-26 NOTE — Progress Notes (Signed)
Dr. Arbutus Leasat notified via text page of EKG results in EPIC.

## 2016-12-26 NOTE — Progress Notes (Signed)
Daily Progress Note   Patient Name: Ashlee BradfordMildred F Barron       Date: 12/26/2016 DOB: August 10, 1928  Age: 83 y.o. MRN#: [id number] Attending Physician: Catarina Hartshornavid Tat, MD Primary Care Physician: No PCP Per Patient Admit Date: 12/18/2016  Reason for Consultation/Follow-up: Establishing goals of care and Psychosocial/spiritual support  Subjective: Ashlee Barron is lying quietly in bed. She turns her head when I ask her to look at me, but does not make eye contact. She is unable to communicate even her most basic needs. She does have puffiness in both of her hands, which seems to be uncomfortable.  Ashlee BloodgoodNephew Ashlee Barron and I talk about her decreased hemoglobin, and the need for blood cells. We also talk about redressing her PICC line. Today Ashlee HaleLeon and I discussed her inability to communicate her needs/wishes. I share my worry about how Ashlee Barron has felt through all of this that we are doing to her. Ashlee HaleLeon states he agrees.  I share that we are treating the treatable, and Ashlee HaleLeon states, "that's all we can do". He and family are continued with the idea of treating Ashlee Barron, transfer to SNF for IV antibiotics.  Length of Stay: 7  Current Medications: Scheduled Meds:  . sodium chloride   Intravenous Once  . donepezil  10 mg Oral QHS  . insulin aspart  0-5 Units Subcutaneous QHS  . insulin aspart  0-9 Units Subcutaneous TID WC  . meropenem (MERREM) IV  1 g Intravenous Q12H  . potassium chloride  40 mEq Oral Daily    Continuous Infusions: . dextrose 5 % 1,000 mL with potassium chloride 40 mEq infusion 100 mL/hr at 12/25/16 2155    PRN Meds:   Physical Exam  Constitutional: No distress.  Unable to make eye contact, moans, unable to make needs known.  HENT:  Head: Normocephalic and atraumatic.  Cardiovascular:  Normal rate and regular rhythm.   Pulmonary/Chest: Effort normal. No respiratory distress.  Abdominal: Soft. She exhibits no distension. There is no guarding.  Musculoskeletal: She exhibits no edema.  thin and frail  Neurological: She is alert.  Unable to communicate using words. Turns head when asked to look at me, does not make eye contact.  Skin: Skin is warm and dry.  Sacral wound, bilateral medial knee blistering, left heel wound with boot.  Nursing note and  vitals reviewed.           Vital Signs: BP (!) 130/56 (BP Location: Left Wrist)   Pulse 97   Temp 97.5 F (36.4 C) (Oral)   Resp 18   Ht 5\' 4"  (1.626 m)   Wt 54.4 kg (119 lb 14.9 oz)   SpO2 100%   BMI 20.59 kg/m  SpO2: SpO2: 100 % O2 Device: O2 Device: Nasal Cannula O2 Flow Rate: O2 Flow Rate (L/min): 2 L/min  Intake/output summary:  Intake/Output Summary (Last 24 hours) at 12/26/16 1245 Last data filed at 12/26/16 1610  Gross per 24 hour  Intake          2391.67 ml  Output              800 ml  Net          1591.67 ml   LBM: Last BM Date: 12/26/16 Baseline Weight: Weight: 54.4 kg (120 lb) Most recent weight: Weight: 54.4 kg (119 lb 14.9 oz)       Palliative Assessment/Data:    Flowsheet Rows   Flowsheet Row Most Recent Value  Intake Tab  Referral Department  Hospitalist  Unit at Time of Referral  ER  Palliative Care Primary Diagnosis  Neurology  Date Notified  12/19/16  Palliative Care Type  Return patient Palliative Care  Reason for referral  Clarify Goals of Care  Date of Admission  12/18/16  # of days IP prior to Palliative referral  1  Clinical Assessment  Palliative Performance Scale Score  20%  Pain Max last 24 hours  Not able to report  Pain Min Last 24 hours  Not able to report  Dyspnea Max Last 24 Hours  Not able to report  Dyspnea Min Last 24 hours  Not able to report  Psychosocial & Spiritual Assessment  Palliative Care Outcomes  Patient/Family meeting held?  Yes  Who was at the  meeting?  Daughter Ashlee Barron, son Ashlee Barron.   Patient/Family wishes: Interventions discontinued/not started   Mechanical Ventilation      Patient Active Problem List   Diagnosis Date Noted  . Bacteremia due to Escherichia coli 12/22/2016  . Thrombocytopenia (HCC) 12/22/2016  . Acute encephalopathy 12/22/2016  . Gram-negative bacteremia 12/20/2016  . Gram-positive bacteremia 12/20/2016  . Sepsis (HCC) 12/19/2016  . Acute respiratory failure (HCC) 12/19/2016  . Hospital acquired PNA 12/19/2016  . Urinary tract infection with hematuria 12/19/2016  . Hypernatremia 12/19/2016  . Pressure injury of skin 12/19/2016  . HCAP (healthcare-associated pneumonia)   . Palliative care encounter   . Goals of care, counseling/discussion   . Hypoalbuminemia 11/01/2016  . Edema of left foot 11/01/2016  . Acute cystitis without hematuria   . Sepsis secondary to UTI (HCC) 10/31/2016  . Type 2 diabetes mellitus (HCC) 10/31/2016  . Dementia 10/31/2016  . Essential hypertension 10/31/2016  . Aspiration pneumonia (HCC) 10/31/2016  . Abnormal EKG 10/31/2016  . Altered mental status 10/19/2016  . Constipation 09/09/2016  . Dehydration 09/09/2016  . Diabetes (HCC) 09/09/2016    Palliative Care Assessment & Plan   Patient Profile: 83 y.o.femalewith past medical history of Dementia with declines over the last 2 months, diabetes, hypertension, constipationadmitted on 1/28/2018with sepsis, Found to have 3 bacteria positive blood culture.   Assessment: Sepsis;IV fluids and IV antibiotics, complicated by positive blood cultures.  She continues on IV antibiotics. She will need 14 days of IV antibiotics, she is scheduled to go to [facility] [place] for treatment. End stage  dementia;patient is bedbound, under the care of [facility] at this time. Family states that their goal of bringing Ashlee Barron to the hospital was to see if she had something that was reversible. We continue to discuss  the chronic illness trajectory related to changes for dementia. Ashlee Barron and I discussed the normal changes related to skin breakdown with immobility related to dementia. Today Ashlee Barron and I discussed her inability to communicate her needs/wishes.  Recommendations/Plan:  Continue to treat the treatable but no extraordinary measures such as CPR or intubation. Discussions regarding return to the hospital, focusing on comfort and dignity need to be continue with hospice provider.  [facility] states they will continue to follow Ashlee Barron peripherally while she is at [facility] [place] receiving IV antibiotics.  Goals of Care and Additional Recommendations:  Limitations on Scope of Treatment: Treat the treatable but no CPR or intubation.  Code Status:    Code Status Orders        Start     Ordered   12/19/16 1701  Do not attempt resuscitation (DNR)  Continuous    Question Answer Comment  In the event of cardiac or respiratory ARREST Do not call a "code blue"   In the event of cardiac or respiratory ARREST Do not perform Intubation, CPR, defibrillation or ACLS   In the event of cardiac or respiratory ARREST Use medication by any route, position, wound care, and other measures to relive pain and suffering. May use oxygen, suction and manual treatment of airway obstruction as needed for comfort.      12/19/16 1700    Code Status History    Date Active Date Inactive Code Status Order ID Comments User Context   12/19/2016  3:59 AM 12/19/2016  5:00 PM DNR [id number]  Glynn Octave, MD ED   10/31/2016  8:16 PM 11/05/2016  5:14 PM DNR [id number]  Bobette Mo, MD Inpatient   10/20/2016 12:31 AM 10/21/2016  8:18 PM DNR [id number]  Pearson Grippe, MD Inpatient   09/09/2016  8:06 PM 09/12/2016  4:08 PM Full Code [id number]  Pearson Grippe, MD Inpatient    Advance Directive Documentation   Flowsheet Row Most Recent Value  Type of Advance Directive  Out of facility DNR (pink MOST or yellow form)    Pre-existing out of facility DNR order (yellow form or pink MOST form)  No data  "MOST" Form in Place?  No data       Prognosis:   < 4 weeks would not be surprising. Although Ashlee Barron is improving with IV fluids and antibiotics, she is clearly declining with functional status, nutritional status.  Based on 3 hospitalizations in the last 6 months functional decline over the last 2 months, frailty, sepsis, now complicated with 3 bacteria in her blood.  Discharge Planning:  nephew, Ashlee Barron, agrees to 2 weeks IV antibiotics at [facility] [place].  Then [facility], with the benefits of hospice.  Care plan was discussed with nursing staff, case manager, social worker, and Dr. Arbutus Leas on next rounds.   Thank you for allowing the Palliative Medicine Team to assist in the care of this patient.   Time In: 1030 Time Out: 1105 Total Time 35 minutes Prolonged Time Billed  no       Greater than 50%  of this time was spent counseling and coordinating care related to the above assessment and plan.  Katheran Awe, NP  Please contact Palliative Medicine Team phone at [phone]  for questions and concerns.

## 2016-12-26 NOTE — Progress Notes (Signed)
PROGRESS NOTE  Ashlee Barron:[id number] DOB: 1928/02/16 DOA: 12/18/2016 PCP: No PCP Per Patient Brief History: 83 year old woman admitted from her skilled nursing facility on 1/28 due to shortness of breath, obtundation,and fever. She has a history of advanced dementia and is basically bedbound prior to admission. She is a DO NOT RESUSCITATE. She is found to have gram positive and ESBL EColi bacteremiaand ESBL Escherichia coli UTI. The patient also has aspiration pneumonia. The patient's antibiotics were changed to meropenem on 12/21/2016.We have had multiple palliative care interventions with the family. The patient remains DO NOT RESUSCITATE, but family continues to hope for improvement for ultimate discharge to skilled nursing facility. On 12/26/2016, the patient spiked a fever 102.60F. Repeat blood cultures, urine culture, chest x-ray were obtained.  Assessment/Plan: Sepsis -Secondary to aspiration pneumonia,UTI and bacteremia -Discontinue vancomycin and cefepime -Continuemeropenem -Continue IV fluids--switch to D5 -Lactic acid peaked at 7.0 -Procalcitonin 14.78-->2.98  UTI -ESBL Escherichia coli -Continuemeropenem  Bacteremia--ESBL Escherichia coli -Continue meropenem as discussed -Viridans streptococcus likely a contaminant -Surveillance blood cultures--negative to date -Plan IV Merrem through 01/03/17 -12/25/16 PICC line  New Fever -12/26/2016 Temperature 102.60F -12/26/2016 personally reviewed CXR--slight improvement in bibasilar infiltrates -Repeat blood cultures -Repeat UA and urine culture  Aspiration pneumonia -Long discussion with the patient's family -They appear to be somewhat accepting of the risks of aspiration, but at the same time ask about a "feeding tube" -Speech therapy evaluationappreciated-->dysphagia 1 diet with thin liquids  SVT -12/26/16--short runs of SVT with HR 180s--lasting < 5 seconds -optimized electrolyes, K> 4 and  Mag>2 -start BB if continues to have more frequent runs  Acute respiratory failure with hypoxia -Secondary to aspiration pneumonia -Continue supplemental oxygen--stable on 2L  Acute metabolic encephalopathy -Secondary to infectious process -12/25/16--family state pt is back to baseline  Hypernatremia -continue D5W--increase rate to 100 cc/hr-->improving -am BMP  Hypokalemia/hypomagnesemia -repleted  Thrombocytopenia -likely due to sepsis -INR 1.38 -PTT--33 -check fibrinogen--439  Hyperglycemia -ISS -A1C--pending  Dementia without behavior disturbance -haldol prn agitation--not to be used for dressing changes  Stage III sacral decubitus ulcer -wound care nurse consult appreciated-->Cleanse sacral wound with NS and pat gently dry. Apply Dakin's moist gauze to wound bed. Cover with 4x4 gauze and ABD pad. Change daily.   --Cleanse left heel with NS and pat gently dry. Apply 4x4 gauze and kerlix/tape. Change daily. Offload pressure.  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  Greater than 50% spent face to face counseling and coordinating care.   Consultants: Palliative medicine  Code Status: DNR  DVT Prophylaxis: SCDs   Procedures: As Listed in Progress Note Above  Antibiotics: Merrem 12/21/16>>> Vanc/cefepime 1/28>>>1/31   Subjective: Patient is awake and alert but does not answer questions appropriately. No reports of vomiting,  respiratory distress, diarrhea,  Objective: Vitals:   12/25/16 2200 12/26/16 0447 12/26/16 1253 12/26/16 1515  BP: (!) 130/47 (!) 130/56  (!) 108/42  Pulse: 92 97  (!) 115  Resp: 18 18    Temp: 99.9 F (37.7 C) 97.5  F (36.4 C) (!) 102.5 F (39.2 C) (!) 101.3 F (38.5 C)  TempSrc: Oral Oral Axillary Axillary  SpO2: 100% 100%  99%  Weight:      Height:        Intake/Output Summary (Last 24 hours) at 12/26/16 1551 Last data filed at 12/26/16 0643  Gross per 24 hour  Intake          1991.67 ml  Output              800 ml  Net          1191.67 ml   Weight change:  Exam:   General:  Pt is alert, follows commands appropriately, not in acute distress  HEENT: No icterus, No thrush, No neck mass, [place]/AT  Cardiovascular: RRR, S1/S2, no rubs, no gallops  Respiratory: Bibasilar rales without wheezing. Good air movement.  Abdomen: Soft/+BS, non tender, non distended, no guarding  Extremities: No edema, No lymphangitis, No petechiae, No rashes, no synovitis   Data Reviewed: I have personally reviewed following labs and imaging studies Basic Metabolic Panel:  Recent Labs Lab 12/20/16 1214 12/21/16 0511 12/22/16 0551 12/23/16 0623 12/24/16 0616 12/25/16 0611 12/26/16 0613  NA  --  163*  --  156* 155* 147* 140  K  --  3.8  --  2.8* 3.0* 3.3* 3.6  CL  --  127*  --  124* 124* 116* 109  CO2  --  28  --  27 26 27 27   GLUCOSE  --  148*  --  284* 298* 268* 294*  BUN  --  28*  --  14 11 7 9   CREATININE  --  0.57  --  0.44 0.42* 0.45 0.43*  CALCIUM  --  8.1*  --  7.2* 7.0* 6.9* 7.1*  MG 1.9  --  1.6* 1.8 1.5* 1.8  --    Liver Function Tests: No results for input(s): AST, ALT, ALKPHOS, BILITOT, PROT, ALBUMIN in the last 168 hours. No results for input(s): LIPASE, AMYLASE in the last 168 hours. No results for input(s): AMMONIA in the last 168 hours. Coagulation Profile:  Recent Labs Lab 12/23/16 0623  INR 1.38   CBC:  Recent Labs Lab 12/22/16 0551 12/23/16 0623  12/24/16 0616 12/25/16 0611 12/26/16 0613  WBC 15.3* 13.5* 11.6* 10.7* 10.7*  HGB 8.0* 7.6* 7.2* 7.2* 6.7*  HCT 25.9* 25.0* 23.9* 23.7* 21.7*  MCV 82.2 83.3 83.6 82.6 82.2  PLT 63* 57* 70* 75* 90*   Cardiac Enzymes: No results for input(s): CKTOTAL, CKMB, CKMBINDEX, TROPONINI in the last 168 hours. BNP: Invalid input(s): POCBNP CBG:  Recent Labs Lab 12/25/16 1152 12/25/16 1631 12/25/16 2147 12/26/16 0747 12/26/16 1206  GLUCAP 216* 219* 295* 242* 260*   HbA1C: No results for input(s): HGBA1C in the last 72 hours. Urine analysis:    Component Value Date/Time   COLORURINE YELLOW 12/18/2016 2345   APPEARANCEUR TURBID (A) 12/18/2016 2345   APPEARANCEUR Clear 12/18/2013 2115   LABSPEC 1.025 12/18/2016 2345   LABSPEC 1.008 12/18/2013 2115   PHURINE 5.0 12/18/2016 2345   GLUCOSEU >=500 (A) 12/18/2016 2345   GLUCOSEU Negative 12/18/2013 2115   HGBUR MODERATE (A) 12/18/2016 2345   BILIRUBINUR NEGATIVE 12/18/2016 2345   BILIRUBINUR Negative 12/18/2013 2115   KETONESUR NEGATIVE 12/18/2016 2345   PROTEINUR 100 (A) 12/18/2016 2345   UROBILINOGEN 0.2 05/11/2013 1501   NITRITE NEGATIVE 12/18/2016 2345   LEUKOCYTESUR MODERATE (A) 12/18/2016 2345   LEUKOCYTESUR Negative 12/18/2013 2115   Sepsis  Labs: @LABRCNTIP (procalcitonin:4,lacticidven:4) ) Recent Results (from the past 240 hour(s))  Urine culture     Status: Abnormal   Collection Time: 12/18/16 11:45 PM  Result Value Ref Range Status   Specimen Description URINE, RANDOM  Final   Special Requests NONE  Final   Culture (A)  Final    >=100,000 COLONIES/mL ESCHERICHIA COLI Confirmed Extended Spectrum Beta-Lactamase Producer (ESBL) Performed at [facility], [address] [place], [place] [address]    Report Status 12/21/2016 FINAL  Final   Organism ID, Bacteria ESCHERICHIA COLI (A)  Final      Susceptibility   Escherichia coli - MIC*    AMPICILLIN >=32 RESISTANT Resistant     CEFAZOLIN >=64 RESISTANT Resistant      CEFTRIAXONE >=64 RESISTANT Resistant     CIPROFLOXACIN >=4 RESISTANT Resistant     GENTAMICIN <=1 SENSITIVE Sensitive     IMIPENEM <=0.25 SENSITIVE Sensitive     NITROFURANTOIN 128 RESISTANT Resistant     TRIMETH/SULFA >=320 RESISTANT Resistant     AMPICILLIN/SULBACTAM 16 INTERMEDIATE Intermediate     PIP/TAZO <=4 SENSITIVE Sensitive     Extended ESBL POSITIVE Resistant     * >=100,000 COLONIES/mL ESCHERICHIA COLI  Blood culture (routine x 2)     Status: Abnormal   Collection Time: 12/18/16 11:58 PM  Result Value Ref Range Status   Specimen Description BLOOD RIGHT ARM  Final   Special Requests BOTTLES DRAWN AEROBIC AND ANAEROBIC 5CC EACH  Final   Culture  Setup Time   Final    GRAM NEGATIVE RODS RECOVERED FROM AEROBIC BOTTLE Gram Stain Report Called to,Read Back By and Verified With: VALDESE DILDY LPN AT 2841 BY HFLYNT 12/19/16 GRAM POSITIVE COCCI IN CHAINS RECOVERED FROM ANAEROBIC BOTTLE Gram Stain Report Called to,Read Back By and Verified With: ALSTON,C ON 12/19/16 AT 1930 BY LOY,C CRITICAL RESULT CALLED TO, READ BACK BY AND VERIFIED WITH: SHEENA HAMILTON,RN @0053  12/20/16 MKELLY,MLT    Culture (A)  Final    ESCHERICHIA COLI Confirmed Extended Spectrum Beta-Lactamase Producer (ESBL) VIRIDANS STREPTOCOCCUS THE SIGNIFICANCE OF ISOLATING THIS ORGANISM FROM A SINGLE SET OF BLOOD CULTURES WHEN MULTIPLE SETS ARE DRAWN IS UNCERTAIN. PLEASE NOTIFY THE MICROBIOLOGY DEPARTMENT WITHIN ONE WEEK IF SPECIATION AND SENSITIVITIES ARE REQUIRED. Performed at [facility], [address], [place], [place] [address]    Report Status 12/22/2016 FINAL  Final   Organism ID, Bacteria ESCHERICHIA COLI  Final      Susceptibility   Escherichia coli - MIC*    AMPICILLIN >=32 RESISTANT Resistant     CEFAZOLIN >=64 RESISTANT Resistant     CEFEPIME 2 RESISTANT Resistant     CEFTAZIDIME 16 RESISTANT Resistant     CEFTRIAXONE >=64 RESISTANT Resistant     CIPROFLOXACIN >=4 RESISTANT Resistant      GENTAMICIN <=1 SENSITIVE Sensitive     IMIPENEM <=0.25 SENSITIVE Sensitive     TRIMETH/SULFA >=320 RESISTANT Resistant     AMPICILLIN/SULBACTAM 16 INTERMEDIATE Intermediate     PIP/TAZO <=4 SENSITIVE Sensitive     Extended ESBL POSITIVE Resistant     * ESCHERICHIA COLI  Blood Culture ID Panel (Reflexed)     Status: Abnormal   Collection Time: 12/18/16 11:58 PM  Result Value Ref Range Status   Enterococcus species NOT DETECTED NOT DETECTED Corrected   Listeria monocytogenes NOT DETECTED NOT DETECTED Corrected   Staphylococcus species NOT DETECTED NOT DETECTED Corrected   Staphylococcus aureus NOT DETECTED NOT DETECTED Corrected   Streptococcus species NOT DETECTED NOT  DETECTED Corrected    Comment: CORRECTED ON 01/30 AT 0054: PREVIOUSLY REPORTED AS DETECTED CRITICAL RESULT CALLED TO, READ BACK BY AND VERIFIED WITH: SHEENA HAMILTON,RN @0053  12/20/16 MKELLY,MLT   Streptococcus agalactiae NOT DETECTED NOT DETECTED Corrected   Streptococcus pneumoniae NOT DETECTED NOT DETECTED Corrected   Streptococcus pyogenes NOT DETECTED NOT DETECTED Corrected   Acinetobacter baumannii NOT DETECTED NOT DETECTED Corrected   Enterobacteriaceae species DETECTED (A) NOT DETECTED Corrected    Comment: CRITICAL RESULT CALLED TO, READ BACK BY AND VERIFIED WITH: SHEENA HAMILTON,RN @0053  12/20/16 MKELLY,MLT CORRECTED ON 01/30 AT 0054: PREVIOUSLY REPORTED AS NOT DETECTED    Enterobacter cloacae complex NOT DETECTED NOT DETECTED Corrected   Escherichia coli DETECTED (A) NOT DETECTED Corrected    Comment: CRITICAL RESULT CALLED TO, READ BACK BY AND VERIFIED WITH: SHEENA HAMILTON,RN @0053  12/20/16 MKELLY,MLT CORRECTED ON 01/30 AT 0054: PREVIOUSLY REPORTED AS NOT DETECTED    Klebsiella oxytoca NOT DETECTED NOT DETECTED Corrected   Klebsiella pneumoniae NOT DETECTED NOT DETECTED Corrected   Proteus species NOT DETECTED NOT DETECTED Corrected   Serratia marcescens NOT DETECTED NOT DETECTED Corrected   Carbapenem  resistance NOT DETECTED NOT DETECTED Corrected   Haemophilus influenzae NOT DETECTED NOT DETECTED Corrected   Neisseria meningitidis NOT DETECTED NOT DETECTED Corrected   Pseudomonas aeruginosa NOT DETECTED NOT DETECTED Corrected   Candida albicans NOT DETECTED NOT DETECTED Corrected   Candida glabrata NOT DETECTED NOT DETECTED Corrected   Candida krusei NOT DETECTED NOT DETECTED Corrected   Candida parapsilosis NOT DETECTED NOT DETECTED Corrected   Candida tropicalis NOT DETECTED NOT DETECTED Corrected    Comment: Performed at [facility], [address], [place], [place] [address]  Blood culture (routine x 2)     Status: None   Collection Time: 12/19/16 12:10 AM  Result Value Ref Range Status   Specimen Description BLOOD RIGHT HAND  Final   Special Requests BOTTLES DRAWN AEROBIC ONLY 5CC ONLY  Final   Culture NO GROWTH 5 DAYS  Final   Report Status 12/24/2016 FINAL  Final  MRSA PCR Screening     Status: None   Collection Time: 12/19/16 11:39 AM  Result Value Ref Range Status   MRSA by PCR NEGATIVE NEGATIVE Final    Comment:        The GeneXpert MRSA Assay (FDA approved for NASAL specimens only), is one component of a comprehensive MRSA colonization surveillance program. It is not intended to diagnose MRSA infection nor to guide or monitor treatment for MRSA infections.   Blood Culture ID Panel (Reflexed)     Status: Abnormal   Collection Time: 12/19/16 11:00 PM  Result Value Ref Range Status   Enterococcus species NOT DETECTED NOT DETECTED Final   Vancomycin resistance NOT DETECTED NOT DETECTED Final   Listeria monocytogenes NOT DETECTED NOT DETECTED Final   Staphylococcus species NOT DETECTED NOT DETECTED Final   Staphylococcus aureus NOT DETECTED NOT DETECTED Final   Methicillin resistance NOT DETECTED NOT DETECTED Final   Streptococcus species DETECTED (A) NOT DETECTED Final    Comment: CRITICAL RESULT CALLED TO, READ BACK BY AND VERIFIED WITH: SHEENA  HAMILTON,RN @0053  12/20/16 MKELLY,MLT    Streptococcus agalactiae NOT DETECTED NOT DETECTED Final   Streptococcus pneumoniae NOT DETECTED NOT DETECTED Final   Streptococcus pyogenes NOT DETECTED NOT DETECTED Final   Acinetobacter baumannii NOT DETECTED NOT DETECTED Final   Enterobacteriaceae species NOT DETECTED NOT DETECTED Final   Enterobacter cloacae complex NOT DETECTED NOT DETECTED Final   Escherichia coli  NOT DETECTED NOT DETECTED Final   Klebsiella oxytoca NOT DETECTED NOT DETECTED Final   Klebsiella pneumoniae NOT DETECTED NOT DETECTED Final   Proteus species NOT DETECTED NOT DETECTED Final   Serratia marcescens NOT DETECTED NOT DETECTED Final   Carbapenem resistance NOT DETECTED NOT DETECTED Final   Haemophilus influenzae NOT DETECTED NOT DETECTED Final   Neisseria meningitidis NOT DETECTED NOT DETECTED Final   Pseudomonas aeruginosa NOT DETECTED NOT DETECTED Final   Candida albicans NOT DETECTED NOT DETECTED Final   Candida glabrata NOT DETECTED NOT DETECTED Final   Candida krusei NOT DETECTED NOT DETECTED Final   Candida parapsilosis NOT DETECTED NOT DETECTED Final   Candida tropicalis NOT DETECTED NOT DETECTED Final    Comment: Performed at [facility], [address] [place], [place] [id number]  Culture, blood (Routine X 2) w Reflex to ID Panel     Status: None (Preliminary result)   Collection Time: 12/22/16  9:14 AM  Result Value Ref Range Status   Specimen Description BLOOD RIGHT ARM  Final   Special Requests BOTTLES DRAWN AEROBIC AND ANAEROBIC 10CC  Final   Culture NO GROWTH 4 DAYS  Final   Report Status PENDING  Incomplete  Culture, blood (Routine X 2) w Reflex to ID Panel     Status: None (Preliminary result)   Collection Time: 12/22/16  9:26 AM  Result Value Ref Range Status   Specimen Description BLOOD RIGHT HAND  Final   Special Requests BOTTLES DRAWN AEROBIC ONLY 6CC  Final   Culture NO GROWTH 4 DAYS  Final   Report Status PENDING  Incomplete    Culture, blood (Routine X 2) w Reflex to ID Panel     Status: None (Preliminary result)   Collection Time: 12/26/16  1:46 PM  Result Value Ref Range Status   Specimen Description BLOOD LEFT FOREARM  Final   Special Requests BOTTLES DRAWN AEROBIC ONLY 2CC  Final   Culture PENDING  Incomplete   Report Status PENDING  Incomplete     Scheduled Meds: . sodium chloride   Intravenous Once  . donepezil  10 mg Oral QHS  . insulin aspart  0-5 Units Subcutaneous QHS  . insulin aspart  0-9 Units Subcutaneous TID WC  . magnesium sulfate 1 - 4 g bolus IVPB  2 g Intravenous Once  . meropenem (MERREM) IV  1 g Intravenous Q12H  . potassium chloride  40 mEq Oral Daily   Continuous Infusions: . dextrose 5 % 1,000 mL with potassium chloride 40 mEq infusion 100 mL/hr at 12/26/16 1251    Procedures/Studies: Dg Chest Port 1 View  Result Date: 12/26/2016 CLINICAL DATA:  Fever.  Aspiration pneumonia. EXAM: PORTABLE CHEST 1 VIEW COMPARISON:  12/19/2016 and 12/10/2016 and 11/07/2016 FINDINGS: PICC appears in good position. Tortuosity of the thoracic aorta. Overall heart size is normal. Improvement in the bilateral infiltrates with slight residual atelectasis and residual right perihilar infiltrate. IMPRESSION: Improving bilateral pulmonary infiltrates. Electronically Signed   By: Francene BoyersJames  Maxwell M.D.   On: 12/26/2016 14:43   Dg Chest Portable 1 View  Result Date: 12/19/2016 CLINICAL DATA:  Sepsis and pneumonia. EXAM: PORTABLE CHEST 1 VIEW COMPARISON:  Single-view of the chest 12/18/2016 and 11/07/2016. FINDINGS: Bilateral mid and lower lung zone airspace disease does not appear changed compared to the most recent examination. Heart size is normal. No pneumothorax. Aortic atherosclerosis noted. IMPRESSION: No marked change in bilateral airspace disease most consistent with pneumonia. Atherosclerosis. Electronically Signed   By: Maisie Fushomas  Dalessio M.D.   On: 12/19/2016 07:23   Dg Chest Portable 1 View  Result  Date: 12/19/2016 CLINICAL DATA:  Aortic atherosclerosis. EXAM: PORTABLE CHEST 1 VIEW COMPARISON:  11/07/2016 FINDINGS: Lungs are hypoinflated with heterogeneous airspace opacification over the right mid to lower lung and left base likely pneumonia. No evidence of effusion. Cardiomediastinal silhouette is within normal. There is minimal calcified plaque over the aortic arch. There are mild degenerative changes of the spine. IMPRESSION: Multifocal airspace process over the mid to lower lungs right worse than left likely pneumonia. Electronically Signed   By: Elberta Fortis M.D.   On: 12/19/2016 00:06    Kaedynce Tapp, DO  [facility] Pager [phone]  If 7PM-7AM, please contact night-coverage [email or web address] Password [email or web address] 12/26/2016, 3:51 PM   LOS: 7 days

## 2016-12-26 NOTE — Progress Notes (Signed)
Md paged about critical HGB value. Continue to monitor

## 2016-12-26 NOTE — Progress Notes (Signed)
Sacral dressing changed and reinforced. Continue to monitor and change PRN

## 2016-12-26 NOTE — Progress Notes (Signed)
RN received a call from central telemetry.  Patient's heart rate 180s SVT.  Temperature 101.3.  Unable to obtain urine specimen from foley catheter - unable to use adapter on tubing to draw specimen.  Dr. Arbutus Leasat on unit and notified.  Dr. Arbutus Leasat reviewed telemetry and gave order for EKG and to change foley.

## 2016-12-27 LAB — CBC
HCT: 19.3 % — ABNORMAL LOW (ref 36.0–46.0)
Hemoglobin: 6 g/dL — CL (ref 12.0–15.0)
MCH: 25.2 pg — ABNORMAL LOW (ref 26.0–34.0)
MCHC: 31.1 g/dL (ref 30.0–36.0)
MCV: 81.1 fL (ref 78.0–100.0)
Platelets: 96 10*3/uL — ABNORMAL LOW (ref 150–400)
RBC: 2.38 MIL/uL — ABNORMAL LOW (ref 3.87–5.11)
RDW: 17.5 % — ABNORMAL HIGH (ref 11.5–15.5)
WBC: 8.9 10*3/uL (ref 4.0–10.5)

## 2016-12-27 LAB — GLUCOSE, CAPILLARY
GLUCOSE-CAPILLARY: 156 mg/dL — AB (ref 65–99)
GLUCOSE-CAPILLARY: 119 mg/dL — AB (ref 65–99)
Glucose-Capillary: 120 mg/dL — ABNORMAL HIGH (ref 65–99)
Glucose-Capillary: 150 mg/dL — ABNORMAL HIGH (ref 65–99)

## 2016-12-27 LAB — BASIC METABOLIC PANEL WITH GFR
Anion gap: 4 — ABNORMAL LOW (ref 5–15)
BUN: 8 mg/dL (ref 6–20)
CO2: 26 mmol/L (ref 22–32)
Calcium: 7 mg/dL — ABNORMAL LOW (ref 8.9–10.3)
Chloride: 105 mmol/L (ref 101–111)
Creatinine, Ser: 0.43 mg/dL — ABNORMAL LOW (ref 0.44–1.00)
GFR calc Af Amer: >60 mL/min
GFR calc non Af Amer: >60 mL/min
Glucose, Bld: 138 mg/dL — ABNORMAL HIGH (ref 65–99)
Potassium: 3.9 mmol/L (ref 3.5–5.1)
Sodium: 135 mmol/L (ref 135–145)

## 2016-12-27 LAB — CULTURE, BLOOD (ROUTINE X 2)
CULTURE: NO GROWTH
Culture: NO GROWTH

## 2016-12-27 LAB — HEMOGLOBIN A1C
HEMOGLOBIN A1C: 8.3 % — AB (ref 4.8–5.6)
MEAN PLASMA GLUCOSE: 192 mg/dL

## 2016-12-27 LAB — PREPARE RBC (CROSSMATCH)

## 2016-12-27 MED ORDER — SODIUM CHLORIDE 0.9 % IV SOLN: Freq: Once | INTRAVENOUS | Status: DC

## 2016-12-27 MED ORDER — IBUPROFEN 400 MG PO TABS
400.0000 mg | ORAL_TABLET | Freq: Once | ORAL | Status: AC
  Administered 2016-12-27: 400 mg via ORAL
  Filled 2016-12-27: qty 1

## 2016-12-27 NOTE — Clinical Social Work Note (Signed)
CSW updated [facility] pt. Facility continues to be willing to accept when medically stable.  Derenda FennelKara Zaki Gertsch, LCSW [phone]

## 2016-12-27 NOTE — Progress Notes (Signed)
Pts hemoglobin 6.0 this morning. Dr. Arbutus Leasat paged.

## 2016-12-27 NOTE — Progress Notes (Addendum)
PROGRESS NOTE  Ashlee Barron GNF:[id number] DOB: 06/17/1928 DOA: 12/18/2016 PCP: No PCP Per Patient  Brief History:  83 year old woman admitted from her skilled nursing facility on 1/28 due to shortness of breath, obtundation,and fever. She has a history of advanced dementia and is basically bedbound prior to admission. She is a DO NOT RESUSCITATE.  Family rescinded hospice care and wanted patient to be brought to hospital.  She is found to have  ESBL EColi bacteremiaand ESBL Escherichia coli UTI. The patient also has aspiration pneumonia. The patient's antibiotics were changed to meropenem on 12/21/2016.Palliative care continues to follow patient and family. The patient remains DO NOT RESUSCITATE, but family continues to hope for improvement for ultimate discharge to skilled nursing facility. Initial anticipated discharge wa on 12/26/2016; however, the patient spiked a fever 102.32F (on 12/26/16). Repeat blood cultures, urine culture, chest x-ray were obtained.  Assessment/Plan: Sepsis -Secondary to aspiration pneumonia,UTI and bacteremia -Discontinue vancomycin and cefepime -Continuemeropenem 12/21/16>>> -Continue IV fluids--switch to D5 -Lactic acid peaked at 7.0 -Procalcitonin 14.78-->2.98  UTI -ESBL Escherichia coli -Continuemeropenem  Bacteremia--ESBL Escherichia coli -Continue meropenem as discussed -Viridans streptococcus likely a contaminant -12/22/16 Surveillance blood cultures--negative -Plan IV Merrem through 01/03/17  -12/25/16 PICC line  New Fever -12/26/2016 Temperature 102.32F -12/26/2016 personally reviewed CXR--slight improvement in bibasilar infiltrates -Repeat blood cultures -Repeat UA and urine culture -continue Merrem 1/31>>> -CT chest and abdomen if persistent fever and cultures remain unrevealing -?recurrent aspiration -RN reports loose stools-->check C.diff  Aspiration pneumonia -Long discussion with the patient's family -They appear to  be somewhat accepting of the risks of aspiration, but at the same time ask about a "feeding tube" -Speech therapy evaluationappreciated-->dysphagia 1 diet with thin liquids  SVT -12/26/16--short runs of SVT with HR 180s--lasting < 5 seconds -optimized electrolyes, K> 4 and Mag>2 -start BB if continues to have more frequent runs  Anemia -partly due to dilution -12/27/16-transfuse 2 units PRBCs  Acute respiratory failure with hypoxia -Secondary to aspiration pneumonia -Continue supplemental oxygen--stable on 2L  Acute metabolic encephalopathy -Secondary to infectious process -12/25/16--family state pt is back to baseline  Hypernatremia -continue D5W--increase rate to 100 cc/hr-->improved -am BMP  Hypokalemia/hypomagnesemia -repleted  Thrombocytopenia -likely due to sepsis -INR 1.38 -PTT--33 -check fibrinogen--439  Diabetes mellitus type 2, uncontrolled -ISS -12/26/16-A1C--8.3  Dementia without behavior disturbance -haldol prn agitation--not to be used for dressing changes  Stage III sacral decubitus ulcer -wound care nurse consult appreciated-->Cleanse sacral wound with NS and pat gently dry. Apply Dakin's moist gauze to wound bed. Cover with 4x4 gauze and ABD pad. Change daily.  --Cleanse left heel with NS and pat gently dry. Apply 4x4 gauze and kerlix/tape. Change daily. Offload pressure.  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 Greater than 50% spent face to face counseling and  coordinating care.   Consultants: Palliative medicine  Code Status: DNR  DVT Prophylaxis: SCDs  Procedures: As Listed in Progress Note Above  Antibiotics: Merrem 12/21/16>>> Vanc/cefepime 1/28>>>1/31          Subjective: Patient is awake and alert. She  intermittently answers questions. She is not following commands. No reports of vomiting, respiratory distress, uncontrolled pain.  Objective: Vitals:   12/27/16 1308 12/27/16 1337 12/27/16 1355 12/27/16 1527  BP: (!) 91/41 (!) 102/39 (!) 96/37 (!) 109/43  Pulse: 89 97 95 77  Resp: 14 20 20 18   Temp: 100 F (37.8 C) 99.9 F (37.7 C) (!) 100.6 F (38.1 C) 99 F (37.2 C)  TempSrc: Axillary Axillary Axillary Axillary  SpO2: 100% 100% 99% 100%  Weight:      Height:        Intake/Output Summary (Last 24 hours) at 12/27/16 1612 Last data filed at 12/27/16 0545  Gross per 24 hour  Intake          2113.33 ml  Output              600 ml  Net          1513.33 ml   Weight change:  Exam:   General:  Pt is alert, does not follow commands appropriately, not in acute distress  HEENT: No icterus, No thrush, No neck mass, [place]/AT  Cardiovascular: RRR, S1/S2, no rubs, no gallops  Respiratory: Bibasilar rales. No wheezing. Good air movement.  Abdomen: Soft/+BS, non tender, non distended, no guarding  Extremities: trace LE edema, No lymphangitis, No petechiae, No rashes, no synovitis   Data Reviewed: I have personally reviewed following labs and imaging studies Basic Metabolic Panel:  Recent Labs Lab 12/22/16 0551 12/23/16 0623 12/24/16 0616 12/25/16 0611 12/26/16 0613 12/27/16 0443  NA  --  156* 155* 147* 140 135  K  --  2.8* 3.0* 3.3* 3.6 3.9  CL  --  124* 124* 116* 109 105  CO2  --  27 26 27 27 26   GLUCOSE  --  284* 298* 268* 294* 138*  BUN  --  14 11 7 9 8   CREATININE  --  0.44 0.42* 0.45 0.43* 0.43*  CALCIUM  --  7.2* 7.0* 6.9* 7.1* 7.0*  MG 1.6* 1.8 1.5* 1.8  --   --    Liver Function  Tests: No results for input(s): AST, ALT, ALKPHOS, BILITOT, PROT, ALBUMIN in the last 168 hours. No results for input(s): LIPASE, AMYLASE in the last 168 hours. No results for input(s): AMMONIA in the last 168 hours. Coagulation Profile:  Recent Labs Lab 12/23/16 0623  INR 1.38   CBC:  Recent Labs Lab 12/23/16 0623 12/24/16 0616 12/25/16 0611 12/26/16 0613 12/27/16 0443  WBC 13.5* 11.6* 10.7* 10.7* 8.9  HGB 7.6* 7.2* 7.2* 6.7* 6.0*  HCT 25.0* 23.9* 23.7* 21.7* 19.3*  MCV 83.3 83.6 82.6 82.2 81.1  PLT 57* 70* 75* 90* 96*   Cardiac Enzymes: No results for input(s): CKTOTAL, CKMB, CKMBINDEX, TROPONINI in the last 168 hours. BNP: Invalid input(s): POCBNP CBG:  Recent Labs Lab 12/26/16 1206 12/26/16 1648 12/26/16 2111 12/27/16 0734 12/27/16 1153  GLUCAP 260* 212* 184* 120* 150*   HbA1C:  Recent Labs  12/26/16 0613  HGBA1C 8.3*   Urine analysis:    Component Value Date/Time   COLORURINE YELLOW 12/26/2016 1259   APPEARANCEUR HAZY (A) 12/26/2016 1259   APPEARANCEUR Clear 12/18/2013 2115   LABSPEC 1.012 12/26/2016 1259   LABSPEC 1.008 12/18/2013 2115   PHURINE 5.0 12/26/2016 1259   GLUCOSEU 150 (A) 12/26/2016 1259   GLUCOSEU Negative 12/18/2013 2115   HGBUR MODERATE (A) 12/26/2016 1259   BILIRUBINUR NEGATIVE 12/26/2016 1259   BILIRUBINUR Negative 12/18/2013 2115   KETONESUR NEGATIVE 12/26/2016 1259  PROTEINUR 30 (A) 12/26/2016 1259   UROBILINOGEN 0.2 05/11/2013 1501   NITRITE NEGATIVE 12/26/2016 1259   LEUKOCYTESUR TRACE (A) 12/26/2016 1259   LEUKOCYTESUR Negative 12/18/2013 2115   Sepsis Labs: @LABRCNTIP (procalcitonin:4,lacticidven:4) ) Recent Results (from the past 240 hour(s))  Urine culture     Status: Abnormal   Collection Time: 12/18/16 11:45 PM  Result Value Ref Range Status   Specimen Description URINE, RANDOM  Final   Special Requests NONE  Final   Culture (A)  Final    >=100,000 COLONIES/mL ESCHERICHIA COLI Confirmed Extended Spectrum  Beta-Lactamase Producer (ESBL) Performed at [facility], [address], [place], [place] [address]    Report Status 12/21/2016 FINAL  Final   Organism ID, Bacteria ESCHERICHIA COLI (A)  Final      Susceptibility   Escherichia coli - MIC*    AMPICILLIN >=32 RESISTANT Resistant     CEFAZOLIN >=64 RESISTANT Resistant     CEFTRIAXONE >=64 RESISTANT Resistant     CIPROFLOXACIN >=4 RESISTANT Resistant     GENTAMICIN <=1 SENSITIVE Sensitive     IMIPENEM <=0.25 SENSITIVE Sensitive     NITROFURANTOIN 128 RESISTANT Resistant     TRIMETH/SULFA >=320 RESISTANT Resistant     AMPICILLIN/SULBACTAM 16 INTERMEDIATE Intermediate     PIP/TAZO <=4 SENSITIVE Sensitive     Extended ESBL POSITIVE Resistant     * >=100,000 COLONIES/mL ESCHERICHIA COLI  Blood culture (routine x 2)     Status: Abnormal   Collection Time: 12/18/16 11:58 PM  Result Value Ref Range Status   Specimen Description BLOOD RIGHT ARM  Final   Special Requests BOTTLES DRAWN AEROBIC AND ANAEROBIC 5CC EACH  Final   Culture  Setup Time   Final    GRAM NEGATIVE RODS RECOVERED FROM AEROBIC BOTTLE Gram Stain Report Called to,Read Back By and Verified With: VALDESE DILDY LPN AT 6045 BY HFLYNT 12/19/16 GRAM POSITIVE COCCI IN CHAINS RECOVERED FROM ANAEROBIC BOTTLE Gram Stain Report Called to,Read Back By and Verified With: ALSTON,C ON 12/19/16 AT 1930 BY LOY,C CRITICAL RESULT CALLED TO, READ BACK BY AND VERIFIED WITH: SHEENA HAMILTON,RN @0053  12/20/16 MKELLY,MLT    Culture (A)  Final    ESCHERICHIA COLI Confirmed Extended Spectrum Beta-Lactamase Producer (ESBL) VIRIDANS STREPTOCOCCUS THE SIGNIFICANCE OF ISOLATING THIS ORGANISM FROM A SINGLE SET OF BLOOD CULTURES WHEN MULTIPLE SETS ARE DRAWN IS UNCERTAIN. PLEASE NOTIFY THE MICROBIOLOGY DEPARTMENT WITHIN ONE WEEK IF SPECIATION AND SENSITIVITIES ARE REQUIRED. Performed at [facility], [address] [place], [place] [address]    Report Status 12/22/2016 FINAL  Final   Organism  ID, Bacteria ESCHERICHIA COLI  Final      Susceptibility   Escherichia coli - MIC*    AMPICILLIN >=32 RESISTANT Resistant     CEFAZOLIN >=64 RESISTANT Resistant     CEFEPIME 2 RESISTANT Resistant     CEFTAZIDIME 16 RESISTANT Resistant     CEFTRIAXONE >=64 RESISTANT Resistant     CIPROFLOXACIN >=4 RESISTANT Resistant     GENTAMICIN <=1 SENSITIVE Sensitive     IMIPENEM <=0.25 SENSITIVE Sensitive     TRIMETH/SULFA >=320 RESISTANT Resistant     AMPICILLIN/SULBACTAM 16 INTERMEDIATE Intermediate     PIP/TAZO <=4 SENSITIVE Sensitive     Extended ESBL POSITIVE Resistant     * ESCHERICHIA COLI  Blood Culture ID Panel (Reflexed)     Status: Abnormal   Collection Time: 12/18/16 11:58 PM  Result Value Ref Range Status   Enterococcus species NOT DETECTED NOT DETECTED Corrected  Listeria monocytogenes NOT DETECTED NOT DETECTED Corrected   Staphylococcus species NOT DETECTED NOT DETECTED Corrected   Staphylococcus aureus NOT DETECTED NOT DETECTED Corrected   Streptococcus species NOT DETECTED NOT DETECTED Corrected    Comment: CORRECTED ON 01/30 AT 0054: PREVIOUSLY REPORTED AS DETECTED CRITICAL RESULT CALLED TO, READ BACK BY AND VERIFIED WITH: SHEENA HAMILTON,RN @0053  12/20/16 MKELLY,MLT   Streptococcus agalactiae NOT DETECTED NOT DETECTED Corrected   Streptococcus pneumoniae NOT DETECTED NOT DETECTED Corrected   Streptococcus pyogenes NOT DETECTED NOT DETECTED Corrected   Acinetobacter baumannii NOT DETECTED NOT DETECTED Corrected   Enterobacteriaceae species DETECTED (A) NOT DETECTED Corrected    Comment: CRITICAL RESULT CALLED TO, READ BACK BY AND VERIFIED WITH: SHEENA HAMILTON,RN @0053  12/20/16 MKELLY,MLT CORRECTED ON 01/30 AT 0054: PREVIOUSLY REPORTED AS NOT DETECTED    Enterobacter cloacae complex NOT DETECTED NOT DETECTED Corrected   Escherichia coli DETECTED (A) NOT DETECTED Corrected    Comment: CRITICAL RESULT CALLED TO, READ BACK BY AND VERIFIED WITH: SHEENA HAMILTON,RN @0053   12/20/16 MKELLY,MLT CORRECTED ON 01/30 AT 0054: PREVIOUSLY REPORTED AS NOT DETECTED    Klebsiella oxytoca NOT DETECTED NOT DETECTED Corrected   Klebsiella pneumoniae NOT DETECTED NOT DETECTED Corrected   Proteus species NOT DETECTED NOT DETECTED Corrected   Serratia marcescens NOT DETECTED NOT DETECTED Corrected   Carbapenem resistance NOT DETECTED NOT DETECTED Corrected   Haemophilus influenzae NOT DETECTED NOT DETECTED Corrected   Neisseria meningitidis NOT DETECTED NOT DETECTED Corrected   Pseudomonas aeruginosa NOT DETECTED NOT DETECTED Corrected   Candida albicans NOT DETECTED NOT DETECTED Corrected   Candida glabrata NOT DETECTED NOT DETECTED Corrected   Candida krusei NOT DETECTED NOT DETECTED Corrected   Candida parapsilosis NOT DETECTED NOT DETECTED Corrected   Candida tropicalis NOT DETECTED NOT DETECTED Corrected    Comment: Performed at [facility], [address], [place], [place] [address]  Blood culture (routine x 2)     Status: None   Collection Time: 12/19/16 12:10 AM  Result Value Ref Range Status   Specimen Description BLOOD RIGHT HAND  Final   Special Requests BOTTLES DRAWN AEROBIC ONLY 5CC ONLY  Final   Culture NO GROWTH 5 DAYS  Final   Report Status 12/24/2016 FINAL  Final  MRSA PCR Screening     Status: None   Collection Time: 12/19/16 11:39 AM  Result Value Ref Range Status   MRSA by PCR NEGATIVE NEGATIVE Final    Comment:        The GeneXpert MRSA Assay (FDA approved for NASAL specimens only), is one component of a comprehensive MRSA colonization surveillance program. It is not intended to diagnose MRSA infection nor to guide or monitor treatment for MRSA infections.   Blood Culture ID Panel (Reflexed)     Status: Abnormal   Collection Time: 12/19/16 11:00 PM  Result Value Ref Range Status   Enterococcus species NOT DETECTED NOT DETECTED Final   Vancomycin resistance NOT DETECTED NOT DETECTED Final   Listeria monocytogenes NOT DETECTED NOT  DETECTED Final   Staphylococcus species NOT DETECTED NOT DETECTED Final   Staphylococcus aureus NOT DETECTED NOT DETECTED Final   Methicillin resistance NOT DETECTED NOT DETECTED Final   Streptococcus species DETECTED (A) NOT DETECTED Final    Comment: CRITICAL RESULT CALLED TO, READ BACK BY AND VERIFIED WITH: SHEENA HAMILTON,RN @0053  12/20/16 MKELLY,MLT    Streptococcus agalactiae NOT DETECTED NOT DETECTED Final   Streptococcus pneumoniae NOT DETECTED NOT DETECTED Final   Streptococcus pyogenes NOT DETECTED NOT DETECTED Final  Acinetobacter baumannii NOT DETECTED NOT DETECTED Final   Enterobacteriaceae species NOT DETECTED NOT DETECTED Final   Enterobacter cloacae complex NOT DETECTED NOT DETECTED Final   Escherichia coli NOT DETECTED NOT DETECTED Final   Klebsiella oxytoca NOT DETECTED NOT DETECTED Final   Klebsiella pneumoniae NOT DETECTED NOT DETECTED Final   Proteus species NOT DETECTED NOT DETECTED Final   Serratia marcescens NOT DETECTED NOT DETECTED Final   Carbapenem resistance NOT DETECTED NOT DETECTED Final   Haemophilus influenzae NOT DETECTED NOT DETECTED Final   Neisseria meningitidis NOT DETECTED NOT DETECTED Final   Pseudomonas aeruginosa NOT DETECTED NOT DETECTED Final   Candida albicans NOT DETECTED NOT DETECTED Final   Candida glabrata NOT DETECTED NOT DETECTED Final   Candida krusei NOT DETECTED NOT DETECTED Final   Candida parapsilosis NOT DETECTED NOT DETECTED Final   Candida tropicalis NOT DETECTED NOT DETECTED Final    Comment: Performed at [facility], [address] [place], [place] [address]  Culture, blood (Routine X 2) w Reflex to ID Panel     Status: None   Collection Time: 12/22/16  9:14 AM  Result Value Ref Range Status   Specimen Description BLOOD RIGHT ARM  Final   Special Requests BOTTLES DRAWN AEROBIC AND ANAEROBIC 10CC  Final   Culture NO GROWTH 5 DAYS  Final   Report Status 12/27/2016 FINAL  Final  Culture, blood (Routine X 2) w  Reflex to ID Panel     Status: None   Collection Time: 12/22/16  9:26 AM  Result Value Ref Range Status   Specimen Description BLOOD RIGHT HAND  Final   Special Requests BOTTLES DRAWN AEROBIC ONLY 6CC  Final   Culture NO GROWTH 5 DAYS  Final   Report Status 12/27/2016 FINAL  Final  Culture, blood (Routine X 2) w Reflex to ID Panel     Status: None (Preliminary result)   Collection Time: 12/26/16  1:46 PM  Result Value Ref Range Status   Specimen Description BLOOD LEFT FOREARM  Final   Special Requests BOTTLES DRAWN AEROBIC ONLY 2CC  Final   Culture NO GROWTH < 24 HOURS  Final   Report Status PENDING  Incomplete  Culture, blood (Routine X 2) w Reflex to ID Panel     Status: None (Preliminary result)   Collection Time: 12/26/16  3:04 PM  Result Value Ref Range Status   Specimen Description LEFT ANTECUBITAL  Final   Special Requests BOTTLES DRAWN AEROBIC AND ANAEROBIC  4CC EACH  Final   Culture NO GROWTH < 24 HOURS  Final   Report Status PENDING  Incomplete     Scheduled Meds: . sodium chloride   Intravenous Once  . sodium chloride   Intravenous Once  . donepezil  10 mg Oral QHS  . insulin aspart  0-5 Units Subcutaneous QHS  . insulin aspart  0-9 Units Subcutaneous TID WC  . meropenem (MERREM) IV  1 g Intravenous Q12H  . potassium chloride  40 mEq Oral Daily   Continuous Infusions:  Procedures/Studies: Dg Chest Port 1 View  Result Date: 12/26/2016 CLINICAL DATA:  Fever.  Aspiration pneumonia. EXAM: PORTABLE CHEST 1 VIEW COMPARISON:  12/19/2016 and 12/10/2016 and 11/07/2016 FINDINGS: PICC appears in good position. Tortuosity of the thoracic aorta. Overall heart size is normal. Improvement in the bilateral infiltrates with slight residual atelectasis and residual right perihilar infiltrate. IMPRESSION: Improving bilateral pulmonary infiltrates. Electronically Signed   By: Francene Boyers M.D.   On: 12/26/2016 14:43  Dg Chest Portable 1 View  Result Date: 12/19/2016 CLINICAL DATA:   Sepsis and pneumonia. EXAM: PORTABLE CHEST 1 VIEW COMPARISON:  Single-view of the chest 12/18/2016 and 11/07/2016. FINDINGS: Bilateral mid and lower lung zone airspace disease does not appear changed compared to the most recent examination. Heart size is normal. No pneumothorax. Aortic atherosclerosis noted. IMPRESSION: No marked change in bilateral airspace disease most consistent with pneumonia. Atherosclerosis. Electronically Signed   By: Drusilla Kanner M.D.   On: 12/19/2016 07:23   Dg Chest Portable 1 View  Result Date: 12/19/2016 CLINICAL DATA:  Aortic atherosclerosis. EXAM: PORTABLE CHEST 1 VIEW COMPARISON:  11/07/2016 FINDINGS: Lungs are hypoinflated with heterogeneous airspace opacification over the right mid to lower lung and left base likely pneumonia. No evidence of effusion. Cardiomediastinal silhouette is within normal. There is minimal calcified plaque over the aortic arch. There are mild degenerative changes of the spine. IMPRESSION: Multifocal airspace process over the mid to lower lungs right worse than left likely pneumonia. Electronically Signed   By: Elberta Fortis M.D.   On: 12/19/2016 00:06    Belladonna Lubinski, DO  [facility] Pager [phone]  If 7PM-7AM, please contact night-coverage [email or web address] Password [email or web address] 12/27/2016, 4:12 PM   LOS: 8 days

## 2016-12-27 NOTE — Progress Notes (Signed)
Notified Dr. Arbutus Leasat of VS in EPIC.  Dr. Arbutus Leasat on unit and gave order to infuse the 2 units of blood from this morning.

## 2016-12-27 NOTE — Progress Notes (Signed)
Patient's temperature 101.7.  Dr. Arbutus Leasat notified via text page.

## 2016-12-28 DIAGNOSIS — J690 Pneumonitis due to inhalation of food and vomit: Secondary | ICD-10-CM

## 2016-12-28 DIAGNOSIS — A4151 Sepsis due to Escherichia coli [E. coli]: Secondary | ICD-10-CM

## 2016-12-28 DIAGNOSIS — D696 Thrombocytopenia, unspecified: Secondary | ICD-10-CM

## 2016-12-28 DIAGNOSIS — R7881 Bacteremia: Secondary | ICD-10-CM

## 2016-12-28 DIAGNOSIS — J9601 Acute respiratory failure with hypoxia: Secondary | ICD-10-CM

## 2016-12-28 DIAGNOSIS — F0391 Unspecified dementia with behavioral disturbance: Secondary | ICD-10-CM

## 2016-12-28 DIAGNOSIS — G9340 Encephalopathy, unspecified: Secondary | ICD-10-CM

## 2016-12-28 LAB — C DIFFICILE QUICK SCREEN W PCR REFLEX
C DIFFICILE (CDIFF) INTERP: NOT DETECTED
C Diff antigen: NEGATIVE
C Diff toxin: NEGATIVE

## 2016-12-28 LAB — CBC
HEMATOCRIT: 30 % — AB (ref 36.0–46.0)
Hemoglobin: 9.7 g/dL — ABNORMAL LOW (ref 12.0–15.0)
MCH: 26.4 pg (ref 26.0–34.0)
MCHC: 32.3 g/dL (ref 30.0–36.0)
MCV: 81.7 fL (ref 78.0–100.0)
PLATELETS: 92 10*3/uL — AB (ref 150–400)
RBC: 3.67 MIL/uL — ABNORMAL LOW (ref 3.87–5.11)
RDW: 16 % — AB (ref 11.5–15.5)
WBC: 8.8 10*3/uL (ref 4.0–10.5)

## 2016-12-28 LAB — TYPE AND SCREEN
BLOOD PRODUCT EXPIRATION DATE: 201803072359
Blood Product Expiration Date: 201803072359
ISSUE DATE / TIME: 201802061324
ISSUE DATE / TIME: 201802061839
Unit Type and Rh: 5100
Unit Type and Rh: 5100

## 2016-12-28 LAB — BASIC METABOLIC PANEL
Anion gap: 5 (ref 5–15)
BUN: 8 mg/dL (ref 6–20)
CALCIUM: 7.4 mg/dL — AB (ref 8.9–10.3)
CO2: 27 mmol/L (ref 22–32)
Chloride: 107 mmol/L (ref 101–111)
Creatinine, Ser: 0.35 mg/dL — ABNORMAL LOW (ref 0.44–1.00)
GFR calc Af Amer: >60 mL/min (ref >60)
Glucose, Bld: 87 mg/dL (ref 65–99)
POTASSIUM: 3.2 mmol/L — AB (ref 3.5–5.1)
SODIUM: 139 mmol/L (ref 135–145)

## 2016-12-28 LAB — URINE CULTURE: Culture: NO GROWTH

## 2016-12-28 LAB — GLUCOSE, CAPILLARY
GLUCOSE-CAPILLARY: 92 mg/dL (ref 65–99)
Glucose-Capillary: 73 mg/dL (ref 65–99)

## 2016-12-28 MED ORDER — TRAMADOL HCL 50 MG PO TABS: 50.0000 mg | ORAL_TABLET | Freq: Two times a day (BID) | ORAL | 0 refills | Status: AC | PRN

## 2016-12-28 NOTE — Care Management Note (Signed)
Case Management Note  Patient Details  Name: Ashlee Barron MRN: [id number] Date of Birth: [birth year]  Expected Discharge Date:  12/28/16               Expected Discharge Plan:  Skilled Nursing Facility  In-House Referral:  Clinical Social Work, Hospice / Palliative Care  Discharge planning Services  CM Consult  Post Acute Care Choice:  NA Choice offered to:  NA  Status of Service:  Completed, signed off  Additional Comments: Pt discharging to SNF today. CSW has made arrangements. No CM needs for DC.   Malcolm Metrohildress, Isaiah Torok Demske, RN 12/28/2016, 1:17 PM

## 2016-12-28 NOTE — Progress Notes (Signed)
Patient is to be discharged to SNF in stable condition. Family not at bedside at this time but aware of transfer. Patient will be transferred with the PICC and foley. Report called to Zella BallRobin at [facility].

## 2016-12-28 NOTE — Clinical Social Work Placement (Signed)
   CLINICAL SOCIAL WORK PLACEMENT  NOTE  Date:  12/28/2016  Patient Details  Name: Orlean BradfordMildred F Geisler MRN: [id number] Date of Birth: 1928/08/07  Clinical Social Work is seeking post-discharge placement for this patient at the Skilled  Nursing Facility level of care (*CSW will initial, date and re-position this form in  chart as items are completed):  Yes   Patient/family provided with [facility] Clinical Social Work Department's list of facilities offering this level of care within the geographic area requested by the patient (or if unable, by the patient's family).  Yes   Patient/family informed of their freedom to choose among providers that offer the needed level of care, that participate in Medicare, Medicaid or managed care program needed by the patient, have an available bed and are willing to accept the patient.  Yes   Patient/family informed of [facility]'s ownership interest in [facility] and [facility], as well as of the fact that they are under no obligation to receive care at these facilities.  PASRR submitted to EDS on 12/23/16     PASRR number received on 12/23/16     Existing PASRR number confirmed on       FL2 transmitted to all facilities in geographic area requested by pt/family on 12/23/16     FL2 transmitted to all facilities within larger geographic area on       Patient informed that his/her managed care company has contracts with or will negotiate with certain facilities, including the following:        Yes   Patient/family informed of bed offers received.  [facility]     Physician recommends and patient chooses bed at      [facility] on 12/28/16.  [facility]     Patient family notified on 12/28/16 of transfer.  Name of family member notified:  [name]- daughter     PHYSICIAN       Additional Comment:     _______________________________________________ [name], Raihan Kimmel [place], LCSW 12/28/2016, 1:28 PM [phone]

## 2016-12-28 NOTE — Care Management Important Message (Signed)
Important Message  Patient Details  Name: Orlean BradfordMildred F Redondo MRN: [id number] Date of Birth: July 29, 1928   Medicare Important Message Given:  Yes    Malcolm MetroChildress, Christl Fessenden Demske, RN 12/28/2016, 1:17 PM

## 2016-12-28 NOTE — Discharge Summary (Signed)
Physician Discharge Summary  BRYONA FOXWORTHY RUE:[id number] DOB: [birth year] DOA: 12/18/2016  PCP: No PCP Per Patient  Admit date: 12/18/2016 Discharge date: 12/28/2016  Admitted From:Assisted living Disposition:  Skilled nursing facility, [facility]  Recommendations for Outpatient Follow-up:  1. Follow up with PCP in 1-2 weeks 2. Please obtain BMP/CBC in one week 3. Repeat chest x-ray in 2-3 weeks to ensure resolution 4. PICC line may be removed after antibiotics are completed on 2/13 5. Strongly recommend that patient be transitioned to hospice care once antibiotic course has been completed. Her risk of recurrent aspiration and overall decline is high.  Discharge Condition:Stable CODE STATUS:DO NOT RESUSCITATE Diet recommendation: Dysphagia 1 with thin liquids   Brief/Interim Summary: 83 year old woman admitted from her skilled nursing facility on 1/28 due to shortness of breath, obtundation,and fever. She has a history of advanced dementia and is basically bedbound prior to admission. She is a DO NOT RESUSCITATE.  Family rescinded hospice care and wanted patient to be brought to hospital.  She is found to have  ESBL EColi bacteremiaand ESBL Escherichia coli UTI. The patient also has aspiration pneumonia. The patient's antibiotics were changed to meropenem on 12/21/2016.Palliative care continues to follow patient and family. The patient remains DO NOT RESUSCITATE, but family continues to hope for improvement for ultimate discharge to skilled nursing facility. Initial anticipated discharge wa on 12/26/2016; however, the patient spiked a fever 102.67F (on 12/26/16). Repeat blood cultures, urine culture, chest x-ray were obtained.  Discharge Diagnoses:  Principal Problem:   Sepsis (HCC) Active Problems:   Dehydration   Dementia   Aspiration pneumonia (HCC)   Acute respiratory failure [facility])   Hospital acquired PNA   Urinary tract infection with hematuria   Hypernatremia   Pressure  injury of skin   Gram-negative bacteremia   Gram-positive bacteremia   Bacteremia due to Escherichia coli   Thrombocytopenia (HCC)   Acute encephalopathy  Sepsis -Secondary to aspiration pneumonia,UTI and bacteremia -Initially treated with vancomycin and Zosyn, but then transitioned to meropenem. -Continuemeropenem 12/21/16>>> 2/13 -Lactic acid peaked at 7.0 -Procalcitonin 14.78-->2.98  UTI -ESBL Escherichia coli -Continuemeropenem  Bacteremia--ESBL Escherichia coli -Continue meropenem as discussed -Viridans streptococcus likely a contaminant -12/22/16 Surveillance blood cultures--negative -Plan IV Merrem through 01/03/17  -12/25/16 PICC line  New Fever -12/26/2016 Temperature 102.67F -12/26/2016 personally reviewed CXR--slight improvement in bibasilar infiltrates -Repeat blood cultures have shown no growth -Repeat UA and urine culture were negative for infection -continue Merrem 1/31>>> 2/13 -I suspect fever was related to recurrent aspiration. -RN reports loose stools--> C. difficile negative -Patient has not had any fever in over 24 hours.  Aspiration pneumonia -Long discussion with the patient's family -They appear to be somewhat accepting of the risks of aspiration, but at the same time ask about a "feeding tube" -Speech therapy evaluationappreciated-->dysphagia 1 diet with thin liquids -Her risk for aspiration is high.  SVT -12/26/16--short runs of SVT with HR 180s--lasting < 5 seconds -optimized electrolyes, K> 4 and Mag>2 -Heart rate has been stable  Anemia -partly due to dilution -12/27/16-transfuse 2 units PRBCs, follow-up hemoglobin improved to 9.7  Acute respiratory failure with hypoxia -Secondary to aspiration pneumonia -Continue supplemental oxygen--stable on 2L  Acute metabolic encephalopathy -Secondary to infectious process -12/25/16--family state pt is back to baseline  Hypernatremia -Treated with  D5W-->improved  Hypokalemia/hypomagnesemia -repleted  Thrombocytopenia -likely due to sepsis Continue to follow  Diabetes mellitus type 2, uncontrolled -SSI -12/26/16-A1C--8.3  Dementia without behavior disturbance -haldol prn agitation--not to be used for dressing changes  Stage III sacral decubitus ulcer -wound care nurse consult appreciated-->Cleanse sacral wound with NS and pat gently dry. Apply Dakin's moist gauze to wound bed. Cover with 4x4 gauze and ABD pad. Change daily.  --Cleanse left heel with NS and pat gently dry. Apply 4x4 gauze and kerlix/tape. Change daily. Offload pressure.  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 Strongly suggest the patient be followed by hospice services once antibiotic course is completed. Her risk for repeated aspiration and overall decline is high.  Discharge Instructions  Discharge Instructions    Diet - low sodium heart healthy    Complete by:  As directed    Increase activity slowly    Complete by:  As directed      Allergies as of 12/28/2016   No Known Allergies     Medication List    STOP taking these medications   ALPRAZolam 0.25 MG tablet Commonly known as:  XANAX   ALPRAZolam 1 MG tablet Commonly known as:  XANAX     TAKE these medications   acetaminophen 500 MG tablet Commonly known as:  TYLENOL Take 500 mg by mouth 3 (three) times daily as needed for mild pain, fever or headache.   divalproex 250 MG DR tablet Commonly known as:  DEPAKOTE Take 250 mg by mouth 2 (two) times daily.   donepezil 10 MG tablet Commonly known as:  ARICEPT Take 10 mg by  mouth at bedtime.   ferrous sulfate 325 (65 FE) MG tablet Take 325 mg by mouth daily with breakfast.   haloperidol 1 MG tablet Commonly known as:  HALDOL Take 1 tablet (1 mg total) by mouth every 8 (eight) hours as needed for agitation. What changed:  when to take this   insulin aspart 100 UNIT/ML injection Commonly known as:  novoLOG CBG 0-150--no units; 151 to 200--2 units; 201 to 250--3 units; 251 to 300--5 units; 301 to 350--7 units; 351 to 400--9 units   LINZESS 290 MCG Caps capsule Generic drug:  linaclotide Take 290 mcg by mouth daily before breakfast.   meropenem 1 g in sodium chloride 0.9 % 100 mL Inject 1 g into the vein every 12 (twelve) hours. STOP after last dose on 01/03/17   polyethylene glycol packet Commonly known as:  MIRALAX / GLYCOLAX Take 17 g by mouth 2 (two) times daily.   traMADol 50 MG tablet Commonly known as:  ULTRAM Take 50 mg by mouth every 12 (twelve) hours as needed for moderate pain or severe pain.       No Known Allergies  Consultations:  Palliative care   Procedures/Studies: Dg Chest Port 1 View  Result Date: 12/26/2016 CLINICAL DATA:  Fever.  Aspiration pneumonia. EXAM: PORTABLE CHEST 1 VIEW COMPARISON:  12/19/2016 and 12/10/2016 and 11/07/2016 FINDINGS: PICC appears in good position. Tortuosity of the thoracic aorta. Overall heart size is normal. Improvement in the bilateral infiltrates with slight residual atelectasis and residual right perihilar infiltrate. IMPRESSION: Improving bilateral pulmonary infiltrates. Electronically Signed   By: Francene Boyers M.D.   On: 12/26/2016 14:43   Dg Chest Portable 1 View  Result Date: 12/19/2016 CLINICAL DATA:  Sepsis and pneumonia. EXAM: PORTABLE CHEST 1 VIEW COMPARISON:  Single-view of the chest 12/18/2016 and 11/07/2016. FINDINGS: Bilateral mid and lower lung zone airspace disease does not appear changed compared to the most recent examination. Heart size is normal. No pneumothorax. Aortic  atherosclerosis noted. IMPRESSION: No marked change in bilateral airspace disease most consistent with pneumonia. Atherosclerosis. Electronically Signed   By: Drusilla Kanner M.D.   On: 12/19/2016 07:23   Dg Chest Portable 1 View  Result Date: 12/19/2016 CLINICAL DATA:  Aortic atherosclerosis. EXAM: PORTABLE CHEST 1 VIEW COMPARISON:  11/07/2016 FINDINGS: Lungs are hypoinflated with heterogeneous airspace opacification over the right mid to lower lung and left base likely pneumonia. No evidence of effusion. Cardiomediastinal silhouette is within normal. There is minimal calcified plaque over the aortic arch. There are mild degenerative changes of the spine. IMPRESSION: Multifocal airspace process over the mid to lower lungs right worse than left likely pneumonia. Electronically Signed   By: Elberta Fortis M.D.   On: 12/19/2016 00:06   As Listed in Progress Note Above   Subjective: Patient is confused. Speech is difficult to comprehend  Discharge Exam: Vitals:   12/27/16 2200 12/28/16 0651  BP: (!) 124/50 122/63  Pulse: 66 67  Resp: 18 18  Temp: 98 F (36.7 C) 98.7 F (37.1 C)   Vitals:   12/27/16 1957 12/27/16 2119 12/27/16 2200 12/28/16 0651  BP:  118/61 (!) 124/50 122/63  Pulse:  74 66 67  Resp:  18 18 18   Temp:  97.8 F (36.6 C) 98 F (36.7 C) 98.7 F (37.1 C)  TempSrc:  Axillary Oral Oral  SpO2: 100% 100% 99% 98%  Weight:      Height:        General: Pt is alert, awake, not in acute distress Cardiovascular: RRR, S1/S2 +, no rubs, no gallops Respiratory: bilateral rhonchi, no wheezes Abdominal: Soft, NT, ND, bowel sounds + Extremities: no edema, no cyanosis    The results of significant diagnostics from this hospitalization (including imaging, microbiology, ancillary and laboratory) are listed below for reference.     Microbiology: Recent Results (from the past 240 hour(s))  Urine culture     Status: Abnormal   Collection Time: 12/18/16 11:45 PM  Result Value Ref  Range Status   Specimen Description URINE, RANDOM  Final   Special Requests NONE  Final   Culture (A)  Final    >=100,000 COLONIES/mL ESCHERICHIA COLI Confirmed Extended Spectrum Beta-Lactamase Producer (ESBL) Performed at [facility], [address] [place], [place] [address]    Report Status 12/21/2016 FINAL  Final   Organism ID, Bacteria ESCHERICHIA COLI (A)  Final      Susceptibility   Escherichia coli - MIC*    AMPICILLIN >=32 RESISTANT Resistant     CEFAZOLIN >=64 RESISTANT Resistant     CEFTRIAXONE >=64 RESISTANT Resistant     CIPROFLOXACIN >=4 RESISTANT Resistant     GENTAMICIN <=1 SENSITIVE Sensitive     IMIPENEM <=0.25 SENSITIVE Sensitive     NITROFURANTOIN 128 RESISTANT Resistant     TRIMETH/SULFA >=320 RESISTANT Resistant     AMPICILLIN/SULBACTAM 16 INTERMEDIATE Intermediate     PIP/TAZO <=4 SENSITIVE Sensitive     Extended ESBL POSITIVE Resistant     * >=100,000 COLONIES/mL ESCHERICHIA COLI  Blood culture (routine x 2)     Status: Abnormal   Collection Time: 12/18/16 11:58 PM  Result Value Ref Range Status   Specimen Description BLOOD RIGHT ARM  Final   Special Requests BOTTLES DRAWN AEROBIC AND ANAEROBIC 5CC EACH  Final   Culture  Setup Time   Final    GRAM NEGATIVE RODS RECOVERED FROM AEROBIC BOTTLE Gram Stain Report Called to,Read Back By and Verified With: VALDESE DILDY LPN AT 1610 BY HFLYNT 12/19/16 GRAM POSITIVE COCCI IN CHAINS RECOVERED FROM ANAEROBIC BOTTLE Gram Stain Report Called to,Read Back By and Verified With: ALSTON,C ON 12/19/16 AT 1930 BY LOY,C CRITICAL RESULT CALLED TO, READ BACK BY AND VERIFIED WITH: SHEENA HAMILTON,RN @0053  12/20/16 MKELLY,MLT    Culture (A)  Final    ESCHERICHIA COLI Confirmed Extended Spectrum Beta-Lactamase Producer (ESBL) VIRIDANS STREPTOCOCCUS THE SIGNIFICANCE OF ISOLATING THIS ORGANISM FROM A SINGLE SET OF BLOOD CULTURES WHEN MULTIPLE SETS ARE DRAWN IS UNCERTAIN. PLEASE NOTIFY THE MICROBIOLOGY DEPARTMENT WITHIN ONE  WEEK IF SPECIATION AND SENSITIVITIES ARE REQUIRED. Performed at [facility], [address] [place], [place] [address]    Report Status 12/22/2016 FINAL  Final   Organism ID, Bacteria ESCHERICHIA COLI  Final      Susceptibility   Escherichia coli - MIC*    AMPICILLIN >=32 RESISTANT Resistant     CEFAZOLIN >=64 RESISTANT Resistant     CEFEPIME 2 RESISTANT Resistant     CEFTAZIDIME 16 RESISTANT Resistant     CEFTRIAXONE >=64 RESISTANT Resistant     CIPROFLOXACIN >=4 RESISTANT Resistant     GENTAMICIN <=1 SENSITIVE Sensitive     IMIPENEM <=0.25 SENSITIVE Sensitive     TRIMETH/SULFA >=320 RESISTANT Resistant     AMPICILLIN/SULBACTAM 16 INTERMEDIATE Intermediate     PIP/TAZO <=4 SENSITIVE Sensitive     Extended ESBL POSITIVE Resistant     * ESCHERICHIA COLI  Blood Culture ID Panel (Reflexed)     Status: Abnormal   Collection Time: 12/18/16 11:58 PM  Result Value Ref Range Status   Enterococcus species NOT DETECTED NOT DETECTED Corrected   Listeria monocytogenes NOT DETECTED NOT DETECTED Corrected   Staphylococcus species NOT DETECTED NOT DETECTED Corrected   Staphylococcus aureus NOT DETECTED NOT DETECTED Corrected   Streptococcus species NOT DETECTED NOT DETECTED Corrected    Comment: CORRECTED ON 01/30 AT 0054: PREVIOUSLY REPORTED AS DETECTED CRITICAL RESULT CALLED TO, READ BACK BY AND VERIFIED WITH: SHEENA HAMILTON,RN @0053  12/20/16 MKELLY,MLT   Streptococcus agalactiae NOT DETECTED NOT DETECTED Corrected   Streptococcus pneumoniae NOT DETECTED NOT DETECTED Corrected   Streptococcus pyogenes NOT DETECTED NOT DETECTED Corrected   Acinetobacter baumannii NOT DETECTED NOT DETECTED Corrected   Enterobacteriaceae species DETECTED (A) NOT DETECTED Corrected    Comment: CRITICAL RESULT CALLED TO, READ BACK BY AND VERIFIED WITH: SHEENA HAMILTON,RN @0053  12/20/16 MKELLY,MLT CORRECTED ON 01/30 AT 0054: PREVIOUSLY REPORTED AS NOT DETECTED    Enterobacter cloacae complex NOT DETECTED  NOT DETECTED Corrected   Escherichia coli DETECTED (A) NOT DETECTED Corrected    Comment: CRITICAL RESULT CALLED TO, READ BACK BY AND VERIFIED WITH: SHEENA HAMILTON,RN @0053  12/20/16 MKELLY,MLT CORRECTED ON 01/30 AT 0054: PREVIOUSLY REPORTED AS NOT DETECTED    Klebsiella oxytoca NOT DETECTED NOT DETECTED Corrected   Klebsiella pneumoniae NOT DETECTED NOT DETECTED Corrected   Proteus species NOT DETECTED NOT DETECTED Corrected   Serratia marcescens NOT DETECTED NOT DETECTED Corrected   Carbapenem resistance NOT DETECTED NOT DETECTED Corrected   Haemophilus influenzae NOT DETECTED NOT DETECTED Corrected   Neisseria meningitidis NOT DETECTED NOT DETECTED Corrected   Pseudomonas aeruginosa NOT DETECTED NOT DETECTED Corrected   Candida albicans NOT DETECTED NOT DETECTED Corrected   Candida glabrata NOT DETECTED NOT DETECTED Corrected   Candida krusei NOT DETECTED NOT DETECTED  Corrected   Candida parapsilosis NOT DETECTED NOT DETECTED Corrected   Candida tropicalis NOT DETECTED NOT DETECTED Corrected    Comment: Performed at [facility], [address] [place], [place] [address]  Blood culture (routine x 2)     Status: None   Collection Time: 12/19/16 12:10 AM  Result Value Ref Range Status   Specimen Description BLOOD RIGHT HAND  Final   Special Requests BOTTLES DRAWN AEROBIC ONLY 5CC ONLY  Final   Culture NO GROWTH 5 DAYS  Final   Report Status 12/24/2016 FINAL  Final  MRSA PCR Screening     Status: None   Collection Time: 12/19/16 11:39 AM  Result Value Ref Range Status   MRSA by PCR NEGATIVE NEGATIVE Final    Comment:        The GeneXpert MRSA Assay (FDA approved for NASAL specimens only), is one component of a comprehensive MRSA colonization surveillance program. It is not intended to diagnose MRSA infection nor to guide or monitor treatment for MRSA infections.   Blood Culture ID Panel (Reflexed)     Status: Abnormal   Collection Time: 12/19/16 11:00 PM  Result  Value Ref Range Status   Enterococcus species NOT DETECTED NOT DETECTED Final   Vancomycin resistance NOT DETECTED NOT DETECTED Final   Listeria monocytogenes NOT DETECTED NOT DETECTED Final   Staphylococcus species NOT DETECTED NOT DETECTED Final   Staphylococcus aureus NOT DETECTED NOT DETECTED Final   Methicillin resistance NOT DETECTED NOT DETECTED Final   Streptococcus species DETECTED (A) NOT DETECTED Final    Comment: CRITICAL RESULT CALLED TO, READ BACK BY AND VERIFIED WITH: SHEENA HAMILTON,RN @0053  12/20/16 MKELLY,MLT    Streptococcus agalactiae NOT DETECTED NOT DETECTED Final   Streptococcus pneumoniae NOT DETECTED NOT DETECTED Final   Streptococcus pyogenes NOT DETECTED NOT DETECTED Final   Acinetobacter baumannii NOT DETECTED NOT DETECTED Final   Enterobacteriaceae species NOT DETECTED NOT DETECTED Final   Enterobacter cloacae complex NOT DETECTED NOT DETECTED Final   Escherichia coli NOT DETECTED NOT DETECTED Final   Klebsiella oxytoca NOT DETECTED NOT DETECTED Final   Klebsiella pneumoniae NOT DETECTED NOT DETECTED Final   Proteus species NOT DETECTED NOT DETECTED Final   Serratia marcescens NOT DETECTED NOT DETECTED Final   Carbapenem resistance NOT DETECTED NOT DETECTED Final   Haemophilus influenzae NOT DETECTED NOT DETECTED Final   Neisseria meningitidis NOT DETECTED NOT DETECTED Final   Pseudomonas aeruginosa NOT DETECTED NOT DETECTED Final   Candida albicans NOT DETECTED NOT DETECTED Final   Candida glabrata NOT DETECTED NOT DETECTED Final   Candida krusei NOT DETECTED NOT DETECTED Final   Candida parapsilosis NOT DETECTED NOT DETECTED Final   Candida tropicalis NOT DETECTED NOT DETECTED Final    Comment: Performed at [facility], [address] [place], [place] [address]  Culture, blood (Routine X 2) w Reflex to ID Panel     Status: None   Collection Time: 12/22/16  9:14 AM  Result Value Ref Range Status   Specimen Description BLOOD RIGHT ARM  Final    Special Requests BOTTLES DRAWN AEROBIC AND ANAEROBIC 10CC  Final   Culture NO GROWTH 5 DAYS  Final   Report Status 12/27/2016 FINAL  Final  Culture, blood (Routine X 2) w Reflex to ID Panel     Status: None   Collection Time: 12/22/16  9:26 AM  Result Value Ref Range Status   Specimen Description BLOOD RIGHT HAND  Final   Special Requests  BOTTLES DRAWN AEROBIC ONLY 6CC  Final   Culture NO GROWTH 5 DAYS  Final   Report Status 12/27/2016 FINAL  Final  Urine culture     Status: None   Collection Time: 12/26/16 12:59 PM  Result Value Ref Range Status   Specimen Description URINE, CLEAN CATCH  Final   Special Requests NONE  Final   Culture   Final    NO GROWTH Performed at [facility], [address], [place], [place] [address]    Report Status 12/28/2016 FINAL  Final  Culture, blood (Routine X 2) w Reflex to ID Panel     Status: None (Preliminary result)   Collection Time: 12/26/16  1:46 PM  Result Value Ref Range Status   Specimen Description BLOOD LEFT FOREARM  Final   Special Requests BOTTLES DRAWN AEROBIC ONLY 2CC  Final   Culture NO GROWTH 2 DAYS  Final   Report Status PENDING  Incomplete  Culture, blood (Routine X 2) w Reflex to ID Panel     Status: None (Preliminary result)   Collection Time: 12/26/16  3:04 PM  Result Value Ref Range Status   Specimen Description LEFT ANTECUBITAL  Final   Special Requests BOTTLES DRAWN AEROBIC AND ANAEROBIC  4CC EACH  Final   Culture NO GROWTH 2 DAYS  Final   Report Status PENDING  Incomplete  C difficile quick scan w PCR reflex     Status: None   Collection Time: 12/28/16  6:34 AM  Result Value Ref Range Status   C Diff antigen NEGATIVE NEGATIVE Final   C Diff toxin NEGATIVE NEGATIVE Final   C Diff interpretation No C. difficile detected.  Final     Labs: BNP (last 3 results)  Recent Labs  11/03/16 0122  BNP 106.0*   Basic Metabolic Panel:  Recent Labs Lab 12/22/16 0551  12/23/16 0623 12/24/16 0616 12/25/16 6045  12/26/16 0613 12/27/16 0443 12/28/16 0604  NA  --   < > 156* 155* 147* 140 135 139  K  --   < > 2.8* 3.0* 3.3* 3.6 3.9 3.2*  CL  --   < > 124* 124* 116* 109 105 107  CO2  --   < > 27 26 27 27 26 27   GLUCOSE  --   < > 284* 298* 268* 294* 138* 87  BUN  --   < > 14 11 7 9 8 8   CREATININE  --   < > 0.44 0.42* 0.45 0.43* 0.43* 0.35*  CALCIUM  --   < > 7.2* 7.0* 6.9* 7.1* 7.0* 7.4*  MG 1.6*  --  1.8 1.5* 1.8  --   --   --   < > = values in this interval not displayed. Liver Function Tests: No results for input(s): AST, ALT, ALKPHOS, BILITOT, PROT, ALBUMIN in the last 168 hours. No results for input(s): LIPASE, AMYLASE in the last 168 hours. No results for input(s): AMMONIA in the last 168 hours. CBC:  Recent Labs Lab 12/24/16 0616 12/25/16 0611 12/26/16 0613 12/27/16 0443 12/28/16 0604  WBC 11.6* 10.7* 10.7* 8.9 8.8  HGB 7.2* 7.2* 6.7* 6.0* 9.7*  HCT 23.9* 23.7* 21.7* 19.3* 30.0*  MCV 83.6 82.6 82.2 81.1 81.7  PLT 70* 75* 90* 96* 92*   Cardiac Enzymes: No results for input(s): CKTOTAL, CKMB, CKMBINDEX, TROPONINI in the last 168 hours. BNP: Invalid input(s): POCBNP CBG:  Recent Labs Lab 12/27/16 0734 12/27/16 1153 12/27/16 1644 12/27/16 2113 12/28/16 0740  GLUCAP  120* 150* 156* 119* 73   D-Dimer No results for input(s): DDIMER in the last 72 hours. Hgb A1c  Recent Labs  12/26/16 0613  HGBA1C 8.3*   Lipid Profile No results for input(s): CHOL, HDL, LDLCALC, TRIG, CHOLHDL, LDLDIRECT in the last 72 hours. Thyroid function studies No results for input(s): TSH, T4TOTAL, T3FREE, THYROIDAB in the last 72 hours.  Invalid input(s): FREET3 Anemia work up No results for input(s): VITAMINB12, FOLATE, FERRITIN, TIBC, IRON, RETICCTPCT in the last 72 hours. Urinalysis    Component Value Date/Time   COLORURINE YELLOW 12/26/2016 1259   APPEARANCEUR HAZY (A) 12/26/2016 1259   APPEARANCEUR Clear 12/18/2013 2115   LABSPEC 1.012 12/26/2016 1259   LABSPEC 1.008 12/18/2013  2115   PHURINE 5.0 12/26/2016 1259   GLUCOSEU 150 (A) 12/26/2016 1259   GLUCOSEU Negative 12/18/2013 2115   HGBUR MODERATE (A) 12/26/2016 1259   BILIRUBINUR NEGATIVE 12/26/2016 1259   BILIRUBINUR Negative 12/18/2013 2115   KETONESUR NEGATIVE 12/26/2016 1259   PROTEINUR 30 (A) 12/26/2016 1259   UROBILINOGEN 0.2 05/11/2013 1501   NITRITE NEGATIVE 12/26/2016 1259   LEUKOCYTESUR TRACE (A) 12/26/2016 1259   LEUKOCYTESUR Negative 12/18/2013 2115   Sepsis Labs Invalid input(s): PROCALCITONIN,  WBC,  LACTICIDVEN Microbiology Recent Results (from the past 240 hour(s))  Urine culture     Status: Abnormal   Collection Time: 12/18/16 11:45 PM  Result Value Ref Range Status   Specimen Description URINE, RANDOM  Final   Special Requests NONE  Final   Culture (A)  Final    >=100,000 COLONIES/mL ESCHERICHIA COLI Confirmed Extended Spectrum Beta-Lactamase Producer (ESBL) Performed at [facility], [address], [place], [place] [address]    Report Status 12/21/2016 FINAL  Final   Organism ID, Bacteria ESCHERICHIA COLI (A)  Final      Susceptibility   Escherichia coli - MIC*    AMPICILLIN >=32 RESISTANT Resistant     CEFAZOLIN >=64 RESISTANT Resistant     CEFTRIAXONE >=64 RESISTANT Resistant     CIPROFLOXACIN >=4 RESISTANT Resistant     GENTAMICIN <=1 SENSITIVE Sensitive     IMIPENEM <=0.25 SENSITIVE Sensitive     NITROFURANTOIN 128 RESISTANT Resistant     TRIMETH/SULFA >=320 RESISTANT Resistant     AMPICILLIN/SULBACTAM 16 INTERMEDIATE Intermediate     PIP/TAZO <=4 SENSITIVE Sensitive     Extended ESBL POSITIVE Resistant     * >=100,000 COLONIES/mL ESCHERICHIA COLI  Blood culture (routine x 2)     Status: Abnormal   Collection Time: 12/18/16 11:58 PM  Result Value Ref Range Status   Specimen Description BLOOD RIGHT ARM  Final   Special Requests BOTTLES DRAWN AEROBIC AND ANAEROBIC 5CC EACH  Final   Culture  Setup Time   Final    GRAM NEGATIVE RODS RECOVERED FROM AEROBIC  BOTTLE Gram Stain Report Called to,Read Back By and Verified With: VALDESE DILDY LPN AT 6045 BY HFLYNT 12/19/16 GRAM POSITIVE COCCI IN CHAINS RECOVERED FROM ANAEROBIC BOTTLE Gram Stain Report Called to,Read Back By and Verified With: ALSTON,C ON 12/19/16 AT 1930 BY LOY,C CRITICAL RESULT CALLED TO, READ BACK BY AND VERIFIED WITH: SHEENA HAMILTON,RN @0053  12/20/16 MKELLY,MLT    Culture (A)  Final    ESCHERICHIA COLI Confirmed Extended Spectrum Beta-Lactamase Producer (ESBL) VIRIDANS STREPTOCOCCUS THE SIGNIFICANCE OF ISOLATING THIS ORGANISM FROM A SINGLE SET OF BLOOD CULTURES WHEN MULTIPLE SETS ARE DRAWN IS UNCERTAIN. PLEASE NOTIFY THE MICROBIOLOGY DEPARTMENT WITHIN ONE WEEK IF SPECIATION AND SENSITIVITIES ARE REQUIRED. Performed at [facility], [address] [place], [place] [address]    Report Status 12/22/2016 FINAL  Final   Organism ID, Bacteria ESCHERICHIA COLI  Final      Susceptibility   Escherichia coli - MIC*    AMPICILLIN >=32 RESISTANT Resistant     CEFAZOLIN >=64 RESISTANT Resistant     CEFEPIME 2 RESISTANT Resistant     CEFTAZIDIME 16 RESISTANT Resistant     CEFTRIAXONE >=64 RESISTANT Resistant     CIPROFLOXACIN >=4 RESISTANT Resistant     GENTAMICIN <=1 SENSITIVE Sensitive     IMIPENEM <=0.25 SENSITIVE Sensitive     TRIMETH/SULFA >=320 RESISTANT Resistant     AMPICILLIN/SULBACTAM 16 INTERMEDIATE Intermediate     PIP/TAZO <=4 SENSITIVE Sensitive     Extended ESBL POSITIVE Resistant     * ESCHERICHIA COLI  Blood Culture ID Panel (Reflexed)     Status: Abnormal   Collection Time: 12/18/16 11:58 PM  Result Value Ref Range Status   Enterococcus species NOT DETECTED NOT DETECTED Corrected   Listeria monocytogenes NOT DETECTED NOT DETECTED Corrected   Staphylococcus species NOT DETECTED NOT DETECTED Corrected   Staphylococcus aureus NOT DETECTED NOT DETECTED Corrected   Streptococcus species NOT DETECTED NOT DETECTED Corrected    Comment: CORRECTED ON 01/30 AT 0054:  PREVIOUSLY REPORTED AS DETECTED CRITICAL RESULT CALLED TO, READ BACK BY AND VERIFIED WITH: SHEENA HAMILTON,RN @0053  12/20/16 MKELLY,MLT   Streptococcus agalactiae NOT DETECTED NOT DETECTED Corrected   Streptococcus pneumoniae NOT DETECTED NOT DETECTED Corrected   Streptococcus pyogenes NOT DETECTED NOT DETECTED Corrected   Acinetobacter baumannii NOT DETECTED NOT DETECTED Corrected   Enterobacteriaceae species DETECTED (A) NOT DETECTED Corrected    Comment: CRITICAL RESULT CALLED TO, READ BACK BY AND VERIFIED WITH: SHEENA HAMILTON,RN @0053  12/20/16 MKELLY,MLT CORRECTED ON 01/30 AT 0054: PREVIOUSLY REPORTED AS NOT DETECTED    Enterobacter cloacae complex NOT DETECTED NOT DETECTED Corrected   Escherichia coli DETECTED (A) NOT DETECTED Corrected    Comment: CRITICAL RESULT CALLED TO, READ BACK BY AND VERIFIED WITH: SHEENA HAMILTON,RN @0053  12/20/16 MKELLY,MLT CORRECTED ON 01/30 AT 0054: PREVIOUSLY REPORTED AS NOT DETECTED    Klebsiella oxytoca NOT DETECTED NOT DETECTED Corrected   Klebsiella pneumoniae NOT DETECTED NOT DETECTED Corrected   Proteus species NOT DETECTED NOT DETECTED Corrected   Serratia marcescens NOT DETECTED NOT DETECTED Corrected   Carbapenem resistance NOT DETECTED NOT DETECTED Corrected   Haemophilus influenzae NOT DETECTED NOT DETECTED Corrected   Neisseria meningitidis NOT DETECTED NOT DETECTED Corrected   Pseudomonas aeruginosa NOT DETECTED NOT DETECTED Corrected   Candida albicans NOT DETECTED NOT DETECTED Corrected   Candida glabrata NOT DETECTED NOT DETECTED Corrected   Candida krusei NOT DETECTED NOT DETECTED Corrected   Candida parapsilosis NOT DETECTED NOT DETECTED Corrected   Candida tropicalis NOT DETECTED NOT DETECTED Corrected    Comment: Performed at [facility], [address], [place], [place] [address]  Blood culture (routine x 2)     Status: None   Collection Time: 12/19/16 12:10 AM  Result Value Ref Range Status   Specimen Description BLOOD  RIGHT HAND  Final   Special Requests BOTTLES DRAWN AEROBIC ONLY 5CC ONLY  Final   Culture NO GROWTH 5 DAYS  Final   Report Status 12/24/2016 FINAL  Final  MRSA PCR Screening     Status: None   Collection Time: 12/19/16 11:39 AM  Result Value Ref Range Status   MRSA by PCR NEGATIVE NEGATIVE Final    Comment:  The GeneXpert MRSA Assay (FDA approved for NASAL specimens only), is one component of a comprehensive MRSA colonization surveillance program. It is not intended to diagnose MRSA infection nor to guide or monitor treatment for MRSA infections.   Blood Culture ID Panel (Reflexed)     Status: Abnormal   Collection Time: 12/19/16 11:00 PM  Result Value Ref Range Status   Enterococcus species NOT DETECTED NOT DETECTED Final   Vancomycin resistance NOT DETECTED NOT DETECTED Final   Listeria monocytogenes NOT DETECTED NOT DETECTED Final   Staphylococcus species NOT DETECTED NOT DETECTED Final   Staphylococcus aureus NOT DETECTED NOT DETECTED Final   Methicillin resistance NOT DETECTED NOT DETECTED Final   Streptococcus species DETECTED (A) NOT DETECTED Final    Comment: CRITICAL RESULT CALLED TO, READ BACK BY AND VERIFIED WITH: SHEENA HAMILTON,RN @0053  12/20/16 MKELLY,MLT    Streptococcus agalactiae NOT DETECTED NOT DETECTED Final   Streptococcus pneumoniae NOT DETECTED NOT DETECTED Final   Streptococcus pyogenes NOT DETECTED NOT DETECTED Final   Acinetobacter baumannii NOT DETECTED NOT DETECTED Final   Enterobacteriaceae species NOT DETECTED NOT DETECTED Final   Enterobacter cloacae complex NOT DETECTED NOT DETECTED Final   Escherichia coli NOT DETECTED NOT DETECTED Final   Klebsiella oxytoca NOT DETECTED NOT DETECTED Final   Klebsiella pneumoniae NOT DETECTED NOT DETECTED Final   Proteus species NOT DETECTED NOT DETECTED Final   Serratia marcescens NOT DETECTED NOT DETECTED Final   Carbapenem resistance NOT DETECTED NOT DETECTED Final   Haemophilus influenzae NOT  DETECTED NOT DETECTED Final   Neisseria meningitidis NOT DETECTED NOT DETECTED Final   Pseudomonas aeruginosa NOT DETECTED NOT DETECTED Final   Candida albicans NOT DETECTED NOT DETECTED Final   Candida glabrata NOT DETECTED NOT DETECTED Final   Candida krusei NOT DETECTED NOT DETECTED Final   Candida parapsilosis NOT DETECTED NOT DETECTED Final   Candida tropicalis NOT DETECTED NOT DETECTED Final    Comment: Performed at [facility], [address] [place], [place] [address]  Culture, blood (Routine X 2) w Reflex to ID Panel     Status: None   Collection Time: 12/22/16  9:14 AM  Result Value Ref Range Status   Specimen Description BLOOD RIGHT ARM  Final   Special Requests BOTTLES DRAWN AEROBIC AND ANAEROBIC 10CC  Final   Culture NO GROWTH 5 DAYS  Final   Report Status 12/27/2016 FINAL  Final  Culture, blood (Routine X 2) w Reflex to ID Panel     Status: None   Collection Time: 12/22/16  9:26 AM  Result Value Ref Range Status   Specimen Description BLOOD RIGHT HAND  Final   Special Requests BOTTLES DRAWN AEROBIC ONLY 6CC  Final   Culture NO GROWTH 5 DAYS  Final   Report Status 12/27/2016 FINAL  Final  Urine culture     Status: None   Collection Time: 12/26/16 12:59 PM  Result Value Ref Range Status   Specimen Description URINE, CLEAN CATCH  Final   Special Requests NONE  Final   Culture   Final    NO GROWTH Performed at [facility], [address], [place], [place] [address]    Report Status 12/28/2016 FINAL  Final  Culture, blood (Routine X 2) w Reflex to ID Panel     Status: None (Preliminary result)   Collection Time: 12/26/16  1:46 PM  Result Value Ref Range Status   Specimen Description BLOOD LEFT FOREARM  Final   Special Requests BOTTLES  DRAWN AEROBIC ONLY 2CC  Final   Culture NO GROWTH 2 DAYS  Final   Report Status PENDING  Incomplete  Culture, blood (Routine X 2) w Reflex to ID Panel     Status: None (Preliminary result)   Collection Time: 12/26/16   3:04 PM  Result Value Ref Range Status   Specimen Description LEFT ANTECUBITAL  Final   Special Requests BOTTLES DRAWN AEROBIC AND ANAEROBIC  4CC EACH  Final   Culture NO GROWTH 2 DAYS  Final   Report Status PENDING  Incomplete  C difficile quick scan w PCR reflex     Status: None   Collection Time: 12/28/16  6:34 AM  Result Value Ref Range Status   C Diff antigen NEGATIVE NEGATIVE Final   C Diff toxin NEGATIVE NEGATIVE Final   C Diff interpretation No C. difficile detected.  Final     Time coordinating discharge: Over 30 minutes  SIGNED:   Erick Blinks, MD  [facility] 12/28/2016, 1:07 PM Pager   If 7PM-7AM, please contact night-coverage [email or web address] Password [email or web address]

## 2017-01-03 LAB — CULTURE, BLOOD (ROUTINE X 2)
Culture: NO GROWTH
Culture: NO GROWTH

## 2017-01-03 IMAGING — DX DG CHEST 2V
2 series · 2 of 2 positions shown · non-contrast
Comparison: 10/20/2016, 12/18/2013

CLINICAL DATA: 88-year-old female unresponsive at breakfast today.
Hypoxia. Initial encounter.

EXAM:
CHEST  2 VIEW

[chest lat]
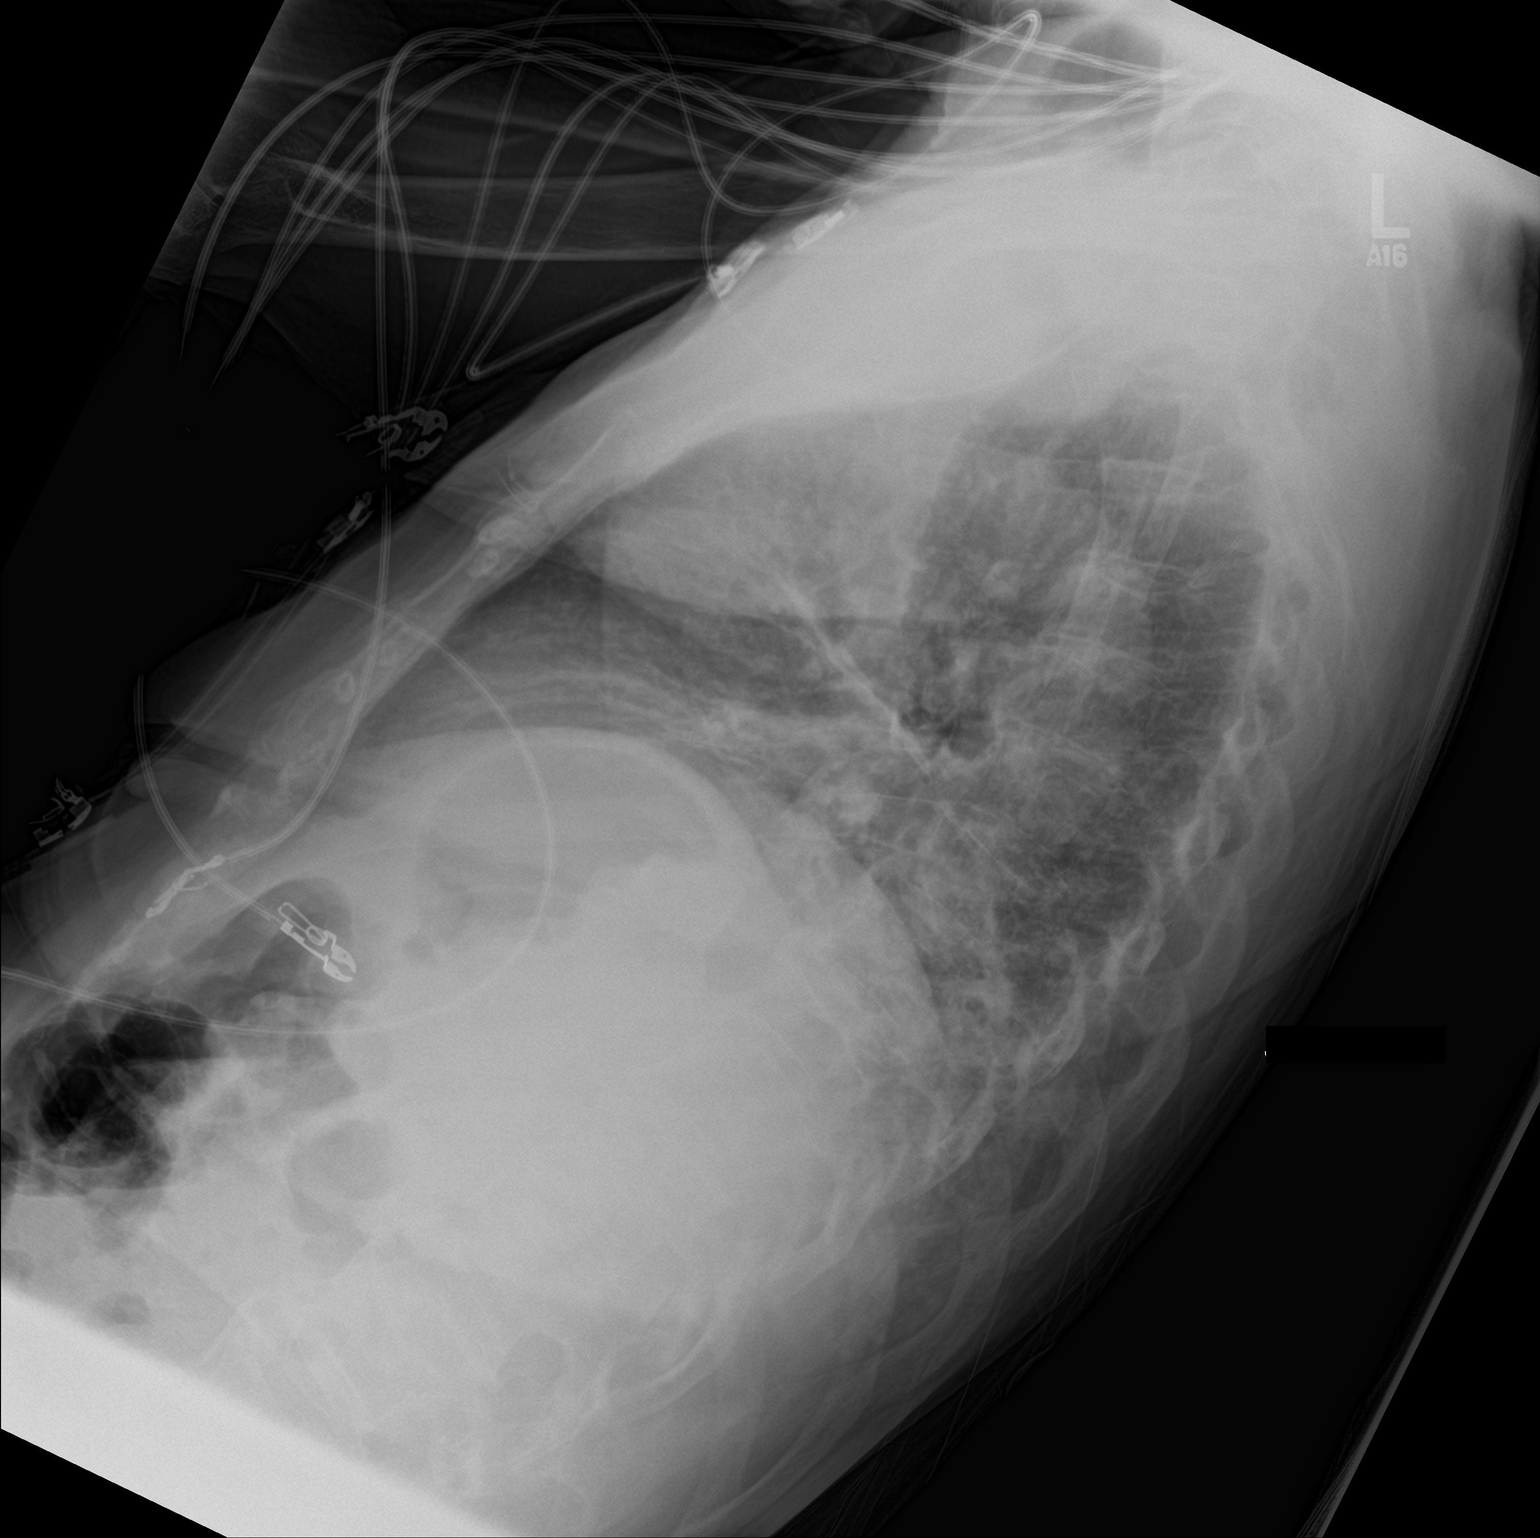

[chest ap]
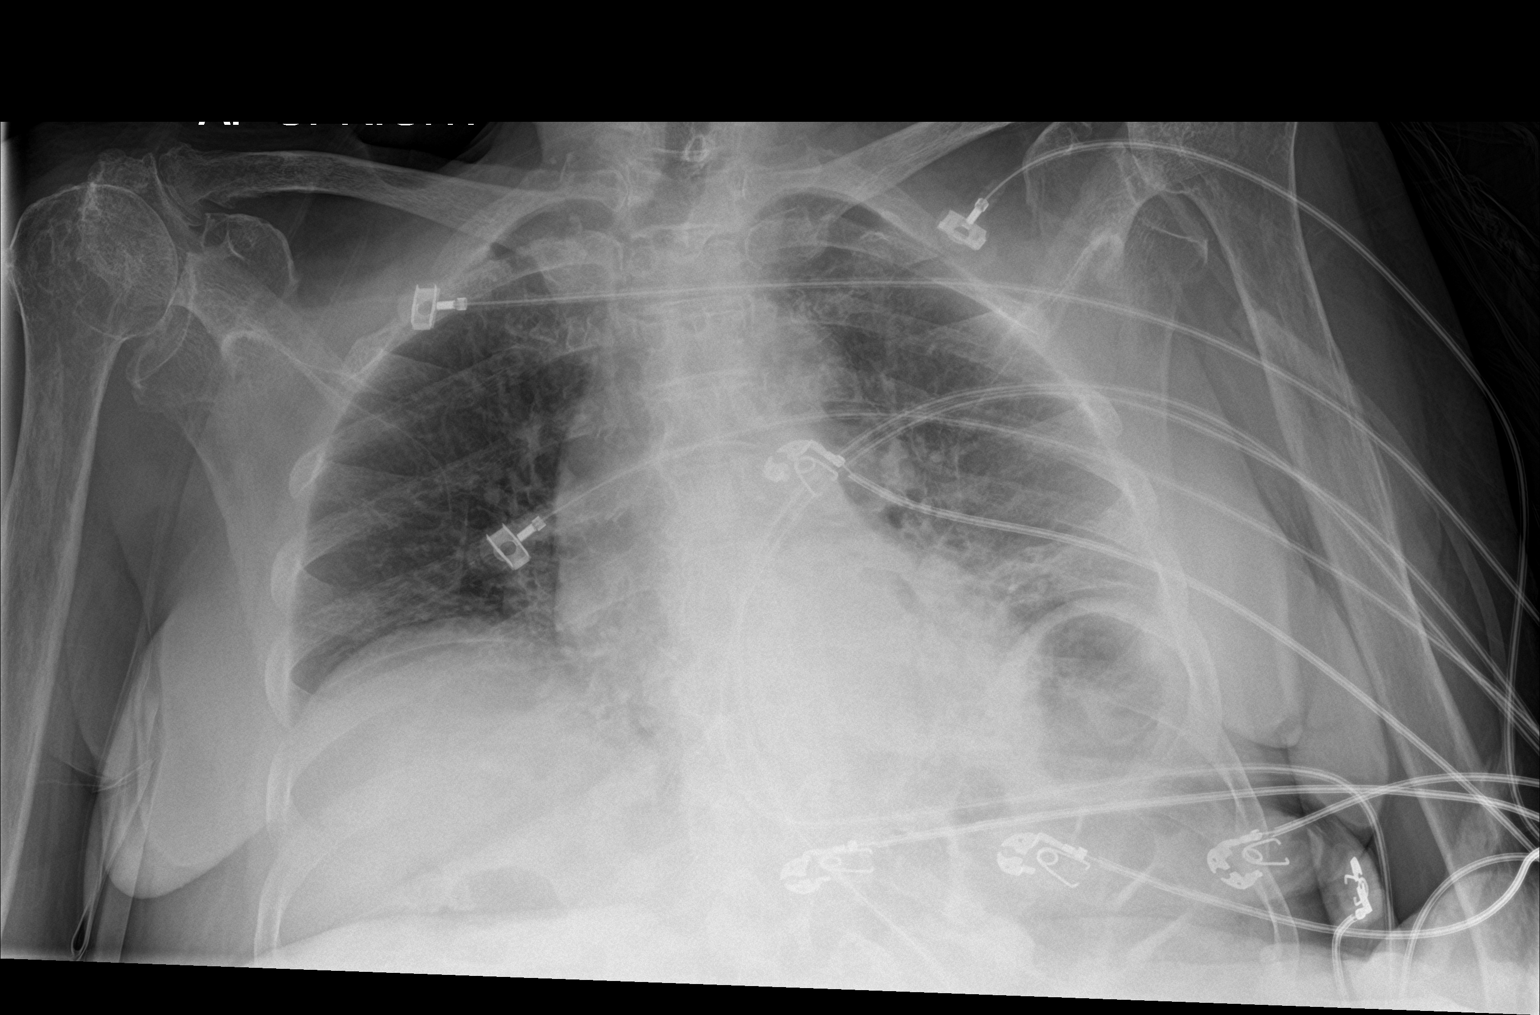

[2 of 2 positions shown; findings below may reference images not displayed]

FINDINGS: Seated upright AP and lateral views of the chest. Low lung volumes.
Chronic mild to moderate elevation of the right hemidiaphragm
appears stable. Confluent pulmonary opacity at the medial left lung
base, may involved the lingula and the lower lobe (arrows). No
definite pleural effusion. No pneumothorax or pulmonary edema.
Stable cardiac size and mediastinal contours. Visualized tracheal
air column is within normal limits. No acute osseous abnormality
identified.
IMPRESSION: Abnormal left lung base opacity suspicious for aspiration and/or
pneumonia in this clinical setting. No pleural effusion.

Continued low lung volumes.

Calcified aortic atherosclerosis.

## 2017-01-12 IMAGING — CR DG ABD PORTABLE 1V
1 series · 1 of 1 positions shown · non-contrast
Comparison: 11/03/2016

CLINICAL DATA: Small bowel obstruction.

EXAM:
PORTABLE ABDOMEN - 1 VIEW

[supine ap]
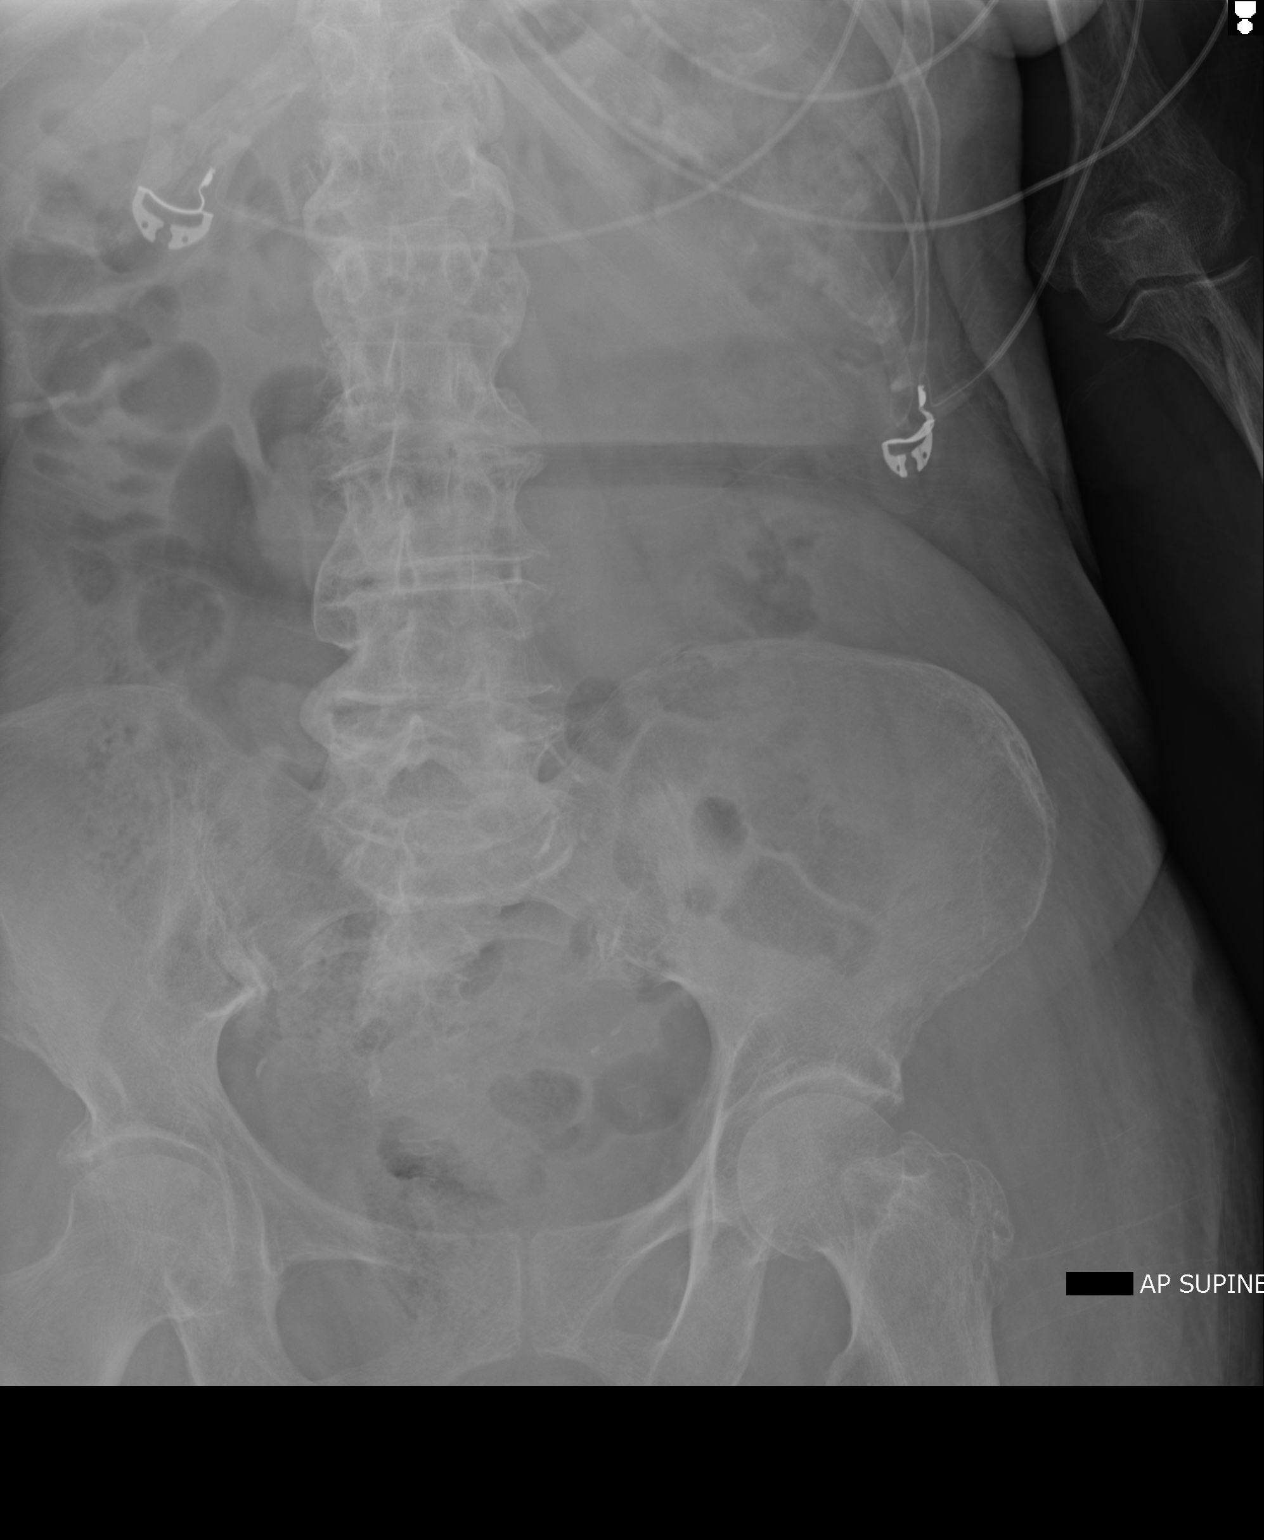

[1 of 1 positions shown; findings below may reference images not displayed]

FINDINGS: There is decreased gaseous distention of small and large bowel loops
compared to the prior study. Gas is currently present in scattered
loops of nondilated small and large bowel without evidence of
obstruction. The right lateral abdomen was incompletely imaged.
Evaluation for intraperitoneal free air is limited on this supine
study. Diffuse lumbar disc degeneration is noted.
IMPRESSION: Nonobstructed bowel gas pattern.

## 2017-02-25 IMAGING — CR DG CHEST 1V PORT
1 series · 1 of 1 positions shown · non-contrast
Comparison: 11/07/2016

CLINICAL DATA: Aortic atherosclerosis.

EXAM:
PORTABLE CHEST 1 VIEW

[portable]
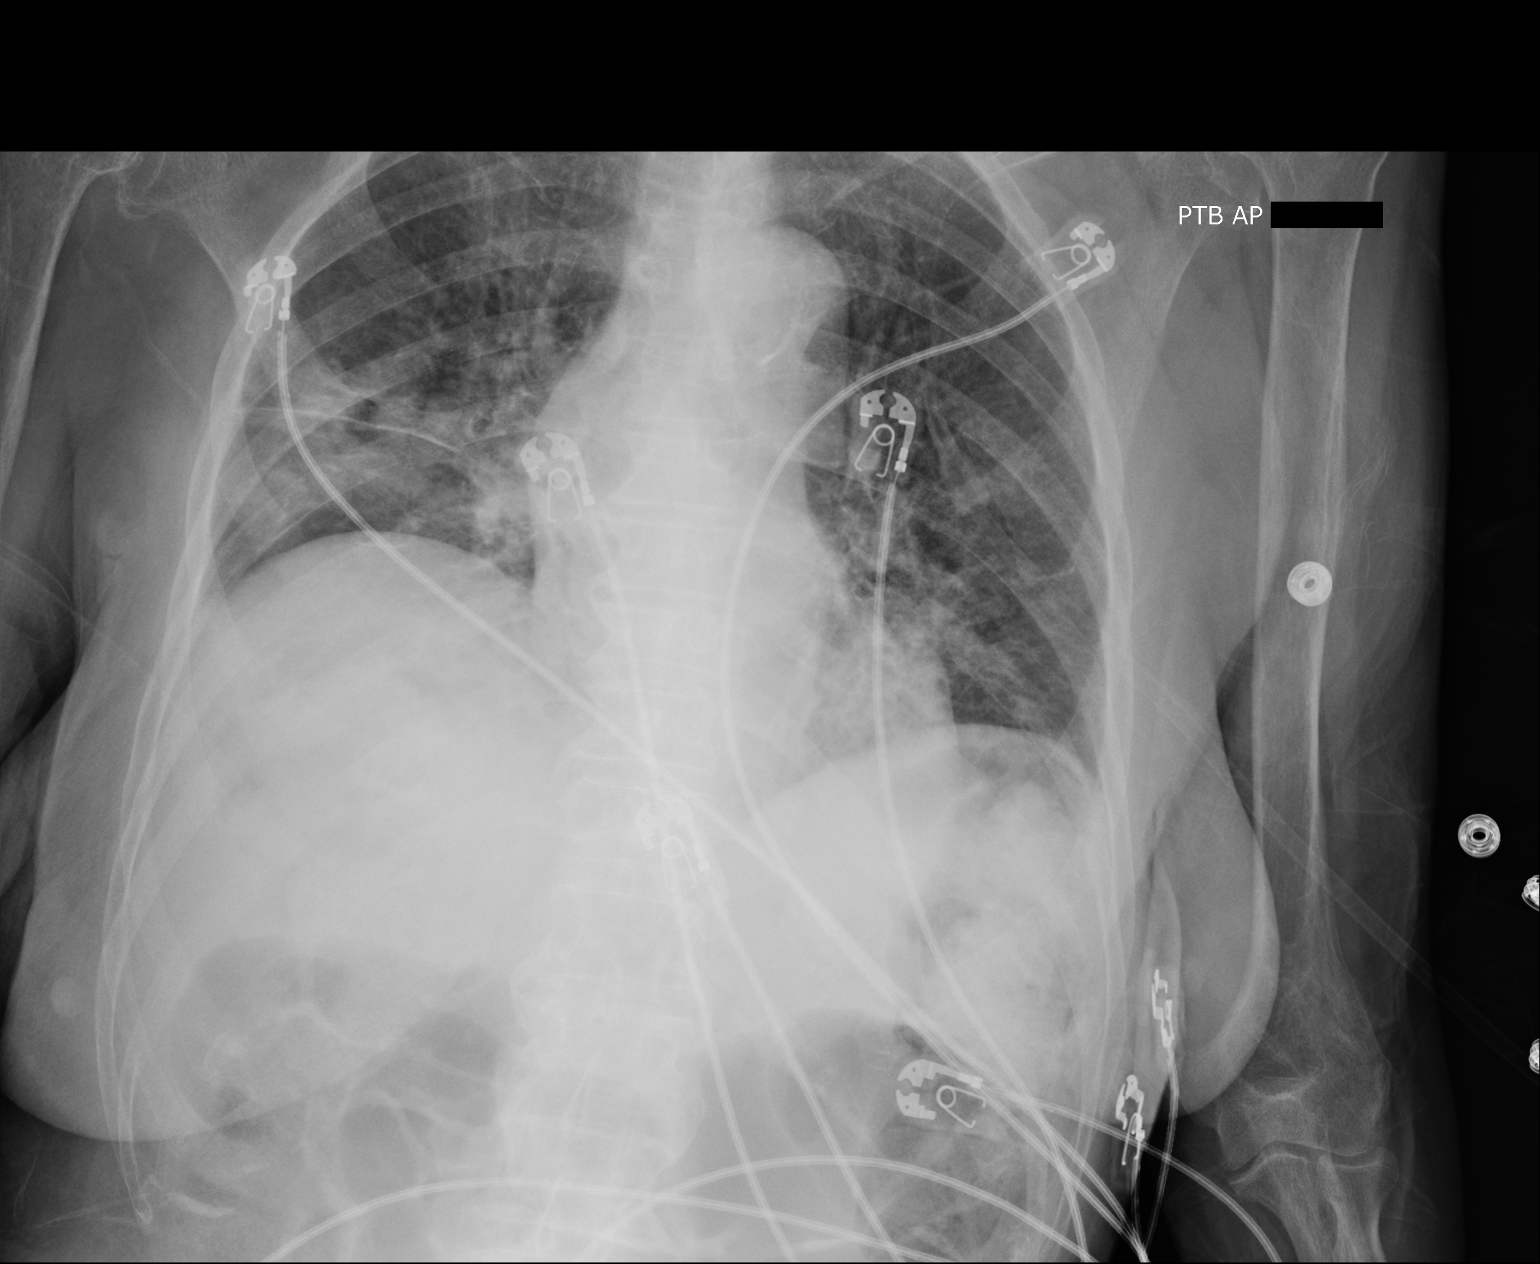

[1 of 1 positions shown; findings below may reference images not displayed]

FINDINGS: Lungs are hypoinflated with heterogeneous airspace opacification
over the right mid to lower lung and left base likely pneumonia. No
evidence of effusion. Cardiomediastinal silhouette is within normal.
There is minimal calcified plaque over the aortic arch. There are
mild degenerative changes of the spine.
IMPRESSION: Multifocal airspace process over the mid to lower lungs right worse
than left likely pneumonia.

## 2017-02-26 IMAGING — CR DG CHEST 1V PORT
1 series · 1 of 1 positions shown · non-contrast
Comparison: Single-view of the chest 12/18/2016 and 11/07/2016.

CLINICAL DATA: Sepsis and pneumonia.

EXAM:
PORTABLE CHEST 1 VIEW

[ap portable]
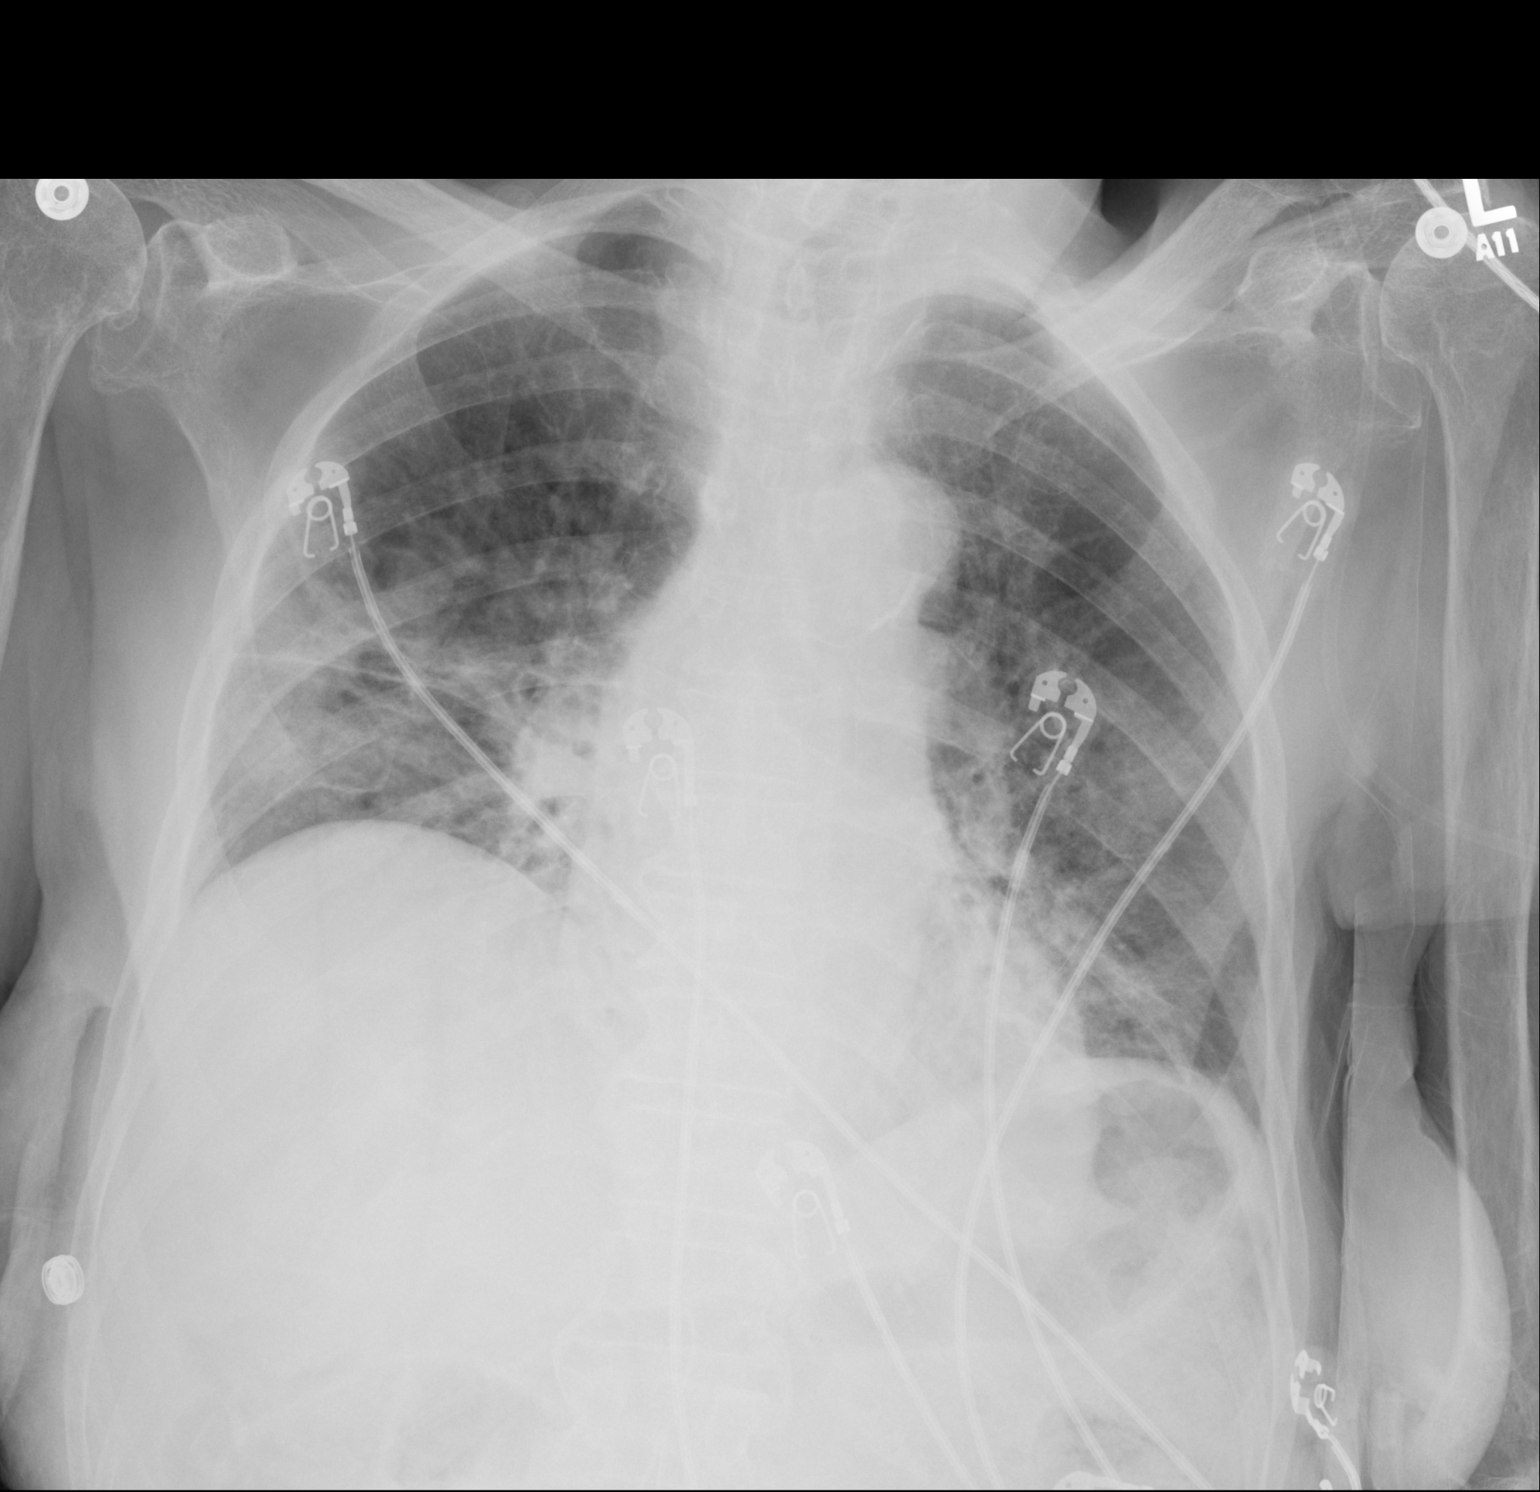

[1 of 1 positions shown; findings below may reference images not displayed]

FINDINGS: Bilateral mid and lower lung zone airspace disease does not appear
changed compared to the most recent examination. Heart size is
normal. No pneumothorax. Aortic atherosclerosis noted.
IMPRESSION: No marked change in bilateral airspace disease most consistent with
pneumonia.

Atherosclerosis.

## 2018-07-22 DEATH — deceased
